# Patient Record
Sex: Female | Born: 1990 | Race: Black or African American | Hispanic: No | Marital: Single | Smoking: Never smoker
Health system: Southern US, Community
[De-identification: ages and names within clinical notes are randomized; demographics above are authoritative.]

## PROBLEM LIST (undated history)

## (undated) ENCOUNTER — Inpatient Hospital Stay (HOSPITAL_COMMUNITY): Payer: Self-pay

## (undated) DIAGNOSIS — I2699 Other pulmonary embolism without acute cor pulmonale: Secondary | ICD-10-CM

## (undated) DIAGNOSIS — O99891 Other specified diseases and conditions complicating pregnancy: Secondary | ICD-10-CM

## (undated) DIAGNOSIS — B019 Varicella without complication: Secondary | ICD-10-CM

## (undated) DIAGNOSIS — D649 Anemia, unspecified: Secondary | ICD-10-CM

## (undated) DIAGNOSIS — O9989 Other specified diseases and conditions complicating pregnancy, childbirth and the puerperium: Secondary | ICD-10-CM

## (undated) HISTORY — PX: NO PAST SURGERIES: SHX2092

## (undated) HISTORY — DX: Varicella without complication: B01.9

---
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
## 2012-04-10 LAB — OB RESULTS CONSOLE ABO/RH: RH Type: NEGATIVE

## 2012-04-10 LAB — OB RESULTS CONSOLE RUBELLA ANTIBODY, IGM: Rubella: IMMUNE

## 2012-04-10 LAB — OB RESULTS CONSOLE RPR: RPR: NONREACTIVE

## 2012-04-10 LAB — OB RESULTS CONSOLE HIV ANTIBODY (ROUTINE TESTING): HIV: NONREACTIVE

## 2012-04-10 LAB — OB RESULTS CONSOLE HEPATITIS B SURFACE ANTIGEN: Hepatitis B Surface Ag: NEGATIVE

## 2012-10-22 NOTE — L&D Delivery Note (Signed)
Delivery Note At 4:50 AM a viable and healthy female was delivered via Vaginal, Spontaneous Delivery (Presentation: Left Occiput ;Anterior  ).  APGAR: 8, 9; weight 8# 4.6.   Placenta status: Umbilical cord avulsion, velamentous cord insertion, manual extraction of the placenta. Cord:  3V with a velamentous cord insertion and loose nuchal cord x 2.  The patient began to have a increase in her temperature around midnight. At that point she was 100.3 and she received 1 g of Tylenol by mouth x1. Ampicillin 2 g IV x1 was also started at this time. At around 3 in the morning the patient spiked a temperature to 100.7 and she received gentamicin. See her thereafter she was found to be completely dilated and pushing commenced. The patient delivered a vigorous female infant in the vertex LOA presentation. With delivery of the infant's head a loose double nuchal cord was noted. The infant's body was delivered and the nuchal cord x2 was reduced after delivery. The infant cried vigorously after delivery and was placed on the maternal abdomen. The cord was clamped and cut x2. At this time attention was turned to delivery of the placenta. The cord blood was obtained. Gentle traction was placed on the umbilical cord and it was noted to easily avulse from the placenta. At this time the infant was passed to the isolette, and the body of the placenta was manually removed. The patient was noted to have some continued bleeding after manual extraction of the placenta. The uterus was massaged And clot was removed from the lower uterine segment. Given the persistent bleeding the uterus was reexplored x2 and complete removal of all placental tissue was confirmed. These misoprostol 1000 mcg was called for and placed rectally. The patient was noted to have persistent bleeding the uterus felt firm however with massage she continued to have bleeding. Hemabate was called for and administered. A stat CBC and type and cross were called for. Right  angle retractors were called for and the vagina was explored. The patient had bilateral labial lacerations which were hemostatic. No cervical lacerations were identified. She did have bilateral vaginal lacerations which were initially hemostatic, however did begin to have have some bleeding and these were repaired with 3-0 Vicryl in running fashion. In spite of these interventions the patient continued to have bleeding and a code hemorrhage was called. The usual protocol for hemorrhage was followed. The Bakri balloon was placed within the uterus and the balloon was inflated to 420 cc. The patient had persistent oozing from the vagina at this point and it was felt that this was multifocal in nature and the vagina was packed with Kerlex moistened with sterile saline. Total EBL was approximately 1000 cc. The patient's bleeding is stable at this time. She will be transferred to the AICU For continued care.  Anesthesia: Epidural  Episiotomy: None Lacerations: Bilateral labial lacerations, bilateral vaginal laceration Suture Repair: 3.0 vicryl Est. Blood Loss (mL): 1000cc  Mom to AICU.  Baby to nursery-stable.  Rachel Sanders. 11/02/2012, 6:24 AM

## 2012-10-31 ENCOUNTER — Encounter (HOSPITAL_COMMUNITY): Payer: Self-pay

## 2012-10-31 ENCOUNTER — Inpatient Hospital Stay (HOSPITAL_COMMUNITY)
Admission: AD | Admit: 2012-10-31 | Discharge: 2012-11-04 | DRG: 774 | Disposition: A | Discharge status: Home / Self Care | Payer: Medicaid Other | Source: Ambulatory Visit | Attending: Obstetrics and Gynecology | Admitting: Obstetrics and Gynecology

## 2012-10-31 DIAGNOSIS — IMO0002 Reserved for concepts with insufficient information to code with codable children: Secondary | ICD-10-CM | POA: Diagnosis present

## 2012-10-31 DIAGNOSIS — O9903 Anemia complicating the puerperium: Secondary | ICD-10-CM | POA: Diagnosis not present

## 2012-10-31 DIAGNOSIS — D649 Anemia, unspecified: Secondary | ICD-10-CM | POA: Diagnosis not present

## 2012-10-31 LAB — CBC
Hemoglobin: 9.2 g/dL — ABNORMAL LOW (ref 12.0–15.0)
MCH: 27.9 pg (ref 26.0–34.0)
MCHC: 32.7 g/dL (ref 30.0–36.0)
Platelets: 391 10*3/uL (ref 150–400)

## 2012-10-31 LAB — COMPREHENSIVE METABOLIC PANEL
Albumin: 3.2 g/dL — ABNORMAL LOW (ref 3.5–5.2)
Alkaline Phosphatase: 234 U/L — ABNORMAL HIGH (ref 39–117)
BUN: 5 mg/dL — ABNORMAL LOW (ref 6–23)
Chloride: 103 mEq/L (ref 96–112)
Creatinine, Ser: 0.59 mg/dL (ref 0.50–1.10)
GFR calc Af Amer: >90 mL/min (ref >90)
Glucose, Bld: 70 mg/dL (ref 70–99)
Sodium: 135 mEq/L (ref 135–145)
Total Bilirubin: 0.4 mg/dL (ref 0.3–1.2)
Total Protein: 6.6 g/dL (ref 6.0–8.3)

## 2012-10-31 LAB — URIC ACID: Uric Acid, Serum: 3.5 mg/dL (ref 2.4–7.0)

## 2012-10-31 LAB — LACTATE DEHYDROGENASE: LDH: 180 U/L (ref 94–250)

## 2012-10-31 LAB — POCT FERN TEST: POCT Fern Test: POSITIVE

## 2012-10-31 MED ORDER — OXYTOCIN 40 UNITS IN LACTATED RINGERS INFUSION - SIMPLE MED
62.5000 mL/h | INTRAVENOUS | Status: DC
  Administered 2012-11-02: 62.5 mL/h via INTRAVENOUS
  Filled 2012-10-31 (×2): qty 1000

## 2012-10-31 MED ORDER — LACTATED RINGERS IV SOLN
500.0000 mL | INTRAVENOUS | Status: DC | PRN
  Administered 2012-11-01 – 2012-11-02 (×2): 500 mL via INTRAVENOUS

## 2012-10-31 MED ORDER — EPHEDRINE 5 MG/ML INJ: 10.0000 mg | INTRAVENOUS | Status: DC | PRN

## 2012-10-31 MED ORDER — PHENYLEPHRINE 40 MCG/ML (10ML) SYRINGE FOR IV PUSH (FOR BLOOD PRESSURE SUPPORT)
80.0000 ug | PREFILLED_SYRINGE | INTRAVENOUS | Status: DC | PRN
  Filled 2012-10-31: qty 5

## 2012-10-31 MED ORDER — FENTANYL 2.5 MCG/ML BUPIVACAINE 1/10 % EPIDURAL INFUSION (WH - ANES)
14.0000 mL/h | INTRAMUSCULAR | Status: DC
  Administered 2012-11-01 – 2012-11-02 (×2): 14 mL/h via EPIDURAL
  Filled 2012-10-31 (×3): qty 125

## 2012-10-31 MED ORDER — DIPHENHYDRAMINE HCL 50 MG/ML IJ SOLN: 12.5000 mg | INTRAMUSCULAR | Status: DC | PRN

## 2012-10-31 MED ORDER — OXYCODONE-ACETAMINOPHEN 5-325 MG PO TABS
1.0000 | ORAL_TABLET | ORAL | Status: DC | PRN
  Filled 2012-10-31: qty 2

## 2012-10-31 MED ORDER — OXYTOCIN BOLUS FROM INFUSION: 500.0000 mL | INTRAVENOUS | Status: DC

## 2012-10-31 MED ORDER — LACTATED RINGERS IV SOLN
INTRAVENOUS | Status: DC
  Administered 2012-10-31 – 2012-11-02 (×6): via INTRAVENOUS

## 2012-10-31 MED ORDER — LACTATED RINGERS IV SOLN
500.0000 mL | Freq: Once | INTRAVENOUS | Status: AC
  Administered 2012-11-01: 15:00:00 via INTRAVENOUS

## 2012-10-31 MED ORDER — CITRIC ACID-SODIUM CITRATE 334-500 MG/5ML PO SOLN: 30.0000 mL | ORAL | Status: DC | PRN

## 2012-10-31 MED ORDER — PHENYLEPHRINE 40 MCG/ML (10ML) SYRINGE FOR IV PUSH (FOR BLOOD PRESSURE SUPPORT): 80.0000 ug | PREFILLED_SYRINGE | INTRAVENOUS | Status: DC | PRN

## 2012-10-31 MED ORDER — BUTORPHANOL TARTRATE 1 MG/ML IJ SOLN
1.0000 mg | INTRAMUSCULAR | Status: DC | PRN
  Administered 2012-11-01: 1 mg via INTRAVENOUS
  Filled 2012-10-31: qty 1

## 2012-10-31 MED ORDER — ONDANSETRON HCL 4 MG/2ML IJ SOLN
4.0000 mg | Freq: Four times a day (QID) | INTRAMUSCULAR | Status: DC | PRN
  Filled 2012-10-31: qty 2

## 2012-10-31 MED ORDER — LIDOCAINE HCL (PF) 1 % IJ SOLN
30.0000 mL | INTRAMUSCULAR | Status: DC | PRN
  Administered 2012-11-02: 30 mL via SUBCUTANEOUS
  Filled 2012-10-31: qty 30

## 2012-10-31 MED ORDER — IBUPROFEN 600 MG PO TABS: 600.0000 mg | ORAL_TABLET | Freq: Four times a day (QID) | ORAL | Status: DC | PRN

## 2012-10-31 MED ORDER — ACETAMINOPHEN 325 MG PO TABS
650.0000 mg | ORAL_TABLET | ORAL | Status: DC | PRN
  Administered 2012-10-31: 650 mg via ORAL
  Filled 2012-10-31: qty 2

## 2012-10-31 MED ORDER — EPHEDRINE 5 MG/ML INJ
10.0000 mg | INTRAVENOUS | Status: DC | PRN
  Filled 2012-10-31: qty 4

## 2012-10-31 MED ORDER — OXYTOCIN 40 UNITS IN LACTATED RINGERS INFUSION - SIMPLE MED
1.0000 m[IU]/min | INTRAVENOUS | Status: DC
  Administered 2012-10-31: 2 m[IU]/min via INTRAVENOUS
  Filled 2012-10-31: qty 1000

## 2012-10-31 MED ORDER — ZOLPIDEM TARTRATE 5 MG PO TABS: 5.0000 mg | ORAL_TABLET | Freq: Every evening | ORAL | Status: DC | PRN

## 2012-10-31 MED ORDER — TERBUTALINE SULFATE 1 MG/ML IJ SOLN: 0.2500 mg | Freq: Once | INTRAMUSCULAR | Status: AC | PRN

## 2012-10-31 NOTE — MAU Note (Signed)
Patient states she started leaking a lot of clear fluid at 1500. States she started leaking a little for a couple of days. Started having contractions every 4 minutes. Reports good fetal movement.

## 2012-10-31 NOTE — H&P (Signed)
Rachel Sanders is a 22 y.o. female presenting for leaking fluid  22 yo G1 P0 @ 40+2 p/w c/o leaking fluid and was confirmed spontaneously ruptured.  Her pregnancy has been uncomplicated to this point.  Her cervix is 2/60/-2.  Her BPs have been elevated in MAU and pre-eclampsia labs will be checked.  History OB History    Grav Para Term Preterm Abortions TAB SAB Ect Mult Living   1 0 0 0 0 0 0 0 0 0      History reviewed. No pertinent past medical history. Past Surgical History  Procedure Date  . No past surgeries    Family History: family history is not on file. Social History:  reports that she has never smoked. She has never used smokeless tobacco. She reports that she does not drink alcohol or use illicit drugs.   Prenatal Transfer Tool  Maternal Diabetes: No Genetic Screening: Normal Maternal Ultrasounds/Referrals: Normal Fetal Ultrasounds or other Referrals:  None Maternal Substance Abuse:  No Significant Maternal Medications:  None Significant Maternal Lab Results:  None Other Comments:  None  ROS: as above  Dilation: 2.5 Effacement (%): 60 Station: -3 Exam by:: S. Carrera, RNC Blood pressure 139/94, pulse 102, temperature 98.4 F (36.9 C), temperature source Oral, resp. rate 16, height 5\' 6"  (1.676 m), weight 76.839 kg (169 lb 6.4 oz). Exam Physical Exam  Prenatal labs: ABO, Rh:  A negative Antibody:  Negative Rubella:  Immune RPR:   NR HBsAg:   Neg HIV:   NR GBS: Negative (12/09 0000)   Assessment/Plan: 1) Admit 2) Check Pre-eclampsia labs for elevated BPs 3) Epidural on request 4) Augmentation with pitocin if no cervical change  Rachel Sanders H. 10/31/2012, 7:14 PM

## 2012-10-31 NOTE — MAU Provider Note (Signed)
Rachel Sanders is a 22 y.o. G1 female at [redacted]w[redacted]d who presents to MAU complaining of leaking fluid.   Performed a sterile speculum exam on patient at the request of Bobbe Medico, RN in MAU.  Minimal pooling noted. Small amount of mucus. No blood. Cervix clean.  Sample taken to evaluate for ferning under microscope.  + Crist Fat noted.  Report given to Bobbe Medico, RN in MAU to discuss with Dr. Tenny Craw.   Freddi Starr, PA-C 10/31/2012 7:12 PM

## 2012-11-01 ENCOUNTER — Inpatient Hospital Stay (HOSPITAL_COMMUNITY): Payer: Medicaid Other | Admitting: Anesthesiology

## 2012-11-01 ENCOUNTER — Encounter (HOSPITAL_COMMUNITY): Payer: Self-pay | Admitting: Anesthesiology

## 2012-11-01 LAB — CBC
MCH: 27.6 pg (ref 26.0–34.0)
MCHC: 32.4 g/dL (ref 30.0–36.0)
MCV: 85.4 fL (ref 78.0–100.0)
Platelets: 348 10*3/uL (ref 150–400)
RDW: 13.9 % (ref 11.5–15.5)

## 2012-11-01 MED ORDER — SODIUM CHLORIDE 0.9 % IV SOLN
2.0000 g | Freq: Once | INTRAVENOUS | Status: AC
  Administered 2012-11-01: 2 g via INTRAVENOUS
  Filled 2012-11-01: qty 2000

## 2012-11-01 MED ORDER — ACETAMINOPHEN 500 MG PO TABS
1000.0000 mg | ORAL_TABLET | Freq: Once | ORAL | Status: AC
  Administered 2012-11-01: 1000 mg via ORAL
  Filled 2012-11-01: qty 2

## 2012-11-01 MED ORDER — SODIUM CHLORIDE 0.9 % IV SOLN
1.0000 g | Freq: Four times a day (QID) | INTRAVENOUS | Status: DC
  Filled 2012-11-01 (×2): qty 1000

## 2012-11-01 MED ORDER — FENTANYL 2.5 MCG/ML BUPIVACAINE 1/10 % EPIDURAL INFUSION (WH - ANES)
INTRAMUSCULAR | Status: DC | PRN
  Administered 2012-11-01: 14 mL/h via EPIDURAL

## 2012-11-01 MED ORDER — LIDOCAINE HCL (PF) 1 % IJ SOLN
INTRAMUSCULAR | Status: DC | PRN
  Administered 2012-11-01 (×2): 4 mL

## 2012-11-01 NOTE — Progress Notes (Signed)
Patient ID: Rachel Sanders, female   DOB: 1991/05/26, 22 y.o.   MRN: [id number]   S: Pt comfortable without epidural.  Has had light trickling of fluid overnight.  Pitocin has been on/off throughout night due to tachysystole.   OCeasar Mons Vitals:   11/01/12 7564 11/01/12 0732 11/01/12 0803 11/01/12 0901  BP: 122/83 128/80 156/87   Pulse: 87 89 77   Temp:  98.1 F (36.7 C)    TempSrc:  Oral    Resp: 18 18 18 18   Height:      Weight:       AOx3, NAD Gravid soft NT FHR 140s reactive cvx 2-3/70/-2 toco irregular q 2-5  AROM clear fluid, IUPC placed  A/P 1) Pt now with IUPC. Ctxns every 2-3 minutes.  Will monitor for adequacy.  FWB reasssuring.  Pitocin currently at 20 2) Elevated BPs.  Pts has had some labile BPs.  Higher when she's sitting up.  Pre-e labs on admission were all WNL. 2nd CBC pending. No severe range BPs.  No intervention required at this time

## 2012-11-01 NOTE — Anesthesia Preprocedure Evaluation (Signed)

## 2012-11-01 NOTE — Progress Notes (Signed)
Patient ID: Rachel Sanders, female   DOB: [birth year], 22 y.o.   MRN: [id number]   CTSP for a prolonged deceleration.  FHTs down 7 minutes when I received the call.  Pitocin off, IVF bolus being given, supplemental O2 on. Upon arrival FHTs 120s.  Cervix exam 6/80/-1/0 station.  IUPC completely out.  IUPC replaced and FSE placed. FHR now 120-130s and reactive.  Toco Q7 minutes Pt was on 32 of pitocin prior to deceleration.  Will restart at 6 at 20:00 Pt with epidural

## 2012-11-01 NOTE — Anesthesia Procedure Notes (Signed)
Epidural Patient location during procedure: OB Start time: 11/01/2012 3:48 PM  Staffing Anesthesiologist: Aveer Bartow A. Performed by: anesthesiologist   Preanesthetic Checklist Completed: patient identified, site marked, surgical consent, pre-op evaluation, timeout performed, IV checked, risks and benefits discussed and monitors and equipment checked  Epidural Patient position: sitting Prep: site prepped and draped and DuraPrep Patient monitoring: continuous pulse ox and blood pressure Approach: midline Injection technique: LOR air  Needle:  Needle type: Tuohy  Needle gauge: 17 G Needle length: 9 cm and 9 Needle insertion depth: 4 cm Catheter type: closed end flexible Catheter size: 19 Gauge Catheter at skin depth: 9 cm Test dose: negative and Other  Assessment Events: blood not aspirated, injection not painful, no injection resistance, negative IV test and no paresthesia  Additional Notes Patient identified. Risks and benefits discussed including failed block, incomplete  Pain control, post dural puncture headache, nerve damage, paralysis, blood pressure Changes, nausea, vomiting, reactions to medications-both toxic and allergic and post Partum back pain. All questions were answered. Patient expressed understanding and wished to proceed. Sterile technique was used throughout procedure. Epidural site was Dressed with sterile barrier dressing. No paresthesias, signs of intravascular injection Or signs of intrathecal spread were encountered.  Patient was more comfortable after the epidural was dosed. Please see RN's note for documentation of vital signs and FHR which are stable.

## 2012-11-02 ENCOUNTER — Encounter (HOSPITAL_COMMUNITY): Payer: Self-pay | Admitting: Obstetrics

## 2012-11-02 ENCOUNTER — Encounter (HOSPITAL_COMMUNITY)
Admission: AD | Disposition: A | Discharge status: Home / Self Care | Payer: Self-pay | Source: Ambulatory Visit | Attending: Obstetrics and Gynecology

## 2012-11-02 LAB — CBC WITH DIFFERENTIAL/PLATELET
Basophils Absolute: 0 10*3/uL (ref 0.0–0.1)
Eosinophils Relative: 0 % (ref 0–5)
HCT: 16.8 % — ABNORMAL LOW (ref 36.0–46.0)
Lymphocytes Relative: 9 % — ABNORMAL LOW (ref 12–46)
Lymphs Abs: 2.8 10*3/uL (ref 0.7–4.0)
MCV: 85.3 fL (ref 78.0–100.0)
Neutro Abs: 25.7 10*3/uL — ABNORMAL HIGH (ref 1.7–7.7)
Platelets: 156 10*3/uL (ref 150–400)
RBC: 1.97 MIL/uL — ABNORMAL LOW (ref 3.87–5.11)
WBC: 30.7 10*3/uL — ABNORMAL HIGH (ref 4.0–10.5)

## 2012-11-02 LAB — MRSA PCR SCREENING: MRSA by PCR: NEGATIVE

## 2012-11-02 LAB — CBC
HCT: 22.4 % — ABNORMAL LOW (ref 36.0–46.0)
HCT: 19.7 % — ABNORMAL LOW (ref 36.0–46.0)
Hemoglobin: 6.7 g/dL — CL (ref 12.0–15.0)
MCHC: 33.5 g/dL (ref 30.0–36.0)
MCV: 83.8 fL (ref 78.0–100.0)
Platelets: 312 10*3/uL (ref 150–400)
RBC: 2.35 MIL/uL — ABNORMAL LOW (ref 3.87–5.11)
RDW: 13.9 % (ref 11.5–15.5)
RDW: 14.6 % (ref 11.5–15.5)
WBC: 28.6 10*3/uL — ABNORMAL HIGH (ref 4.0–10.5)
WBC: 31.2 10*3/uL — ABNORMAL HIGH (ref 4.0–10.5)

## 2012-11-02 LAB — COMPREHENSIVE METABOLIC PANEL
ALT: 11 U/L (ref 0–35)
Albumin: 1.9 g/dL — ABNORMAL LOW (ref 3.5–5.2)
Alkaline Phosphatase: 106 U/L (ref 39–117)
Calcium: 7.9 mg/dL — ABNORMAL LOW (ref 8.4–10.5)
Potassium: 3.8 mEq/L (ref 3.5–5.1)
Sodium: 133 mEq/L — ABNORMAL LOW (ref 135–145)
Total Protein: 3.9 g/dL — ABNORMAL LOW (ref 6.0–8.3)

## 2012-11-02 LAB — APTT: aPTT: 35 seconds (ref 24–37)

## 2012-11-02 LAB — PROTIME-INR: INR: 1.33 (ref 0.00–1.49)

## 2012-11-02 SURGERY — DILATION AND EVACUATION, UTERUS
Anesthesia: Choice

## 2012-11-02 MED ORDER — SODIUM BICARBONATE 8.4 % IV SOLN
INTRAVENOUS | Status: DC | PRN
  Administered 2012-11-02: 5 mL via EPIDURAL

## 2012-11-02 MED ORDER — WITCH HAZEL-GLYCERIN EX PADS: 1.0000 "application " | MEDICATED_PAD | CUTANEOUS | Status: DC | PRN

## 2012-11-02 MED ORDER — MISOPROSTOL 200 MCG PO TABS
1000.0000 ug | ORAL_TABLET | Freq: Once | ORAL | Status: AC
  Administered 2012-11-02: 1000 ug via RECTAL

## 2012-11-02 MED ORDER — OXYTOCIN 40 UNITS IN LACTATED RINGERS INFUSION - SIMPLE MED: 62.5000 mL/h | INTRAVENOUS | Status: DC

## 2012-11-02 MED ORDER — BENZOCAINE-MENTHOL 20-0.5 % EX AERO
1.0000 "application " | INHALATION_SPRAY | CUTANEOUS | Status: DC | PRN
  Administered 2012-11-02: 1 via TOPICAL
  Filled 2012-11-02: qty 56

## 2012-11-02 MED ORDER — ONDANSETRON HCL 4 MG/2ML IJ SOLN: 4.0000 mg | INTRAMUSCULAR | Status: DC | PRN

## 2012-11-02 MED ORDER — SODIUM CHLORIDE 0.9 % IJ SOLN
3.0000 mL | INTRAMUSCULAR | Status: DC | PRN
  Administered 2012-11-04: 3 mL via INTRAVENOUS

## 2012-11-02 MED ORDER — SODIUM CHLORIDE 0.9 % IV SOLN: 250.0000 mL | INTRAVENOUS | Status: DC | PRN

## 2012-11-02 MED ORDER — PRENATAL MULTIVITAMIN CH
1.0000 | ORAL_TABLET | Freq: Every day | ORAL | Status: DC
  Administered 2012-11-02 – 2012-11-04 (×3): 1 via ORAL
  Filled 2012-11-02 (×2): qty 1

## 2012-11-02 MED ORDER — CARBOPROST TROMETHAMINE 250 MCG/ML IM SOLN
250.0000 ug | Freq: Once | INTRAMUSCULAR | Status: AC
  Administered 2012-11-02: 250 ug via INTRAMUSCULAR

## 2012-11-02 MED ORDER — LACTATED RINGERS IV SOLN
INTRAVENOUS | Status: DC
  Administered 2012-11-02 – 2012-11-03 (×3): via INTRAVENOUS

## 2012-11-02 MED ORDER — ACETAMINOPHEN 500 MG PO TABS
1000.0000 mg | ORAL_TABLET | Freq: Four times a day (QID) | ORAL | Status: DC | PRN
  Administered 2012-11-02: 1000 mg via ORAL
  Filled 2012-11-02: qty 2

## 2012-11-02 MED ORDER — SENNOSIDES-DOCUSATE SODIUM 8.6-50 MG PO TABS
2.0000 | ORAL_TABLET | Freq: Every day | ORAL | Status: DC
  Administered 2012-11-02 – 2012-11-04 (×2): 2 via ORAL

## 2012-11-02 MED ORDER — IBUPROFEN 600 MG PO TABS
600.0000 mg | ORAL_TABLET | Freq: Four times a day (QID) | ORAL | Status: DC
  Administered 2012-11-02 – 2012-11-04 (×8): 600 mg via ORAL
  Filled 2012-11-02 (×9): qty 1

## 2012-11-02 MED ORDER — CARBOPROST TROMETHAMINE 250 MCG/ML IM SOLN
INTRAMUSCULAR | Status: AC
  Administered 2012-11-02: 250 ug via INTRAMUSCULAR
  Filled 2012-11-02: qty 1

## 2012-11-02 MED ORDER — LANOLIN HYDROUS EX OINT: TOPICAL_OINTMENT | CUTANEOUS | Status: DC | PRN

## 2012-11-02 MED ORDER — GENTAMICIN SULFATE 40 MG/ML IJ SOLN
200.0000 mg | Freq: Once | INTRAVENOUS | Status: AC
  Administered 2012-11-02: 200 mg via INTRAVENOUS
  Filled 2012-11-02: qty 5

## 2012-11-02 MED ORDER — ZOLPIDEM TARTRATE 5 MG PO TABS: 5.0000 mg | ORAL_TABLET | Freq: Every evening | ORAL | Status: DC | PRN

## 2012-11-02 MED ORDER — SODIUM CHLORIDE 0.9 % IJ SOLN
3.0000 mL | Freq: Two times a day (BID) | INTRAMUSCULAR | Status: DC
  Administered 2012-11-02: 3 mL via INTRAVENOUS

## 2012-11-02 MED ORDER — CEFAZOLIN SODIUM 1-5 GM-% IV SOLN
1.0000 g | Freq: Three times a day (TID) | INTRAVENOUS | Status: DC
  Administered 2012-11-02 – 2012-11-03 (×6): 1 g via INTRAVENOUS
  Filled 2012-11-02 (×9): qty 50

## 2012-11-02 MED ORDER — MISOPROSTOL 200 MCG PO TABS
ORAL_TABLET | ORAL | Status: AC
  Administered 2012-11-02: 1000 ug via RECTAL
  Filled 2012-11-02: qty 5

## 2012-11-02 MED ORDER — DIBUCAINE 1 % RE OINT: 1.0000 "application " | TOPICAL_OINTMENT | RECTAL | Status: DC | PRN

## 2012-11-02 MED ORDER — SIMETHICONE 80 MG PO CHEW: 80.0000 mg | CHEWABLE_TABLET | ORAL | Status: DC | PRN

## 2012-11-02 MED ORDER — LACTATED RINGERS IV BOLUS (SEPSIS)
500.0000 mL | Freq: Once | INTRAVENOUS | Status: AC
  Administered 2012-11-02: 500 mL via INTRAVENOUS

## 2012-11-02 MED ORDER — OXYCODONE-ACETAMINOPHEN 5-325 MG PO TABS
1.0000 | ORAL_TABLET | ORAL | Status: DC | PRN
  Administered 2012-11-02: 1 via ORAL
  Administered 2012-11-03 (×2): 2 via ORAL
  Filled 2012-11-02: qty 1
  Filled 2012-11-02 (×2): qty 2

## 2012-11-02 MED ORDER — ONDANSETRON HCL 4 MG PO TABS: 4.0000 mg | ORAL_TABLET | ORAL | Status: DC | PRN

## 2012-11-02 MED ORDER — OXYCODONE HCL 5 MG PO TABS
10.0000 mg | ORAL_TABLET | ORAL | Status: DC | PRN
  Administered 2012-11-02: 10 mg via ORAL
  Filled 2012-11-02: qty 2

## 2012-11-02 MED ORDER — TETANUS-DIPHTH-ACELL PERTUSSIS 5-2.5-18.5 LF-MCG/0.5 IM SUSP
0.5000 mL | Freq: Once | INTRAMUSCULAR | Status: AC
  Administered 2012-11-03: 0.5 mL via INTRAMUSCULAR
  Filled 2012-11-02: qty 0.5

## 2012-11-02 MED ORDER — GENTAMICIN SULFATE 40 MG/ML IJ SOLN
170.0000 mg | Freq: Three times a day (TID) | INTRAVENOUS | Status: AC
  Administered 2012-11-02 – 2012-11-03 (×3): 170 mg via INTRAVENOUS
  Filled 2012-11-02 (×3): qty 4.25

## 2012-11-02 MED ORDER — DIPHENHYDRAMINE HCL 25 MG PO CAPS: 25.0000 mg | ORAL_CAPSULE | Freq: Four times a day (QID) | ORAL | Status: DC | PRN

## 2012-11-02 NOTE — Anesthesia Postprocedure Evaluation (Signed)
  Anesthesia Post-op Note  Patient: Rachel Sanders  Procedure(s) Performed: * No procedures listed *  Patient Location: A-ICU  Anesthesia Type:Epidural  Level of Consciousness: awake  Airway and Oxygen Therapy: Patient Spontanous Breathing  Post-op Pain: mild  Post-op Assessment: Patient's Cardiovascular Status Stable and Respiratory Function Stable  Post-op Vital Signs: stable  Complications: No apparent anesthesia complications

## 2012-11-02 NOTE — Progress Notes (Signed)
ANTIBIOTIC CONSULT NOTE - INITIAL  Pharmacy Consult for gentamicin Indication: maternal fever  No Known Allergies  Patient Measurements: Height: 5\' 6"  (167.6 cm) Weight: 169 lb 6.4 oz (76.839 kg) IBW/kg (Calculated) : 59.3  Adjusted Body Weight: 65.1 kg  Vital Signs: Temp: 100.7 F (38.2 C) (01/12 0301) Temp src: Oral (01/12 0301) BP: 136/79 mmHg (01/12 0320) Pulse Rate: 128  (01/12 0320) Intake/Output from previous day: 01/11 0701 - 01/12 0700 In: 1700  Out: -  Intake/Output from this shift: Total I/O In: 1700 [Other:1700] Out: -   Labs:  Basename 11/01/12 1014 10/31/12 1934  WBC 10.0 10.1  HGB 8.9* 9.2*  PLT 348 391  LABCREA -- --  CREATININE -- 0.59   Estimated Creatinine Clearance: 118.4 ml/min (by C-G formula based on Cr of 0.59).    Microbiology: No results found for this or any previous visit (from the past 720 hour(s)).  Medical History: Past Medical History  Diagnosis Date  . No pertinent past medical history     Medications:  Scheduled:    . [COMPLETED] acetaminophen  1,000 mg Oral Once  . ampicillin (OMNIPEN) IV  1 g Intravenous Q6H  . [COMPLETED] ampicillin (OMNIPEN) IV  2 g Intravenous Once  . [COMPLETED] lactated ringers  500 mL Intravenous Once   Infusions:    . fentaNYL 2.5 mcg/ml w/bupivacaine 1/10% in NS epidural infusion ( total) 14 mL/hr (11/02/12 0211)  . lactated ringers 125 mL/hr at 11/02/12 0216  . oxytocin 40 units in LR 1000 mL    . oxytocin 40 units in LR 1000 mL    . oxytocin 40 units in LR 1000 mL 12 milli-units/min (11/01/12 2331)   Assessment: 22yr old term G1P0 now with maternal fever already on Ampicillin IV.  Cover for possible infection of pelvic origin - R/O chorioamnionitis  Goal of Therapy:  Desire peak gentamicin serum level 7-56mcg/ml and trough <9mcg/ml  Plan:  1. Loading dose gentamicin 200mg  x 1, then 2. Maintenance infusion 170mg  gentamicin IV q8h (if tx continued) 3. Will measure actual  serum levels if tx >48-72hr or as clinically needed  Scarlett Presto 11/02/2012,3:41 AM

## 2012-11-03 LAB — PREPARE FRESH FROZEN PLASMA: Unit division: 0

## 2012-11-03 LAB — CBC
Hemoglobin: 6.4 g/dL — CL (ref 12.0–15.0)
MCH: 29 pg (ref 26.0–34.0)
Platelets: 125 10*3/uL — ABNORMAL LOW (ref 150–400)
RBC: 2.21 MIL/uL — ABNORMAL LOW (ref 3.87–5.11)
WBC: 26.8 10*3/uL — ABNORMAL HIGH (ref 4.0–10.5)

## 2012-11-03 LAB — APTT: aPTT: 32 seconds (ref 24–37)

## 2012-11-03 LAB — PROTIME-INR: INR: 1.13 (ref 0.00–1.49)

## 2012-11-03 MED ORDER — RHO D IMMUNE GLOBULIN 1500 UNIT/2ML IJ SOLN
300.0000 ug | Freq: Once | INTRAMUSCULAR | Status: AC
  Administered 2012-11-03: 300 ug via INTRAMUSCULAR
  Filled 2012-11-03: qty 2

## 2012-11-03 NOTE — Progress Notes (Signed)
  Pt is s/p 3 units of pRBCs and 2 units of FFP for PP hemorrhage Hgb 6.7 after 3 units Currently with bakri ballon in the uterus with 420cc of saline and vaginal packing Plan to D/C vag pack and bakri balloon in am UOP has been borderline.  Did respond to 500cc bolus of LR and 3rd unit blood  A/P 1) PP Hemorrhage: Recheck CBC and coags in am.  D/C vaginal pack and Bakri balloon in am.   2) UOP: if drops below 30cc/hr will strongly consider transfusing 4th unit of blood

## 2012-11-03 NOTE — Progress Notes (Signed)
PPD#1 Pt c/o fatigue. Bleeding is light. Urine output improved.  IMP/ stable Plan/ Will start to remove balloon as per Dr. Tenny Craw.

## 2012-11-03 NOTE — Progress Notes (Signed)
Dr. Dareen Piano present. The remaining 160cc of saline removed from Bakri Balloon and then d/c'd.  Lochia noted to be WDL's.  Fundus firm, midline at UE. Pt tolerated well. Foley catheter also removed.

## 2012-11-03 NOTE — Progress Notes (Signed)
Reported recent BP's, status of Bakri Balloon deflation and questioned whether labs were needed for in the am.  No new orders received. Will continue to deflate as instructed. MD made aware  that he must be present when balloon is removed. He stated that he would take it out in the am, "if it doesn't fall out on it's own".  Will monitor lochia closely and call MD with changes. Pt made aware of POC.

## 2012-11-04 LAB — TYPE AND SCREEN
ABO/RH(D): A NEG
Antibody Screen: POSITIVE
Unit division: 0
Unit division: 0
Unit division: 0
Unit division: 0
Unit division: 0

## 2012-11-04 LAB — CBC
Hemoglobin: 6.6 g/dL — CL (ref 12.0–15.0)
Platelets: 152 10*3/uL (ref 150–400)
RBC: 2.34 MIL/uL — ABNORMAL LOW (ref 3.87–5.11)
WBC: 21 10*3/uL — ABNORMAL HIGH (ref 4.0–10.5)

## 2012-11-04 LAB — RH IG WORKUP (INCLUDES ABO/RH)
ABO/RH(D): A NEG
Unit division: 0

## 2012-11-04 NOTE — Progress Notes (Signed)
Patient given her discharge instructions and questions answered. Nursery RN to give patient baby's discharge instructions and teaching. Patient, FOB, and baby taken down to Nursery by [place], house coverage for discharge.

## 2012-11-04 NOTE — Progress Notes (Signed)
Post discharge review completed. 

## 2012-11-04 NOTE — Discharge Summary (Signed)
Obstetric Discharge Summary Reason for Admission: rupture of membranes Prenatal Procedures: ultrasound Intrapartum Procedures: spontaneous vaginal delivery and uterine exploration Postpartum Procedures: transfusion 3 units PRBC and 2 units of Plasma, manual removal of placenta, placement of Bakri intrauterine balloon, 1mg  of cytotec per rectum. Complications-Operative and Postpartum: hemorrhage, bilateral sulcus tears - repaired. Hemoglobin  Date Value Range Status  11/04/2012 6.6* 12.0 - 15.0 g/dL Final     REPEATED TO VERIFY     CRITICAL RESULT CALLED TO, READ BACK BY AND VERIFIED WITH:     P BRADY 11/04/12 AT 0540 BY H SOEWARDIMAN     HCT  Date Value Range Status  11/04/2012 19.7* 36.0 - 46.0 % Final    Physical Exam:  BP 150/90 range General: alert Lochia: appropriate Uterine Fundus: firm  Discharge Diagnoses: Term Pregnancy-delivered, Preelampsia and PP hemorrhage, Anemia.  Discharge Information: Date: 11/04/2012 Activity: pelvic rest Diet: routine Medications: PNV and Ibuprofen Condition: stable Instructions: refer to practice specific booklet Discharge to: home, return to office in 1 week for blood pressure check (and infant circumcision). Follow-up Information    Follow up with Mickel Baas, MD. Schedule an appointment as soon as possible for a visit in 1 week.   Contact information:   [address] [place] [address] [phone]          Newborn Data: Live born female  Birth Weight: 8 lb 4.6 oz (3759 g) APGAR: 8, 9  Home with mother.  Rachel Sanders 11/04/2012, 9:45 AM

## 2012-11-12 ENCOUNTER — Ambulatory Visit (HOSPITAL_COMMUNITY): Payer: MEDICAID

## 2013-01-19 DIAGNOSIS — J3489 Other specified disorders of nose and nasal sinuses: Secondary | ICD-10-CM | POA: Insufficient documentation

## 2013-01-19 DIAGNOSIS — H109 Unspecified conjunctivitis: Secondary | ICD-10-CM | POA: Insufficient documentation

## 2013-01-20 ENCOUNTER — Emergency Department (HOSPITAL_COMMUNITY)
Admission: EM | Admit: 2013-01-20 | Discharge: 2013-01-20 | Disposition: A | Discharge status: Home / Self Care | Payer: 59 | Attending: Emergency Medicine | Admitting: Emergency Medicine

## 2013-01-20 DIAGNOSIS — H109 Unspecified conjunctivitis: Secondary | ICD-10-CM

## 2013-01-20 MED ORDER — ERYTHROMYCIN 5 MG/GM OP OINT: TOPICAL_OINTMENT | Freq: Four times a day (QID) | OPHTHALMIC | Status: DC

## 2013-01-20 MED ORDER — ERYTHROMYCIN 5 MG/GM OP OINT
TOPICAL_OINTMENT | Freq: Once | OPHTHALMIC | Status: AC
Start: 2013-01-20 — End: 2013-01-20
  Administered 2013-01-20: 03:00:00 via OPHTHALMIC
  Filled 2013-01-20: qty 3.5

## 2013-01-20 NOTE — ED Provider Notes (Signed)
Medical screening examination/treatment/procedure(s) were performed by non-physician practitioner and as supervising physician I was immediately available for consultation/collaboration.   Lyanne Co, MD 01/20/13 [phone]

## 2013-01-20 NOTE — ED Provider Notes (Signed)
History     CSN: [id number]  Arrival date & time 01/19/13  2347   First MD Initiated Contact with Patient 01/20/13 [phone]      Chief Complaint  Patient presents with  . Eye Drainage    (Consider location/radiation/quality/duration/timing/severity/associated sxs/prior treatment) HPI Comments: Patient presents with 2 days of left eyelid swelling, exudate.  She, states her son had pinkeye last week.  This pain and swelling started this morning, followed by rhinitis has not had any pain or photophobia of the eye itself  The history is provided by the patient.    Past Medical History  Diagnosis Date  . No pertinent past medical history     Past Surgical History  Procedure Laterality Date  . No past surgeries      No family history on file.  History  Substance Use Topics  . Smoking status: Never Smoker   . Smokeless tobacco: Never Used  . Alcohol Use: No    OB History   Grav Para Term Preterm Abortions TAB SAB Ect Mult Living   1 1 1  0 0 0 0 0 0 1      Review of Systems  HENT: Positive for rhinorrhea.   Eyes: Positive for discharge. Negative for photophobia, pain and visual disturbance.  All other systems reviewed and are negative.    Allergies  Review of patient's allergies indicates no known allergies.  Home Medications   Current Outpatient Rx  Name  Route  Sig  Dispense  Refill  . Prenatal Vit-Fe Fumarate-FA (PRENATAL MULTIVITAMIN) TABS   Oral   Take 1 tablet by mouth daily.         Marland Kitchen erythromycin ophthalmic ointment   Left Eye   Place into the left eye every 6 (six) hours.   3.5 g   0     BP 129/77  Pulse 80  Temp(Src) 97.8 F (36.6 C) (Oral)  Resp 16  Wt 152 lb (68.947 kg)  BMI 24.55 kg/m2  SpO2 98%  Physical Exam  Constitutional: She appears well-developed and well-nourished.  HENT:  Head: Normocephalic and atraumatic.  Left Ear: External ear normal.  Mouth/Throat: Oropharynx is clear and moist.  Eyes: Pupils are equal, round, and  reactive to light. Left eye exhibits discharge. Left conjunctiva is not injected. Left conjunctiva has no hemorrhage. No scleral icterus. Left eye exhibits normal extraocular motion and no nystagmus.  Upper and lower lids swollen, slightly erythematous  Cardiovascular: Normal rate.   Pulmonary/Chest: Effort normal.  Musculoskeletal: Normal range of motion.  Skin: Skin is warm. There is erythema.    ED Course  Procedures (including critical care time)  Labs Reviewed - No data to display No results found.   1. Conjunctivitis       MDM   Explained hand washing, use of the original minus, and I ointment, the need for followup with ophthalmology        Arman Filter, NP 01/20/13 [phone]

## 2013-01-20 NOTE — ED Notes (Signed)
Pt has swollen, pink, yellow exudate covered L eye. States she has been around her son who has pink eye.

## 2013-05-20 ENCOUNTER — Other Ambulatory Visit: Payer: Self-pay | Admitting: Obstetrics and Gynecology

## 2013-10-27 ENCOUNTER — Encounter (HOSPITAL_COMMUNITY): Payer: Self-pay | Admitting: Emergency Medicine

## 2013-10-27 ENCOUNTER — Emergency Department (HOSPITAL_COMMUNITY)
Admission: EM | Admit: 2013-10-27 | Discharge: 2013-10-27 | Disposition: A | Discharge status: Home / Self Care | Payer: 59 | Attending: Emergency Medicine | Admitting: Emergency Medicine

## 2013-10-27 DIAGNOSIS — J111 Influenza due to unidentified influenza virus with other respiratory manifestations: Secondary | ICD-10-CM | POA: Insufficient documentation

## 2013-10-27 DIAGNOSIS — IMO0001 Reserved for inherently not codable concepts without codable children: Secondary | ICD-10-CM | POA: Insufficient documentation

## 2013-10-27 DIAGNOSIS — R110 Nausea: Secondary | ICD-10-CM | POA: Insufficient documentation

## 2013-10-27 DIAGNOSIS — M549 Dorsalgia, unspecified: Secondary | ICD-10-CM | POA: Insufficient documentation

## 2013-10-27 DIAGNOSIS — R5381 Other malaise: Secondary | ICD-10-CM | POA: Insufficient documentation

## 2013-10-27 DIAGNOSIS — Z3202 Encounter for pregnancy test, result negative: Secondary | ICD-10-CM | POA: Insufficient documentation

## 2013-10-27 DIAGNOSIS — R5383 Other fatigue: Secondary | ICD-10-CM

## 2013-10-27 LAB — URINALYSIS, ROUTINE W REFLEX MICROSCOPIC
BILIRUBIN URINE: NEGATIVE
Glucose, UA: NEGATIVE mg/dL
HGB URINE DIPSTICK: NEGATIVE
KETONES UR: NEGATIVE mg/dL
Leukocytes, UA: NEGATIVE
Nitrite: NEGATIVE
PROTEIN: NEGATIVE mg/dL
Specific Gravity, Urine: 1.013 (ref 1.005–1.030)
UROBILINOGEN UA: 0.2 mg/dL (ref 0.0–1.0)
pH: 7 (ref 5.0–8.0)

## 2013-10-27 LAB — POCT PREGNANCY, URINE: Preg Test, Ur: NEGATIVE

## 2013-10-27 MED ORDER — ONDANSETRON HCL 4 MG PO TABS: 4.0000 mg | ORAL_TABLET | Freq: Four times a day (QID) | ORAL | Status: DC

## 2013-10-27 NOTE — ED Notes (Signed)
Pt is c/o back pain, headache, and pain in her abdomen

## 2013-10-27 NOTE — ED Notes (Signed)
Pt from home c/o back pain, headache and fever since yesterday. No urinary symptoms present. Runny nose present.

## 2013-10-27 NOTE — Discharge Instructions (Signed)

## 2013-10-27 NOTE — ED Provider Notes (Signed)
CSN: [id number]     Arrival date & time 10/27/13  2027 History   First MD Initiated Contact with Patient 10/27/13 2215     Chief Complaint  Patient presents with  . Back Pain  . Fever   (Consider location/radiation/quality/duration/timing/severity/associated sxs/prior Treatment) Patient is a 23 y.o. female presenting with flu symptoms. The history is provided by the patient. No language interpreter was used.  Influenza Presenting symptoms: fatigue, fever, headache, myalgias, nausea and rhinorrhea   Presenting symptoms: no cough, no diarrhea, no shortness of breath, no sore throat and no vomiting   Fever:    Duration:  2 days   Timing:  Intermittent   Temp source:  Subjective Severity:  Moderate Onset quality:  Gradual Duration:  2 days Progression:  Waxing and waning Chronicity:  New Relieved by:  Nothing Worsened by:  Nothing tried Ineffective treatments:  None tried Associated symptoms: chills, decreased appetite, decreased physical activity and nasal congestion   Associated symptoms: no mental status change, no neck stiffness and no witnessed syncope   Risk factors: sick contacts   Risk factors: no immunocompromised state     Past Medical History  Diagnosis Date  . No pertinent past medical history    Past Surgical History  Procedure Laterality Date  . No past surgeries     History reviewed. No pertinent family history. History  Substance Use Topics  . Smoking status: Never Smoker   . Smokeless tobacco: Never Used  . Alcohol Use: No   OB History   Grav Para Term Preterm Abortions TAB SAB Ect Mult Living   1 1 1  0 0 0 0 0 0 1     Review of Systems  Constitutional: Positive for fever, chills, activity change, appetite change, fatigue and decreased appetite. Negative for diaphoresis.  HENT: Positive for congestion and rhinorrhea. Negative for facial swelling and sore throat.   Eyes: Negative for photophobia and discharge.  Respiratory: Negative for cough, chest  tightness and shortness of breath.   Cardiovascular: Negative for chest pain, palpitations and leg swelling.  Gastrointestinal: Positive for nausea. Negative for vomiting, abdominal pain and diarrhea.  Endocrine: Negative for polydipsia and polyuria.  Genitourinary: Negative for dysuria, frequency, difficulty urinating and pelvic pain.  Musculoskeletal: Positive for myalgias. Negative for arthralgias, back pain, neck pain and neck stiffness.  Skin: Negative for color change and wound.  Allergic/Immunologic: Negative for immunocompromised state.  Neurological: Positive for headaches. Negative for facial asymmetry, weakness and numbness.  Hematological: Does not bruise/bleed easily.  Psychiatric/Behavioral: Negative for confusion and agitation.    Allergies  Review of patient's allergies indicates no known allergies.  Home Medications   Current Outpatient Rx  Name  Route  Sig  Dispense  Refill  . acetaminophen (TYLENOL) 500 MG tablet   Oral   Take 1,000 mg by mouth every 6 (six) hours as needed for fever or headache.          BP 129/72  Pulse 113  Temp(Src) 99.1 F (37.3 C) (Oral)  Resp 18  Ht 5\' 5"  (1.651 m)  Wt 150 lb (68.04 kg)  BMI 24.96 kg/m2  SpO2 100%  LMP 10/19/2013 Physical Exam  Constitutional: Rachel Sanders is oriented to person, place, and time. Rachel Sanders appears well-developed and well-nourished. No distress.  HENT:  Head: Normocephalic and atraumatic.  Mouth/Throat: No oropharyngeal exudate.  Eyes: Pupils are equal, round, and reactive to light.  Neck: Normal range of motion. Neck supple.  Cardiovascular: Normal rate, regular rhythm and normal heart sounds.  Exam reveals no gallop and no friction rub.   No murmur heard. Pulmonary/Chest: Effort normal and breath sounds normal. No respiratory distress. Rachel Sanders has no wheezes. Rachel Sanders has no rales.  Abdominal: Soft. Bowel sounds are normal. Rachel Sanders exhibits no distension and no mass. There is no tenderness. There is no rebound and no  guarding.  Musculoskeletal: Normal range of motion. Rachel Sanders exhibits no edema and no tenderness.  Neurological: Rachel Sanders is alert and oriented to person, place, and time.  Skin: Skin is warm and dry.  Psychiatric: Rachel Sanders has a normal mood and affect.    ED Course  Procedures (including critical care time) Labs Review Labs Reviewed  URINALYSIS, ROUTINE W REFLEX MICROSCOPIC  POCT PREGNANCY, URINE   Imaging Review No results found.  EKG Interpretation   None       MDM  No diagnosis found. SUBJECTIVE:  Rachel Sanders is a 23 y.o. female who present complaining of flu-like symptoms: fevers, chills, myalgias, congestion,rhinorrhea, nausea w/o vomiting for 2 days. Denies dyspnea or wheezing.  OBJECTIVE: Appears moderately ill but not toxic; temperature as noted in vitals. Ears normal. Throat and pharynx normal.  Neck supple. No adenopathy in the neck. Sinuses non tender. The chest is clear. POC preg negative, UA was negative.   ASSESSMENT: Influenza  PLAN: Symptomatic therapy suggested: rest, increase fluids and OTC acetaminophen, ibuprofen. Call or return to clinic prn if these symptoms worsen or fail to improve as anticipated.     Shanna Cisco, MD 10/27/13 [phone]

## 2013-11-11 ENCOUNTER — Encounter (HOSPITAL_COMMUNITY): Payer: Self-pay | Admitting: Emergency Medicine

## 2013-11-11 ENCOUNTER — Emergency Department (HOSPITAL_COMMUNITY)
Admission: EM | Admit: 2013-11-11 | Discharge: 2013-11-12 | Disposition: A | Discharge status: Home / Self Care | Payer: 59 | Attending: Emergency Medicine | Admitting: Emergency Medicine

## 2013-11-11 DIAGNOSIS — Y9389 Activity, other specified: Secondary | ICD-10-CM | POA: Insufficient documentation

## 2013-11-11 DIAGNOSIS — X12XXXA Contact with other hot fluids, initial encounter: Secondary | ICD-10-CM | POA: Insufficient documentation

## 2013-11-11 DIAGNOSIS — T25229A Burn of second degree of unspecified foot, initial encounter: Secondary | ICD-10-CM | POA: Insufficient documentation

## 2013-11-11 DIAGNOSIS — Y929 Unspecified place or not applicable: Secondary | ICD-10-CM | POA: Insufficient documentation

## 2013-11-11 DIAGNOSIS — Z23 Encounter for immunization: Secondary | ICD-10-CM | POA: Insufficient documentation

## 2013-11-11 DIAGNOSIS — X131XXA Other contact with steam and other hot vapors, initial encounter: Secondary | ICD-10-CM
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
## 2013-11-12 MED ORDER — OXYCODONE-ACETAMINOPHEN 5-325 MG PO TABS: 1.0000 | ORAL_TABLET | ORAL | Status: DC | PRN

## 2013-11-12 MED ORDER — OXYCODONE-ACETAMINOPHEN 5-325 MG PO TABS
2.0000 | ORAL_TABLET | Freq: Once | ORAL | Status: AC
  Administered 2013-11-12: 2 via ORAL
  Filled 2013-11-12: qty 2

## 2013-11-12 MED ORDER — SILVER SULFADIAZINE 1 % EX CREA
TOPICAL_CREAM | Freq: Once | CUTANEOUS | Status: DC
  Filled 2013-11-12: qty 50

## 2013-11-12 MED ORDER — SILVER SULFADIAZINE 1 % EX CREA: 1.0000 "application " | TOPICAL_CREAM | Freq: Every day | CUTANEOUS | Status: DC

## 2013-11-12 MED ORDER — TETANUS-DIPHTH-ACELL PERTUSSIS 5-2.5-18.5 LF-MCG/0.5 IM SUSP
0.5000 mL | Freq: Once | INTRAMUSCULAR | Status: AC
  Administered 2013-11-12: 0.5 mL via INTRAMUSCULAR
  Filled 2013-11-12: qty 0.5

## 2013-11-12 NOTE — ED Notes (Signed)
Wounds dressed with silvadene cream, telfa gauze and kerlix wrap. Pt instructed on further wound care.

## 2013-11-12 NOTE — Discharge Instructions (Signed)

## 2013-11-12 NOTE — ED Notes (Signed)
Pt has a ride home.  
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
## 2013-11-12 NOTE — ED Provider Notes (Signed)
Medical screening examination/treatment/procedure(s) were performed by non-physician practitioner and as supervising physician I was immediately available for consultation/collaboration.  EKG Interpretation   None         Loren Raceravid Emersynn Deatley, MD 11/12/13 [phone]

## 2013-11-16 ENCOUNTER — Emergency Department (HOSPITAL_COMMUNITY)
Admission: EM | Admit: 2013-11-16 | Discharge: 2013-11-16 | Disposition: A | Discharge status: Home / Self Care | Payer: 59 | Attending: Emergency Medicine | Admitting: Emergency Medicine

## 2013-11-16 ENCOUNTER — Encounter (HOSPITAL_COMMUNITY): Payer: Self-pay | Admitting: Emergency Medicine

## 2013-11-16 DIAGNOSIS — Z792 Long term (current) use of antibiotics: Secondary | ICD-10-CM | POA: Insufficient documentation

## 2013-11-16 DIAGNOSIS — Y93G9 Activity, other involving cooking and grilling: Secondary | ICD-10-CM | POA: Insufficient documentation

## 2013-11-16 DIAGNOSIS — T25221A Burn of second degree of right foot, initial encounter: Secondary | ICD-10-CM

## 2013-11-16 DIAGNOSIS — X12XXXA Contact with other hot fluids, initial encounter: Secondary | ICD-10-CM | POA: Insufficient documentation

## 2013-11-16 DIAGNOSIS — T25229A Burn of second degree of unspecified foot, initial encounter: Secondary | ICD-10-CM | POA: Insufficient documentation

## 2013-11-16 DIAGNOSIS — X131XXA Other contact with steam and other hot vapors, initial encounter: Secondary | ICD-10-CM

## 2013-11-16 DIAGNOSIS — T25222A Burn of second degree of left foot, initial encounter: Secondary | ICD-10-CM

## 2013-11-16 DIAGNOSIS — Y92009 Unspecified place in unspecified non-institutional (private) residence as the place of occurrence of the external cause: Secondary | ICD-10-CM | POA: Insufficient documentation

## 2013-11-16 MED ORDER — HYDROCODONE-ACETAMINOPHEN 5-325 MG PO TABS: 1.0000 | ORAL_TABLET | ORAL | Status: DC | PRN

## 2013-11-16 MED ORDER — HYDROCODONE-ACETAMINOPHEN 5-325 MG PO TABS
1.0000 | ORAL_TABLET | Freq: Once | ORAL | Status: AC
  Administered 2013-11-16: 1 via ORAL
  Filled 2013-11-16: qty 1
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
## 2013-11-16 NOTE — ED Provider Notes (Signed)
Medical screening examination/treatment/procedure(s) were performed by non-physician practitioner and as supervising physician I was immediately available for consultation/collaboration.  EKG Interpretation   None         Isabele Lollar N Arval Brandstetter, DO 11/16/13 2257 

## 2013-11-16 NOTE — Progress Notes (Signed)
[facility] provided pt with a list of primary care resources and [facility] information.  °
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
## 2013-11-16 NOTE — Discharge Instructions (Signed)
Burn Care Your skin is a natural barrier to infection. It is the largest organ of your body. Burns damage this natural protection. To help prevent infection, it is very important to follow your caregiver's instructions in the care of your burn. Burns are classified as:  First degree. There is only redness of the skin (erythema). No scarring is expected.  Second degree. There is blistering of the skin. Scarring may occur with deeper burns.  Third degree. All layers of the skin are injured, and scarring is expected. HOME CARE INSTRUCTIONS   Wash your hands well before changing your bandage.  Change your bandage as often as directed by your caregiver.  Remove the old bandage. If the bandage sticks, you may soak it off with cool, clean water.  Cleanse the burn thoroughly but gently with mild soap and water.  Pat the area dry with a clean, dry cloth.  Apply a thin layer of antibacterial cream to the burn.  Apply a clean bandage as instructed by your caregiver.  Keep the bandage as clean and dry as possible.  Elevate the affected area for the first 24 hours, then as instructed by your caregiver.  Only take over-the-counter or prescription medicines for pain, discomfort, or fever as directed by your caregiver. SEEK IMMEDIATE MEDICAL CARE IF:   You develop excessive pain.  You develop redness, tenderness, swelling, or red streaks near the burn.  The burned area develops yellowish-white fluid (pus) or a bad smell.  You have a fever. MAKE SURE YOU:   Understand these instructions.  Will watch your condition.  Will get help right away if you are not doing well or get worse. Document Released: 10/08/2005 Document Revised: 12/31/2011 Document Reviewed: 02/28/2011 [facility] Patient Information 2014 [place], [place].  Second-Degree Burn A second-degree burn affects the 2 outer layers of skin. The outer layer (epidermis) and the layer underneath it (dermis) are both burned. Another  name for this type of burn is a partial thickness burn. A second-degree burn may be called minor or major. This depends on the size of the burn. It also depends on what parts of the skin are burned. Minor burns may be treated with first aid. Major burns are a medical emergency. A second-degree burn is worse than a first-degree burn, but not as bad as a third-degree burn. A first-degree burn affects only the epidermis. A third-degree burn goes through all the layers of skin. A second-degree burn usually heals in 3 to 4 weeks. A minor second-degree burn usually does not leave a scar.Deeper second-degree burns may lead to scarring of the skin or contractures over joints.Contractures are scars that form over joints and may lead to reduced mobility at those joints. CAUSES  Heat (thermal) injury. This happens when skin comes in contact with something very hot. It could be a flame, a hot object, hot liquid, or steam. Most second-degree burns are thermal injuries.  Radiation. Sunlight is one type of radiation that can burn the skin. Another type of radiation is used to heat food. Radiation is also used to treat some diseases, such as cancer. All types of radiation can burn the skin. Sunlight usually causes a first-degree burn. Radiation used for heating food or treating a disease can cause a second-degree burn.  Electricity. Electrical burns can cause more damage under the skin than on the surface. They should always be treated as major burns.  Chemicals. Many chemicals can burn the skin. The burn should be flushed with cool water and checked  by an emergency caregiver. SYMPTOMS Symptoms of second-degree burns include:  Severe pain.  Extreme tenderness.  Deep redness.  Blistered skin.  Skin that has changed color.It might look blotchy, wet, or shiny.  Swelling. TREATMENT Some second-degree burns may need to be treated in a hospital. These include major burns, electrical burns, and chemical burns.  Many other second-degree burns can be treated with regular first aid, such as:  Cooling the burn. Use cool, germ-free (sterile) salt water. Place the burned area of skin into a tub of water, or cover the burned area with clean, wet towels.  Taking pain medicine.  Removing the dead skin from broken blisters. A trained caregiver may do this. Do not pop blisters.  Gently washing your skin with mild soap.  Covering the burned area with a cream.Silver sulfadiazine is a cream for burns. An antibiotic cream, such as bacitracin, may also be used to fight infection. Do not use other ointments or creams unless your caregiver says it is okay.  Protecting the burn with a sterile, non-sticky bandage.  Bandaging fingers and toes separately. This keeps them from sticking together.  Taking an antibiotic. This can help prevent infection.  Getting a tetanus shot. HOME CARE INSTRUCTIONS Medication  Take any medicine prescribed by your caregiver. Follow the directions carefully.  Ask your caregiver if you can take over-the-counter medicine to relieve pain and swelling. Do not give aspirin to children.  Make sure your caregiver knows about all other medicines you take.This includes over-the-counter medicines. Burn care  You will need to change the bandage on your burn. You may need to do this 2 or 3 times each day.  Gently clean the burned area.  Put ointment on it.  Cover the burn with a sterile bandage.  For some deeper burns or burns that cover a large area, compression garments may be prescribed. These garments can help minimize scarring and protect your mobility.  Do not put butter or oil on your skin. Use only the cream prescribed by your caregiver.  Do not put ice on your burn.  Do not break blisters on your skin.  Keep the bandaged area dry. You might need to take a sponge bath for awhile.Ask your caregiver when you can take a shower or a tub bath again.  Do not scratch an itchy  burn. Your caregiver may give you medicine to relieve very bad itching.  Infection is a big danger after a second-degree burn. Tell your caregiver right away if you have signs of infection, such as:  Redness or changing color in the burned area.  Fluid leaking from the burn.  Swelling in the burn area.  A bad smell coming from the wound. Follow-up  Keep all follow-up appointments.This is important. This is how your caregiver can tell if your treatment is working.  Protect your burn from sunlight.Use sunscreen whenever you go outside.Burned areas may be sensitive to the sun for up to 1 year. Exposure to the sun may also cause permanent darkening of scars. SEEK MEDICAL CARE IF:  You have any questions about medicines.  You have any questions about your treatment.  You wonder if it is okay to do a particular activity.  You develop a fever of more than 100.5 F (38.1 C). SEEK IMMEDIATE MEDICAL CARE IF:  You think your burn might be infected. It may change color, become red, leak fluid, swell, or smell bad.  You develop a fever of more than 102 F (38.9 C). Document Released:  You have any questions about medicines.   You have any questions about your treatment.   You wonder if it is okay to do a particular activity.   You develop a fever of more than 100.5 F (38.1 C).  SEEK IMMEDIATE MEDICAL CARE IF:   You think your burn might be infected. It may change color, become red, leak fluid, swell, or smell bad.   You develop a fever of more than 102 F (38.9 C).  Document Released: 03/12/2011 Document Revised: 12/31/2011 Document Reviewed: 03/12/2011  [employer] Patient Information 2014 [employer].

## 2014-01-15 ENCOUNTER — Emergency Department (HOSPITAL_COMMUNITY)
Admission: EM | Admit: 2014-01-15 | Discharge: 2014-01-15 | Disposition: A | Discharge status: Home / Self Care | Payer: 59 | Attending: Emergency Medicine | Admitting: Emergency Medicine

## 2014-01-15 ENCOUNTER — Encounter (HOSPITAL_COMMUNITY): Payer: Self-pay | Admitting: Emergency Medicine

## 2014-01-15 DIAGNOSIS — Z79899 Other long term (current) drug therapy: Secondary | ICD-10-CM | POA: Insufficient documentation

## 2014-01-15 DIAGNOSIS — Z791 Long term (current) use of non-steroidal anti-inflammatories (NSAID): Secondary | ICD-10-CM | POA: Insufficient documentation

## 2014-01-15 DIAGNOSIS — J029 Acute pharyngitis, unspecified: Secondary | ICD-10-CM | POA: Insufficient documentation

## 2014-01-15 DIAGNOSIS — R110 Nausea: Secondary | ICD-10-CM | POA: Insufficient documentation

## 2014-01-15 DIAGNOSIS — R51 Headache: Secondary | ICD-10-CM | POA: Insufficient documentation

## 2014-01-15 DIAGNOSIS — R519 Headache, unspecified: Secondary | ICD-10-CM

## 2014-01-15 MED ORDER — NAPROXEN 500 MG PO TABS: 500.0000 mg | ORAL_TABLET | Freq: Two times a day (BID) | ORAL | Status: DC

## 2014-01-15 MED ORDER — SALINE SPRAY 0.65 % NA SOLN: 1.0000 | NASAL | Status: DC | PRN

## 2014-01-15 MED ORDER — CETIRIZINE HCL 10 MG PO CAPS: 10.0000 mg | ORAL_CAPSULE | Freq: Every day | ORAL | Status: DC

## 2014-01-15 NOTE — Discharge Instructions (Signed)
You may find relief using over the counter nasal sinus flushes.  Ask any pharmacist to help direct you to these products that help rinse sinuses, keep them moist and clean out dirt and pollen.  You may taken naproxen twice daily as needed for headache or continue taking tylenol sinus.  Be sure to take cetrizine daily.  It works best as a preventative medication if taken every day. See below for further instruction.

## 2014-01-15 NOTE — ED Notes (Signed)
Per pt, states headache for six days with no relief from OTC meds-patient on phone during triage-no N/V

## 2014-01-15 NOTE — Progress Notes (Signed)
   CARE MANAGEMENT ED NOTE 01/15/2014  Patient:  Rachel Sanders,Rachel Sanders   Account Number:  [id number]  Date Initiated:  01/15/2014  Documentation initiated by:  Edd ArbourGIBBS,Shanard Treto  Subjective/Objective Assessment:   23 yr old [facility] pt no pcp listed on arrival c/o headache for six days with no relief from OTC meds-patient on phone during triage-no N/V     Subjective/Objective Assessment Detail:   pcp is Sharlot GowdaJohn Lalonde     Action/Plan:   Action/Plan Detail:   epic updated   Anticipated DC Date:  01/15/2014     Status Recommendation to Physician:   Result of Recommendation:    Other ED Services  Consult Working Plan    DC Planning Services  Other  Outpatient Services - Pt will follow up  PCP issues    Choice offered to / List presented to:            Status of service:  Completed, signed off  ED Comments:   ED Comments Detail:

## 2014-01-15 NOTE — ED Provider Notes (Signed)
CSN: [id number]     Arrival date & time 01/15/14  1021 History  This chart was scribed for non-physician practitioner Junius FinnerErin O'Malley, PA-C working with Raeford RazorStephen Kohut, MD by Dorothey Basemania Sutton, ED Scribe. This patient was seen in room WTR6/WTR6 and the patient's care was started at 12:20 PM.   Chief Complaint  Patient presents with  . Headache   The history is provided by the patient. No language interpreter was used.   HPI Comments: Rachel Sanders is a 23 y.o. female who presents to the Emergency Department complaining of a constant, throbbing headache, 10/10 currently, to the bilateral temples onset 6 days ago. Patient denies any exacerbating or alleviating factors. She reports some associated congestion, mild nausea, and sore throat. Patient reports taking Tylenol and Tylenol Sinus at home without significant relief. She denies fever, vomiting, visual disturbance. Patient reports that she has been exposed to sick contacts with a URI. She denies any allergies to medications. Patient has no other pertinent medical history.   Past Medical History  Diagnosis Date  . No pertinent past medical history    Past Surgical History  Procedure Laterality Date  . No past surgeries     No family history on file. History  Substance Use Topics  . Smoking status: Never Smoker   . Smokeless tobacco: Never Used  . Alcohol Use: No   OB History   Grav Para Term Preterm Abortions TAB SAB Ect Mult Living   1 1 1  0 0 0 0 0 0 1     Review of Systems  Constitutional: Negative for fever.  HENT: Positive for congestion and sore throat.   Eyes: Negative for visual disturbance.  Gastrointestinal: Positive for nausea. Negative for vomiting.  Neurological: Positive for headaches.  All other systems reviewed and are negative.    Allergies  Review of patient's allergies indicates no known allergies.  Home Medications   Current Outpatient Rx  Name  Route  Sig  Dispense  Refill  . acetaminophen (TYLENOL) 500 MG  tablet   Oral   Take 1,000 mg by mouth every 6 (six) hours as needed for fever or headache.         . pseudoephedrine-acetaminophen (TYLENOL SINUS) 30-500 MG TABS   Oral   Take 2 tablets by mouth every 12 (twelve) hours as needed (pain relief).         . Cetirizine HCl 10 MG CAPS   Oral   Take 1 capsule (10 mg total) by mouth daily.   30 capsule   0   . naproxen (NAPROSYN) 500 MG tablet   Oral   Take 1 tablet (500 mg total) by mouth 2 (two) times daily.   30 tablet   0   . sodium chloride (OCEAN) 0.65 % SOLN nasal spray   Each Nare   Place 1 spray into both nostrils as needed for congestion.   1 Bottle   0    Triage Vitals: BP 126/78  Pulse 98  Temp(Src) 97.9 F (36.6 C) (Oral)  Resp 18  SpO2 100%  LMP 01/06/2014  Physical Exam  Nursing note and vitals reviewed. Constitutional: She is oriented to person, place, and time. She appears well-developed and well-nourished.  Pt sitting in exam chair texting on her phone earphone in place in right ear.   HENT:  Head: Normocephalic and atraumatic.  Right Ear: Hearing, tympanic membrane, external ear and ear canal normal.  Left Ear: Hearing, tympanic membrane, external ear and ear canal normal.  Nose:  Mucosal edema present.  Mouth/Throat: Oropharynx is clear and moist.  Eyes: EOM are normal. Pupils are equal, round, and reactive to light.  Neck: Normal range of motion. Neck supple.  No nuchal rigidity or meningeal signs.   Cardiovascular: Normal rate, regular rhythm and normal heart sounds.   Pulmonary/Chest: Effort normal and breath sounds normal. No respiratory distress. She has no wheezes. She has no rales. She exhibits no tenderness.  Abdominal: Soft. She exhibits no distension. There is no tenderness.  Musculoskeletal: Normal range of motion.  Neurological: She is alert and oriented to person, place, and time.  Skin: Skin is warm and dry.  Psychiatric: She has a normal mood and affect. Her behavior is normal.     ED Course  Procedures (including critical care time)  DIAGNOSTIC STUDIES: Oxygen Saturation is 100% on room air, normal by my interpretation.    COORDINATION OF CARE: 12:23 PM- Discussed that symptoms appear to be due to sinus inflammation. Will discharge patient with medications to manage symptoms. Advised patient to use a netty pot at home. Discussed treatment plan with patient at bedside and patient verbalized agreement.     Labs Review Labs Reviewed - No data to display Imaging Review No results found.   EKG Interpretation None      MDM   Final diagnoses:  Sinus headache    Headache appears to be due to sinus inflammation. Congestion has been improving per pt, do not believe pt needs antibiotics for sinusitis at this time.  Not concerned for emergent process taking place. Will tx symptomatically. Rx: ocean nasal spray, naproxen, and cetirizine. Advised to continue taking tylenol cold and sinus as needed for pain. Return precautions provided. Pt verbalized understanding and agreement with tx plan.   I personally performed the services described in this documentation, which was scribed in my presence. The recorded information has been reviewed and is accurate.     Junius Finner, PA-C 01/16/14 1216

## 2014-01-17 NOTE — ED Provider Notes (Signed)
Medical screening examination/treatment/procedure(s) were performed by non-physician practitioner and as supervising physician I was immediately available for consultation/collaboration.   EKG Interpretation None       Hannahgrace Lalli, MD 01/17/14 0659 

## 2014-03-09 LAB — OB RESULTS CONSOLE ABO/RH: RH TYPE: NEGATIVE

## 2014-03-09 LAB — OB RESULTS CONSOLE RPR: RPR: NONREACTIVE

## 2014-03-09 LAB — OB RESULTS CONSOLE GBS: STREP GROUP B AG: POSITIVE

## 2014-03-09 LAB — OB RESULTS CONSOLE HIV ANTIBODY (ROUTINE TESTING): HIV: NONREACTIVE

## 2014-03-09 LAB — OB RESULTS CONSOLE ANTIBODY SCREEN: Antibody Screen: NEGATIVE

## 2014-03-09 LAB — OB RESULTS CONSOLE HEPATITIS B SURFACE ANTIGEN: Hepatitis B Surface Ag: NEGATIVE

## 2014-03-09 LAB — OB RESULTS CONSOLE GC/CHLAMYDIA
Chlamydia: NEGATIVE
GC PROBE AMP, GENITAL: NEGATIVE

## 2014-03-09 LAB — OB RESULTS CONSOLE RUBELLA ANTIBODY, IGM: Rubella: IMMUNE

## 2014-08-12 LAB — US OB COMP + 14 WK

## 2014-08-23 ENCOUNTER — Encounter (HOSPITAL_COMMUNITY): Payer: Self-pay | Admitting: Emergency Medicine

## 2014-08-26 ENCOUNTER — Encounter (HOSPITAL_COMMUNITY): Payer: Self-pay | Admitting: Emergency Medicine

## 2014-08-26 ENCOUNTER — Emergency Department (HOSPITAL_COMMUNITY): Payer: Medicaid Other

## 2014-08-26 ENCOUNTER — Observation Stay (HOSPITAL_COMMUNITY): Payer: Medicaid Other

## 2014-08-26 ENCOUNTER — Observation Stay (HOSPITAL_COMMUNITY)
Admission: EM | Admit: 2014-08-26 | Discharge: 2014-08-28 | Disposition: A | Discharge status: Other Acute Inpatient Hospital | Payer: Medicaid Other | Attending: Obstetrics and Gynecology | Admitting: Obstetrics and Gynecology

## 2014-08-26 DIAGNOSIS — Y999 Unspecified external cause status: Secondary | ICD-10-CM | POA: Diagnosis not present

## 2014-08-26 DIAGNOSIS — O26899 Other specified pregnancy related conditions, unspecified trimester: Secondary | ICD-10-CM

## 2014-08-26 DIAGNOSIS — R8271 Bacteriuria: Secondary | ICD-10-CM | POA: Diagnosis present

## 2014-08-26 DIAGNOSIS — O09299 Supervision of pregnancy with other poor reproductive or obstetric history, unspecified trimester: Secondary | ICD-10-CM

## 2014-08-26 DIAGNOSIS — M549 Dorsalgia, unspecified: Secondary | ICD-10-CM | POA: Insufficient documentation

## 2014-08-26 DIAGNOSIS — R079 Chest pain, unspecified: Secondary | ICD-10-CM | POA: Diagnosis present

## 2014-08-26 DIAGNOSIS — Z6791 Unspecified blood type, Rh negative: Secondary | ICD-10-CM | POA: Diagnosis present

## 2014-08-26 DIAGNOSIS — Y9389 Activity, other specified: Secondary | ICD-10-CM | POA: Insufficient documentation

## 2014-08-26 DIAGNOSIS — O9989 Other specified diseases and conditions complicating pregnancy, childbirth and the puerperium: Secondary | ICD-10-CM | POA: Diagnosis not present

## 2014-08-26 DIAGNOSIS — Y92481 Parking lot as the place of occurrence of the external cause: Secondary | ICD-10-CM | POA: Diagnosis not present

## 2014-08-26 DIAGNOSIS — R000 Tachycardia, unspecified: Secondary | ICD-10-CM

## 2014-08-26 LAB — URINALYSIS, ROUTINE W REFLEX MICROSCOPIC
BILIRUBIN URINE: NEGATIVE
Glucose, UA: NEGATIVE mg/dL
HGB URINE DIPSTICK: NEGATIVE
Ketones, ur: 40 mg/dL — AB
Leukocytes, UA: NEGATIVE
Nitrite: NEGATIVE
PH: 7 (ref 5.0–8.0)
Protein, ur: NEGATIVE mg/dL
SPECIFIC GRAVITY, URINE: 1.011 (ref 1.005–1.030)
Urobilinogen, UA: 0.2 mg/dL (ref 0.0–1.0)

## 2014-08-26 LAB — COMPREHENSIVE METABOLIC PANEL
ALBUMIN: 3 g/dL — AB (ref 3.5–5.2)
ALT: 10 U/L (ref 0–35)
ANION GAP: 15 (ref 5–15)
AST: 14 U/L (ref 0–37)
Alkaline Phosphatase: 80 U/L (ref 39–117)
BUN: 5 mg/dL — AB (ref 6–23)
CALCIUM: 8.9 mg/dL (ref 8.4–10.5)
CHLORIDE: 105 meq/L (ref 96–112)
CO2: 18 mEq/L — ABNORMAL LOW (ref 19–32)
Creatinine, Ser: 0.41 mg/dL — ABNORMAL LOW (ref 0.50–1.10)
GFR calc Af Amer: >90 mL/min (ref >90)
GFR calc non Af Amer: >90 mL/min (ref >90)
Glucose, Bld: 89 mg/dL (ref 70–99)
Potassium: 3.9 mEq/L (ref 3.7–5.3)
Sodium: 138 mEq/L (ref 137–147)
TOTAL PROTEIN: 6.6 g/dL (ref 6.0–8.3)
Total Bilirubin: 0.3 mg/dL (ref 0.3–1.2)

## 2014-08-26 LAB — US OB LIMITED

## 2014-08-26 LAB — CBC
HEMATOCRIT: 28.6 % — AB (ref 36.0–46.0)
HEMOGLOBIN: 9.8 g/dL — AB (ref 12.0–15.0)
MCH: 30.7 pg (ref 26.0–34.0)
MCHC: 34.3 g/dL (ref 30.0–36.0)
MCV: 89.7 fL (ref 78.0–100.0)
Platelets: 312 10*3/uL (ref 150–400)
RBC: 3.19 MIL/uL — AB (ref 3.87–5.11)
RDW: 12.8 % (ref 11.5–15.5)
WBC: 11.4 10*3/uL — ABNORMAL HIGH (ref 4.0–10.5)

## 2014-08-26 MED ORDER — LACTATED RINGERS IV SOLN
INTRAVENOUS | Status: DC
  Administered 2014-08-27 – 2014-08-28 (×3): via INTRAVENOUS

## 2014-08-26 MED ORDER — SODIUM CHLORIDE 0.9 % IV BOLUS (SEPSIS)
1000.0000 mL | Freq: Once | INTRAVENOUS | Status: AC
  Administered 2014-08-26: 1000 mL via INTRAVENOUS

## 2014-08-26 MED ORDER — CALCIUM CARBONATE ANTACID 500 MG PO CHEW: 2.0000 | CHEWABLE_TABLET | ORAL | Status: DC | PRN

## 2014-08-26 MED ORDER — RHO D IMMUNE GLOBULIN 1500 UNIT/2ML IJ SOSY
300.0000 ug | PREFILLED_SYRINGE | Freq: Once | INTRAMUSCULAR | Status: AC
  Administered 2014-08-26: 300 ug via INTRAVENOUS
  Filled 2014-08-26: qty 2

## 2014-08-26 MED ORDER — ZOLPIDEM TARTRATE 5 MG PO TABS: 5.0000 mg | ORAL_TABLET | Freq: Every evening | ORAL | Status: DC | PRN

## 2014-08-26 MED ORDER — PRENATAL MULTIVITAMIN CH
1.0000 | ORAL_TABLET | Freq: Every day | ORAL | Status: DC
  Administered 2014-08-27 – 2014-08-28 (×2): 1 via ORAL
  Filled 2014-08-26 (×2): qty 1

## 2014-08-26 MED ORDER — ACETAMINOPHEN 325 MG PO TABS
650.0000 mg | ORAL_TABLET | ORAL | Status: DC | PRN
  Administered 2014-08-28: 650 mg via ORAL
  Filled 2014-08-26: qty 2

## 2014-08-26 MED ORDER — ACETAMINOPHEN 325 MG PO TABS
650.0000 mg | ORAL_TABLET | Freq: Once | ORAL | Status: AC
  Administered 2014-08-26: 650 mg via ORAL
  Filled 2014-08-26: qty 2

## 2014-08-26 MED ORDER — LACTATED RINGERS IV BOLUS (SEPSIS)
500.0000 mL | Freq: Once | INTRAVENOUS | Status: AC
  Administered 2014-08-26: 1000 mL via INTRAVENOUS

## 2014-08-26 MED ORDER — CYCLOBENZAPRINE HCL 10 MG PO TABS
10.0000 mg | ORAL_TABLET | Freq: Three times a day (TID) | ORAL | Status: DC
  Administered 2014-08-26 – 2014-08-28 (×3): 10 mg via ORAL
  Filled 2014-08-26 (×4): qty 1

## 2014-08-26 MED ORDER — DOCUSATE SODIUM 100 MG PO CAPS
100.0000 mg | ORAL_CAPSULE | Freq: Every day | ORAL | Status: DC
  Administered 2014-08-28: 100 mg via ORAL
  Filled 2014-08-26 (×2): qty 1

## 2014-08-26 MED ORDER — BUTORPHANOL TARTRATE 1 MG/ML IJ SOLN
1.0000 mg | INTRAMUSCULAR | Status: DC | PRN
  Administered 2014-08-26: 1 mg via INTRAVENOUS
  Filled 2014-08-26: qty 1

## 2014-08-26 NOTE — ED Notes (Signed)
Asked for urine pt states already gone to bathroom did not collect a sample.
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
## 2014-08-26 NOTE — ED Notes (Signed)
Pt. placed on fetal monitor.

## 2014-08-26 NOTE — ED Provider Notes (Signed)
Chest x-ray viewed by me. Doubt serious thoracic injury. Patient has minimal chest wall tenderness no seatbelt sign.  Rachel SouSam Jolee Critcher, MD 08/26/14 1524
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
## 2014-08-26 NOTE — ED Notes (Signed)
Pt ambulating to bathroom with RR OB.
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
## 2014-08-26 NOTE — ED Provider Notes (Signed)
Case discussed with Dr. Lance MorinA. Roberts. [name] fetal [facility]. At 3:30 PM she sitting up in bed, appears comfortable. Eating Glasgow Coma Score 15. No distress  Doug SouSam Neytiri Asche, MD 08/26/14 [phone]

## 2014-08-26 NOTE — ED Notes (Signed)
Rapid response  OB nurse called
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
## 2014-08-27 DIAGNOSIS — O26899 Other specified pregnancy related conditions, unspecified trimester: Secondary | ICD-10-CM

## 2014-08-27 DIAGNOSIS — Z6791 Unspecified blood type, Rh negative: Secondary | ICD-10-CM | POA: Diagnosis present

## 2014-08-27 DIAGNOSIS — O09299 Supervision of pregnancy with other poor reproductive or obstetric history, unspecified trimester: Secondary | ICD-10-CM

## 2014-08-27 DIAGNOSIS — R8271 Bacteriuria: Secondary | ICD-10-CM | POA: Diagnosis present

## 2014-08-27 LAB — COMPREHENSIVE METABOLIC PANEL
ALK PHOS: 68 U/L (ref 39–117)
ALT: 7 U/L (ref 0–35)
AST: 13 U/L (ref 0–37)
Albumin: 2.4 g/dL — ABNORMAL LOW (ref 3.5–5.2)
Anion gap: 10 (ref 5–15)
BUN: 5 mg/dL — ABNORMAL LOW (ref 6–23)
CO2: 20 meq/L (ref 19–32)
CREATININE: 0.44 mg/dL — AB (ref 0.50–1.10)
Calcium: 8.1 mg/dL — ABNORMAL LOW (ref 8.4–10.5)
Chloride: 108 mEq/L (ref 96–112)
GFR calc Af Amer: >90 mL/min (ref >90)
Glucose, Bld: 78 mg/dL (ref 70–99)
POTASSIUM: 3.7 meq/L (ref 3.7–5.3)
Sodium: 138 mEq/L (ref 137–147)
Total Bilirubin: 0.3 mg/dL (ref 0.3–1.2)
Total Protein: 5.4 g/dL — ABNORMAL LOW (ref 6.0–8.3)

## 2014-08-27 LAB — PROTEIN / CREATININE RATIO, URINE
Creatinine, Urine: 62.28 mg/dL
Protein Creatinine Ratio: 0.09 (ref 0.00–0.15)
Total Protein, Urine: 5.7 mg/dL

## 2014-08-27 LAB — IRON AND TIBC
IRON: 149 ug/dL — AB (ref 42–135)
Saturation Ratios: 35 % (ref 20–55)
TIBC: 422 ug/dL (ref 250–470)
UIBC: 273 ug/dL (ref 125–400)

## 2014-08-27 LAB — RH IG WORKUP (INCLUDES ABO/RH)
ABO/RH(D): A NEG
Antibody Screen: NEGATIVE
Fetal Screen: NEGATIVE
GESTATIONAL AGE(WKS): 29
Unit division: 0

## 2014-08-27 LAB — RETICULOCYTES
RBC.: 2.66 MIL/uL — ABNORMAL LOW (ref 3.87–5.11)
RETIC CT PCT: 3 % (ref 0.4–3.1)
Retic Count, Absolute: 79.8 10*3/uL (ref 19.0–186.0)

## 2014-08-27 LAB — CBC WITH DIFFERENTIAL/PLATELET
Basophils Absolute: 0 10*3/uL (ref 0.0–0.1)
Basophils Relative: 0 % (ref 0–1)
Eosinophils Absolute: 0.1 10*3/uL (ref 0.0–0.7)
Eosinophils Relative: 1 % (ref 0–5)
HEMATOCRIT: 24.7 % — AB (ref 36.0–46.0)
HEMOGLOBIN: 8.4 g/dL — AB (ref 12.0–15.0)
LYMPHS PCT: 21 % (ref 12–46)
Lymphs Abs: 2.1 10*3/uL (ref 0.7–4.0)
MCH: 31.3 pg (ref 26.0–34.0)
MCHC: 34 g/dL (ref 30.0–36.0)
MCV: 92.2 fL (ref 78.0–100.0)
MONO ABS: 0.7 10*3/uL (ref 0.1–1.0)
MONOS PCT: 7 % (ref 3–12)
NEUTROS ABS: 7.2 10*3/uL (ref 1.7–7.7)
NEUTROS PCT: 71 % (ref 43–77)
Platelets: 254 10*3/uL (ref 150–400)
RBC: 2.68 MIL/uL — ABNORMAL LOW (ref 3.87–5.11)
RDW: 13.1 % (ref 11.5–15.5)
WBC: 10.1 10*3/uL (ref 4.0–10.5)

## 2014-08-27 LAB — PREPARE RBC (CROSSMATCH)

## 2014-08-27 LAB — URIC ACID: Uric Acid, Serum: 3 mg/dL (ref 2.4–7.0)

## 2014-08-27 LAB — LACTATE DEHYDROGENASE: LDH: 124 U/L (ref 94–250)

## 2014-08-27 LAB — FERRITIN: FERRITIN: 17 ng/mL (ref 10–291)

## 2014-08-27 LAB — SAVE SMEAR

## 2014-08-27 MED ORDER — OXYCODONE-ACETAMINOPHEN 5-325 MG PO TABS
2.0000 | ORAL_TABLET | ORAL | Status: DC | PRN
  Administered 2014-08-27 – 2014-08-28 (×2): 2 via ORAL
  Filled 2014-08-27 (×3): qty 2

## 2014-08-27 MED ORDER — SODIUM CHLORIDE 0.9 % IV SOLN
INTRAVENOUS | Status: DC
  Administered 2014-08-27 (×3): via INTRAVENOUS

## 2014-08-27 MED ORDER — DIPHENHYDRAMINE HCL 25 MG PO CAPS
25.0000 mg | ORAL_CAPSULE | Freq: Once | ORAL | Status: AC
  Administered 2014-08-27: 25 mg via ORAL
  Filled 2014-08-27: qty 1

## 2014-08-27 MED ORDER — SODIUM CHLORIDE 0.9 % IV SOLN
Freq: Once | INTRAVENOUS | Status: AC
  Administered 2014-08-27: 18:00:00 via INTRAVENOUS

## 2014-08-27 NOTE — Progress Notes (Signed)
UR chart review completed.  
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
## 2014-08-27 NOTE — Progress Notes (Signed)
Blood administration rate increased to 300cc/hr to infuse the rest of blood volume within ordered parameter of 2 hours. Volume listed on blood bag label does not match actual volume within the bag. On coming shift aware that volume will need to be adjusted on paperwork and in the computer to match actual volume infused according to the pump.

## 2014-08-27 NOTE — Progress Notes (Signed)
Blood bank states units should be ready in two hours. Venus Standard CNM on unit and aware.

## 2014-08-27 NOTE — Progress Notes (Signed)
Addendum Note  Results for orders placed or performed during the hospital encounter of 08/26/14 (from the past 24 hour(s))  Comprehensive metabolic panel     Status: Abnormal   Collection Time: 08/26/14  1:52 PM  Result Value Ref Range   Sodium 138 137 - 147 mEq/L   Potassium 3.9 3.7 - 5.3 mEq/L   Chloride 105 96 - 112 mEq/L   CO2 18 (L) 19 - 32 mEq/L   Glucose, Bld 89 70 - 99 mg/dL   BUN 5 (L) 6 - 23 mg/dL   Creatinine, Ser 8.290.41 (L) 0.50 - 1.10 mg/dL   Calcium 8.9 8.4 - 56.210.5 mg/dL   Total Protein 6.6 6.0 - 8.3 g/dL   Albumin 3.0 (L) 3.5 - 5.2 g/dL   AST 14 0 - 37 U/L   ALT 10 0 - 35 U/L   Alkaline Phosphatase 80 39 - 117 U/L   Total Bilirubin 0.3 0.3 - 1.2 mg/dL   GFR calc non Af Amer >90 >90 mL/min   GFR calc Af Amer >90 >90 mL/min   Anion gap 15 5 - 15  CBC     Status: Abnormal   Collection Time: 08/26/14  1:52 PM  Result Value Ref Range   WBC 11.4 (H) 4.0 - 10.5 K/uL   RBC 3.19 (L) 3.87 - 5.11 MIL/uL   Hemoglobin 9.8 (L) 12.0 - 15.0 g/dL   HCT 13.028.6 (L) 86.536.0 - 78.446.0 %   MCV 89.7 78.0 - 100.0 fL   MCH 30.7 26.0 - 34.0 pg   MCHC 34.3 30.0 - 36.0 g/dL   RDW 69.612.8 29.511.5 - 28.415.5 %   Platelets 312 150 - 400 K/uL  Urinalysis, Routine w reflex microscopic     Status: Abnormal   Collection Time: 08/26/14  3:40 PM  Result Value Ref Range   Color, Urine YELLOW YELLOW   APPearance CLOUDY (A) CLEAR   Specific Gravity, Urine 1.011 1.005 - 1.030   pH 7.0 5.0 - 8.0   Glucose, UA NEGATIVE NEGATIVE mg/dL   Hgb urine dipstick NEGATIVE NEGATIVE   Bilirubin Urine NEGATIVE NEGATIVE   Ketones, ur 40 (A) NEGATIVE mg/dL   Protein, ur NEGATIVE NEGATIVE mg/dL   Urobilinogen, UA 0.2 0.0 - 1.0 mg/dL   Nitrite NEGATIVE NEGATIVE   Leukocytes, UA NEGATIVE NEGATIVE  Rh IG workup (includes ABO/Rh)     Status: None   Collection Time: 08/26/14  7:28 PM  Result Value Ref Range   Gestational Age(Wks) 29    ABO/RH(D) A NEG    Antibody Screen NEG    Fetal Screen NEG    Unit Number [id number]     Blood Component Type RHIG    Unit division 00    Status of Unit ISSUED,FINAL    Transfusion Status OK TO TRANSFUSE   CBC with Differential     Status: Abnormal   Collection Time: 08/27/14  8:36 AM  Result Value Ref Range   WBC 10.1 4.0 - 10.5 K/uL   RBC 2.68 (L) 3.87 - 5.11 MIL/uL   Hemoglobin 8.4 (L) 12.0 - 15.0 g/dL   HCT 36.624.7 (L) 44.036.0 - 34.746.0 %   MCV 92.2 78.0 - 100.0 fL   MCH 31.3 26.0 - 34.0 pg   MCHC 34.0 30.0 - 36.0 g/dL   RDW 42.513.1 95.611.5 - 38.715.5 %   Platelets 254 150 - 400 K/uL   Neutrophils Relative % 71 43 - 77 %   Neutro Abs 7.2 1.7 - 7.7 K/uL   Lymphocytes  Relative 21 12 - 46 %   Lymphs Abs 2.1 0.7 - 4.0 K/uL   Monocytes Relative 7 3 - 12 %   Monocytes Absolute 0.7 0.1 - 1.0 K/uL   Eosinophils Relative 1 0 - 5 %   Eosinophils Absolute 0.1 0.0 - 0.7 K/uL   Basophils Relative 0 0 - 1 %   Basophils Absolute 0.0 0.0 - 0.1 K/uL  Comprehensive metabolic panel     Status: Abnormal   Collection Time: 08/27/14  8:36 AM  Result Value Ref Range   Sodium 138 137 - 147 mEq/L   Potassium 3.7 3.7 - 5.3 mEq/L   Chloride 108 96 - 112 mEq/L   CO2 20 19 - 32 mEq/L   Glucose, Bld 78 70 - 99 mg/dL   BUN 5 (L) 6 - 23 mg/dL   Creatinine, Ser 1.610.44 (L) 0.50 - 1.10 mg/dL   Calcium 8.1 (L) 8.4 - 10.5 mg/dL   Total Protein 5.4 (L) 6.0 - 8.3 g/dL   Albumin 2.4 (L) 3.5 - 5.2 g/dL   AST 13 0 - 37 U/L   ALT 7 0 - 35 U/L   Alkaline Phosphatase 68 39 - 117 U/L   Total Bilirubin 0.3 0.3 - 1.2 mg/dL   GFR calc non Af Amer >90 >90 mL/min   GFR calc Af Amer >90 >90 mL/min   Anion gap 10 5 - 15  Lactate dehydrogenase     Status: None   Collection Time: 08/27/14  8:36 AM  Result Value Ref Range   LDH 124 94 - 250 U/L  Uric acid     Status: None   Collection Time: 08/27/14  8:36 AM  Result Value Ref Range   Uric Acid, Serum 3.0 2.4 - 7.0 mg/dL  Save smear     Status: None   Collection Time: 08/27/14  8:36 AM  Result Value Ref Range   Smear Review SMEAR STAINED AND AVAILABLE FOR REVIEW    Reticulocytes     Status: Abnormal   Collection Time: 08/27/14  8:36 AM  Result Value Ref Range   Retic Ct Pct 3.0 0.4 - 3.1 %   RBC. 2.66 (L) 3.87 - 5.11 MIL/uL   Retic Count, Manual 79.8 19.0 - 186.0 K/uL   Transfuse 2 units of PRBC Percocet now for HA Benadryl prior to transfusing CBC redraw 4 hours after completion Dr Sallye OberKulwa aware   Consulted with Dr. Cheryln ManlyC to home  FU in the office in   [facility] Sanders, CNM, MSN 08/27/2014. 11:57 AM

## 2014-08-28 ENCOUNTER — Observation Stay (HOSPITAL_COMMUNITY): Payer: Medicaid Other

## 2014-08-28 DIAGNOSIS — I059 Rheumatic mitral valve disease, unspecified: Secondary | ICD-10-CM

## 2014-08-28 LAB — T4, FREE: Free T4: 0.9 ng/dL (ref 0.80–1.80)

## 2014-08-28 LAB — TYPE AND SCREEN
ABO/RH(D): A NEG
ANTIBODY SCREEN: POSITIVE
DAT, IGG: NEGATIVE
UNIT DIVISION: 0
UNIT DIVISION: 0

## 2014-08-28 LAB — CBC
HEMATOCRIT: 29.9 % — AB (ref 36.0–46.0)
HEMOGLOBIN: 10.2 g/dL — AB (ref 12.0–15.0)
MCH: 31.1 pg (ref 26.0–34.0)
MCHC: 34.1 g/dL (ref 30.0–36.0)
MCV: 91.2 fL (ref 78.0–100.0)
Platelets: 248 10*3/uL (ref 150–400)
RBC: 3.28 MIL/uL — AB (ref 3.87–5.11)
RDW: 13.5 % (ref 11.5–15.5)
WBC: 10.8 10*3/uL — AB (ref 4.0–10.5)

## 2014-08-28 LAB — T3, FREE: T3 FREE: 2.9 pg/mL (ref 2.3–4.2)

## 2014-08-28 LAB — TSH: TSH: 1.94 u[IU]/mL (ref 0.350–4.500)

## 2014-08-28 MED ORDER — PRENATAL MULTIVITAMIN CH: 1.0000 | ORAL_TABLET | Freq: Every day | ORAL | Status: DC

## 2014-08-28 MED ORDER — IOHEXOL 350 MG/ML SOLN
100.0000 mL | Freq: Once | INTRAVENOUS | Status: AC | PRN
  Administered 2014-08-28: 100 mL via INTRAVENOUS

## 2014-08-28 MED ORDER — FERROUS SULFATE 325 (65 FE) MG PO TABS
325.0000 mg | ORAL_TABLET | Freq: Two times a day (BID) | ORAL | Status: DC
  Administered 2014-08-28 (×2): 325 mg via ORAL
  Filled 2014-08-28 (×2): qty 1

## 2014-08-28 NOTE — Progress Notes (Addendum)
CT scan findings: Small bilateral pleural effusions. Mild bilateral lower lobe atelectasis. There is also a mild patchy airspace opacity in the posterior aspect of the right lower lobe. No lung nodules or enlarged lymph nodes. Normally opacified pulmonary arteries with no pulmonary arterial filling defects seen. Minimal diffuse low density of the liver relative to the spleen. Small sliding hiatal hernia. Unremarkable bones. No fracture or pneumothorax.  Review of the MIP images confirms the above findings.  IMPRESSION: 1. No pulmonary emboli seen. 2. Small bilateral pleural effusions. 3. Mild bilateral lower lobe atelectasis. 4. Additional small amount of patchy atelectasis or pneumonia in the right lower lobe. 5. Minimal diffuse hepatic steatosis. 6. Small sliding hiatal hernia.  Echo findings; - Left ventricle: The cavity size was normal. Wall thickness was at the upper limits of normal. Systolic function was normal. The estimated ejection fraction was in the range of 50% to 55%. Wall motion was normal; there were no regional wall motion abnormalities. Left ventricular diastolic function parameters were normal. - Mitral valve: There was mild regurgitation. - Left atrium: The atrium was at the upper limits of normal in size. - Right atrium: Central venous pressure (est): 8 mm Hg. - Tricuspid valve: There was mild regurgitation. - Pulmonary arteries: Systolic pressure could not be accurately estimated. - Pericardium, extracardiac: A trivial pericardial effusion was identified posterior to the heart.  Impressions: - Upper normal LV wall thickness with LVEF 50-55%, normal diastolic function. Mild mitral and tricuspid regurgitation. Unable to assess PASP. Trivial posterior pericardial effusion.  Continue with Discharge

## 2014-08-28 NOTE — Discharge Instructions (Signed)
Preterm Labor Information Preterm labor is when labor starts at less than 37 weeks of pregnancy. The normal length of a pregnancy is 39 to 41 weeks. CAUSES Often, there is no identifiable underlying cause as to why a woman goes into preterm labor. One of the most common known causes of preterm labor is infection. Infections of the uterus, cervix, vagina, amniotic sac, bladder, kidney, or even the lungs (pneumonia) can cause labor to start. Other suspected causes of preterm labor include:   Urogenital infections, such as yeast infections and bacterial vaginosis.   Uterine abnormalities (uterine shape, uterine septum, fibroids, or bleeding from the placenta).   A cervix that has been operated on (it may fail to stay closed).   Malformations in the fetus.   Multiple gestations (twins, triplets, and so on).   Breakage of the amniotic sac.  RISK FACTORS  Having a previous history of preterm labor.   Having premature rupture of membranes (PROM).   Having a placenta that covers the opening of the cervix (placenta previa).   Having a placenta that separates from the uterus (placental abruption).   Having a cervix that is too weak to hold the fetus in the uterus (incompetent cervix).   Having too much fluid in the amniotic sac (polyhydramnios).   Taking illegal drugs or smoking while pregnant.   Not gaining enough weight while pregnant.   Being younger than 56 and older than 21 years old.   Having a low socioeconomic status.   Being African American. SYMPTOMS Signs and symptoms of preterm labor include:   Menstrual-like cramps, abdominal pain, or back pain.  Uterine contractions that are regular, as frequent as six in an hour, regardless of their intensity (may be mild or painful).  Contractions that start on the top of the uterus and spread down to the lower abdomen and back.   A sense of increased pelvic pressure.   A watery or bloody mucus discharge that  comes from the vagina.  TREATMENT Depending on the length of the pregnancy and other circumstances, your health care provider may suggest bed rest. If necessary, there are medicines that can be given to stop contractions and to mature the fetal lungs. If labor happens before 34 weeks of pregnancy, a prolonged hospital stay may be recommended. Treatment depends on the condition of both you and the fetus.  WHAT SHOULD YOU DO IF YOU THINK YOU ARE IN PRETERM LABOR? Call your health care provider right away. You will need to go to the hospital to get checked immediately. HOW CAN YOU PREVENT PRETERM LABOR IN FUTURE PREGNANCIES? You should:   Stop smoking if you smoke.  Maintain healthy weight gain and avoid chemicals and drugs that are not necessary.  Be watchful for any type of infection.  Inform your health care provider if you have a known history of preterm labor. Document Released: 12/29/2003 Document Revised: 06/10/2013 Document Reviewed: 11/10/2012 [facility] Patient Information 2015 [place], [place]. This information is not intended to replace advice given to you by your health care provider. Make sure you discuss any questions you have with your health care provider. Premature Rupture and Preterm Premature Rupture of Membranes Premature rupture of membranes (PROM) is when the membranes (amniotic sac) break open before contractions or labor starts. Rupture of membranes is commonly referred to as your water breaking. If PROM occurs before 37 weeks of pregnancy, it is called preterm premature rupture of membranes (PPROM). The amniotic sac holds the fetus, keeps infection out, and performs  other important functions. Having the amniotic sac rupture before 37 weeks of pregnancy can lead to serious problems and requires immediate attention by your health care provider. CAUSES  PROM near the end of the pregnancy may be caused by natural weakening of the membranes. PPROM is often due to an infection.  Other factors that may be associated with PROM include:  Stretching of the amniotic sac because of carrying multiples or having too much amniotic fluid.  Trauma.  Smoking during pregnancy.  Poor nutrition.  Previous preterm birth.  Vaginal bleeding.  Little to no prenatal care.  Problems with the placenta, such as placenta previa or placental abruption. RISKS OF PROM AND PPROM  Delivering a premature baby.  Getting a serious infection of the placental tissues (chorioamnionitis).  Early detachment of the placenta from the uterus (placental abruption).  Compression of the umbilical cord.  Needing a cesarean birth.  Developing a serious infection after delivery. SIGNS OF PROM OR PPROM   A sudden gush or slow leaking of fluid from the vagina.  Constant wet underwear. Sometimes, women mistake the leaking or wetness for urine, especially if the leak is slow and not a gush of fluid. If there is constant leaking or your underwear continues to get wet, your membranes have likely ruptured. WHAT TO DO IF YOU THINK YOUR MEMBRANES HAVE RUPTURED Call your health care provider right away. You will need to go to the hospital to get checked immediately. WHAT HAPPENS IF YOU ARE DIAGNOSED WITH PROM OR PPROM? Once you arrive at the hospital, you will have tests done. A cervical exam will be performed to check if the cervix has softened or started to open (dilate). If you are diagnosed with PROM, you may be induced within 24 hours if you are not having contractions. If you are diagnosed with PPROM and are not having contractions, you may be induced depending on your trimester.  If you have PPROM, you:  And your baby will be monitored closely for signs of infection or other complications.  May be given an antibiotic medicine to lower the chances of an infection developing.  May be given a steroid medicine to help mature the baby's lungs faster.  May be given a medicine to stop preterm  labor.  May be ordered to be on bed rest at home or in the hospital.  May be induced if complications arise for you or the baby. Your treatment will depend on many factors, such as how far along you are, the development of the baby, and other complications that may arise. Document Released: 10/08/2005 Document Revised: 07/29/2013 Document Reviewed: 01/27/2013 [facility] Patient Information 2015 [place]. This information is not intended to replace advice given to you by your health care provider. Make sure you discuss any questions you have with your health care provider. Fetal Movement Counts Patient Name: __________________________________________________ Patient Due Date: ____________________ Performing a fetal movement count is highly recommended in high-risk pregnancies, but it is good for every pregnant woman to do. Your health care provider may ask you to start counting fetal movements at 28 weeks of the pregnancy. Fetal movements often increase:  After eating a full meal.  After physical activity.  After eating or drinking something sweet or cold.  At rest. Pay attention to when you feel the baby is most active. This will help you notice a pattern of your baby's sleep and wake cycles and what factors contribute to an increase in fetal movement. It is important to perform a  fetal movement count at the same time each day when your baby is normally most active.  HOW TO COUNT FETAL MOVEMENTS  Find a quiet and comfortable area to sit or lie down on your left side. Lying on your left side provides the best blood and oxygen circulation to your baby.  Write down the day and time on a sheet of paper or in a journal.  Start counting kicks, flutters, swishes, rolls, or jabs in a 2-hour period. You should feel at least 10 movements within 2 hours.  If you do not feel 10 movements in 2 hours, wait 2-3 hours and count again. Look for a change in the pattern or not enough counts in 2  hours. SEEK MEDICAL CARE IF:  You feel less than 10 counts in 2 hours, tried twice.  There is no movement in over an hour.  The pattern is changing or taking longer each day to reach 10 counts in 2 hours.  You feel the baby is not moving as he or she usually does. Date: ____________ Movements: ____________ Start time: ____________ Doreatha Martin time: ____________  Date: ____________ Movements: ____________ Start time: ____________ Doreatha Martin time: ____________ Date: ____________ Movements: ____________ Start time: ____________ Doreatha Martin time: ____________ Date: ____________ Movements: ____________ Start time: ____________ Doreatha Martin time: ____________ Date: ____________ Movements: ____________ Start time: ____________ Doreatha Martin time: ____________ Date: ____________ Movements: ____________ Start time: ____________ Doreatha Martin time: ____________ Date: ____________ Movements: ____________ Start time: ____________ Doreatha Martin time: ____________ Date: ____________ Movements: ____________ Start time: ____________ Doreatha Martin time: ____________  Date: ____________ Movements: ____________ Start time: ____________ Doreatha Martin time: ____________ Date: ____________ Movements: ____________ Start time: ____________ Doreatha Martin time: ____________ Date: ____________ Movements: ____________ Start time: ____________ Doreatha Martin time: ____________ Date: ____________ Movements: ____________ Start time: ____________ Doreatha Martin time: ____________ Date: ____________ Movements: ____________ Start time: ____________ Doreatha Martin time: ____________ Date: ____________ Movements: ____________ Start time: ____________ Doreatha Martin time: ____________ Date: ____________ Movements: ____________ Start time: ____________ Doreatha Martin time: ____________  Date: ____________ Movements: ____________ Start time: ____________ Doreatha Martin time: ____________ Date: ____________ Movements: ____________ Start time: ____________ Doreatha Martin time: ____________ Date: ____________ Movements: ____________ Start time:  ____________ Doreatha Martin time: ____________ Date: ____________ Movements: ____________ Start time: ____________ Doreatha Martin time: ____________ Date: ____________ Movements: ____________ Start time: ____________ Doreatha Martin time: ____________ Date: ____________ Movements: ____________ Start time: ____________ Doreatha Martin time: ____________ Date: ____________ Movements: ____________ Start time: ____________ Doreatha Martin time: ____________  Date: ____________ Movements: ____________ Start time: ____________ Doreatha Martin time: ____________ Date: ____________ Movements: ____________ Start time: ____________ Doreatha Martin time: ____________ Date: ____________ Movements: ____________ Start time: ____________ Doreatha Martin time: ____________ Date: ____________ Movements: ____________ Start time: ____________ Doreatha Martin time: ____________ Date: ____________ Movements: ____________ Start time: ____________ Doreatha Martin time: ____________ Date: ____________ Movements: ____________ Start time: ____________ Doreatha Martin time: ____________ Date: ____________ Movements: ____________ Start time: ____________ Doreatha Martin time: ____________  Date: ____________ Movements: ____________ Start time: ____________ Doreatha Martin time: ____________ Date: ____________ Movements: ____________ Start time: ____________ Doreatha Martin time: ____________ Date: ____________ Movements: ____________ Start time: ____________ Doreatha Martin time: ____________ Date: ____________ Movements: ____________ Start time: ____________ Doreatha Martin time: ____________ Date: ____________ Movements: ____________ Start time: ____________ Doreatha Martin time: ____________ Date: ____________ Movements: ____________ Start time: ____________ Doreatha Martin time: ____________ Date: ____________ Movements: ____________ Start time: ____________ Doreatha Martin time: ____________  Date: ____________ Movements: ____________ Start time: ____________ Doreatha Martin time: ____________ Date: ____________ Movements: ____________ Start time: ____________ Doreatha Martin time: ____________ Date:  ____________ Movements: ____________ Start time: ____________ Doreatha Martin time: ____________ Date: ____________ Movements: ____________ Start time: ____________ Doreatha Martin time: ____________ Date: ____________ Movements: ____________ Start time: ____________ Doreatha Martin time: ____________ Date: ____________  Movements: ____________ Start time: ____________ Doreatha MartinFinish time: ____________ Date: ____________ Movements: ____________ Start time: ____________ Doreatha MartinFinish time: ____________  Date: ____________ Movements: ____________ Start time: ____________ Doreatha MartinFinish time: ____________ Date: ____________ Movements: ____________ Start time: ____________ Doreatha MartinFinish time: ____________ Date: ____________ Movements: ____________ Start time: ____________ Doreatha MartinFinish time: ____________ Date: ____________ Movements: ____________ Start time: ____________ Doreatha MartinFinish time: ____________ Date: ____________ Movements: ____________ Start time: ____________ Doreatha MartinFinish time: ____________ Date: ____________ Movements: ____________ Start time: ____________ Doreatha MartinFinish time: ____________ Date: ____________ Movements: ____________ Start time: ____________ Doreatha MartinFinish time: ____________  Date: ____________ Movements: ____________ Start time: ____________ Doreatha MartinFinish time: ____________ Date: ____________ Movements: ____________ Start time: ____________ Doreatha MartinFinish time: ____________ Date: ____________ Movements: ____________ Start time: ____________ Doreatha MartinFinish time: ____________ Date: ____________ Movements: ____________ Start time: ____________ Doreatha MartinFinish time: ____________ Date: ____________ Movements: ____________ Start time: ____________ Doreatha MartinFinish time: ____________ Date: ____________ Movements: ____________ Start time: ____________ Doreatha MartinFinish time: ____________ Document Released: 11/07/2006 Document Revised: 02/22/2014 Document Reviewed: 08/04/2012 [employer] Patient Information 2015 [place]. This information is not intended to replace advice given to you by your health care provider. Make  sure you discuss any questions you have with your health care provider.  Chest Pain (Nonspecific) It is often hard to give a specific diagnosis for the cause of chest pain. There is always a chance that your pain could be related to something serious, such as a heart attack or a blood clot in the lungs. You need to follow up with your health care provider for further evaluation. CAUSES   Heartburn.  Pneumonia or bronchitis.  Anxiety or stress.  Inflammation around your heart (pericarditis) or lung (pleuritis or pleurisy).  A blood clot in the lung.  A collapsed lung (pneumothorax). It can develop suddenly on its own (spontaneous pneumothorax) or from trauma to the chest.  Shingles infection (herpes zoster virus). The chest wall is composed of bones, muscles, and cartilage. Any of these can be the source of the pain.  The bones can be bruised by injury.  The muscles or cartilage can be strained by coughing or overwork.  The cartilage can be affected by inflammation and become sore (costochondritis). DIAGNOSIS  Lab tests or other studies may be needed to find the cause of your pain. Your health care provider may have you take a test called an ambulatory electrocardiogram (ECG). An ECG records your heartbeat patterns over a 24-hour period. You may also have other tests, such as:  Transthoracic echocardiogram (TTE). During echocardiography, sound waves are used to evaluate how blood flows through your heart.  Transesophageal echocardiogram (TEE).  Cardiac monitoring. This allows your health care provider to monitor your heart rate and rhythm in real time.  Holter monitor. This is a portable device that records your heartbeat and can help diagnose heart arrhythmias. It allows your health care provider to track your heart activity for several days, if needed.  Stress tests by exercise or by giving medicine that makes the heart beat faster. TREATMENT   Treatment depends on what may  be causing your chest pain. Treatment may include:  Acid blockers for heartburn.  Anti-inflammatory medicine.  Pain medicine for inflammatory conditions.  Antibiotics if an infection is present.  You may be advised to change lifestyle habits. This includes stopping smoking and avoiding alcohol, caffeine, and chocolate.  You may be advised to keep your head raised (elevated) when sleeping. This reduces the chance of acid going backward from your stomach into your esophagus. Most of the time, nonspecific chest pain will improve within 2-3 days with rest and  mild pain medicine.  HOME CARE INSTRUCTIONS   If antibiotics were prescribed, take them as directed. Finish them even if you start to feel better.  For the next few days, avoid physical activities that bring on chest pain. Continue physical activities as directed.  Do not use any tobacco products, including cigarettes, chewing tobacco, or electronic cigarettes.  Avoid drinking alcohol.  Only take medicine as directed by your health care provider.  Follow your health care provider's suggestions for further testing if your chest pain does not go away.  Keep any follow-up appointments you made. If you do not go to an appointment, you could develop lasting (chronic) problems with pain. If there is any problem keeping an appointment, call to reschedule. SEEK MEDICAL CARE IF:   Your chest pain does not go away, even after treatment.  You have a rash with blisters on your chest.  You have a fever. SEEK IMMEDIATE MEDICAL CARE IF:   You have increased chest pain or pain that spreads to your arm, neck, jaw, back, or abdomen.  You have shortness of breath.  You have an increasing cough, or you cough up blood.  You have severe back or abdominal pain.  You feel nauseous or vomit.  You have severe weakness.  You faint.  You have chills. This is an emergency. Do not wait to see if the pain will go away. Get medical help at  once. Call your local emergency services (911 in [place] Do not drive yourself to the hospital. MAKE SURE YOU:   Understand these instructions.  Will watch your condition.  Will get help right away if you are not doing well or get worse. Document Released: 07/18/2005 Document Revised: 10/13/2013 Document Reviewed: 05/13/2008 [facility] Patient Information 2015 [place], [place]. This information is not intended to replace advice given to you by your health care provider. Make sure you discuss any questions you have with your health care provider.

## 2014-08-28 NOTE — Progress Notes (Signed)
Utilization Review completed.  

## 2014-08-28 NOTE — Progress Notes (Signed)
Patient given incentive spirometer to use she demonstrated use.

## 2014-08-28 NOTE — Progress Notes (Signed)
Patient ID: Rachel PersonsBrianna Sanders, female   DOB: 1991/07/18, 23 y.o.   MRN: [id number] BP 118/52 mmHg  Pulse 129  Temp(Src) 98.1 F (36.7 C) (Oral)  Resp 18  Ht 5\' 4"  (1.626 m)  Wt 81.647 kg (180 lb)  BMI 30.88 kg/m2  SpO2 97%  LMP 01/06/2014 Pt is still tachycardic.  She is well hydrated.  She has received blood and fetal status is reassuring.  She does have some mild chest pain.  Cardiology was consulted and they recommend spiral CT and echocardiogram.
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
## 2014-08-28 NOTE — Progress Notes (Signed)
  Echocardiogram 2D Echocardiogram has been performed.  Janalyn HarderWest, Encarnacion Bole R 08/28/2014, 3:49 PM

## 2014-08-28 NOTE — Progress Notes (Signed)
Patient ready for discharge. Using the teach back method she was taught to return to the [facility] with persistent chest pain, contractions, or bleeding.  Her discharge summary was gone over fully and she states understanding of discharge instructions.

## 2014-08-28 NOTE — Progress Notes (Signed)
Dr Normand Sloopillard in to evaluate patient for discharge.  She was notified of patients tachycardia. Reactive NST with mild intermittent contractions at this time.  We will take patient off the EFM.

## 2014-08-29 ENCOUNTER — Inpatient Hospital Stay (HOSPITAL_COMMUNITY)
Admission: AD | Admit: 2014-08-29 | Discharge: 2014-08-29 | Disposition: A | Discharge status: Home / Self Care | Payer: Medicaid Other | Source: Ambulatory Visit | Attending: Obstetrics and Gynecology | Admitting: Obstetrics and Gynecology

## 2014-08-29 ENCOUNTER — Encounter (HOSPITAL_COMMUNITY): Payer: Self-pay

## 2014-08-29 DIAGNOSIS — Z3A30 30 weeks gestation of pregnancy: Secondary | ICD-10-CM | POA: Insufficient documentation

## 2014-08-29 DIAGNOSIS — R51 Headache: Secondary | ICD-10-CM | POA: Insufficient documentation

## 2014-08-29 DIAGNOSIS — O26893 Other specified pregnancy related conditions, third trimester: Secondary | ICD-10-CM | POA: Insufficient documentation

## 2014-08-29 DIAGNOSIS — R0602 Shortness of breath: Secondary | ICD-10-CM | POA: Insufficient documentation

## 2014-08-29 LAB — URINE MICROSCOPIC-ADD ON

## 2014-08-29 LAB — URINALYSIS, ROUTINE W REFLEX MICROSCOPIC
Bilirubin Urine: NEGATIVE
Glucose, UA: NEGATIVE mg/dL
Hgb urine dipstick: NEGATIVE
Ketones, ur: NEGATIVE mg/dL
Nitrite: NEGATIVE
Protein, ur: NEGATIVE mg/dL
SPECIFIC GRAVITY, URINE: 1.01 (ref 1.005–1.030)
UROBILINOGEN UA: 0.2 mg/dL (ref 0.0–1.0)
pH: 7 (ref 5.0–8.0)

## 2014-08-29 MED ORDER — OXYCODONE-ACETAMINOPHEN 5-325 MG PO TABS: 1.0000 | ORAL_TABLET | ORAL | Status: DC | PRN

## 2014-08-29 MED ORDER — IBUPROFEN 600 MG PO TABS
600.0000 mg | ORAL_TABLET | Freq: Once | ORAL | Status: AC
  Administered 2014-08-29: 600 mg via ORAL
  Filled 2014-08-29: qty 1

## 2014-08-29 NOTE — Progress Notes (Signed)
Notified of pt pain relief with ibuprofen. Will come see pt

## 2014-08-29 NOTE — MAU Note (Signed)
Pt presents to MAU with complaints of a headache and SOB today
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
## 2014-08-29 NOTE — Progress Notes (Signed)
Called per [facility] to evaluate pt in MAU because CNM was in delivery. Informed of pt complaints and received orders to give pt Ibuprofen and wait for CNM to see pt

## 2014-08-29 NOTE — MAU Note (Signed)
Pt presents complaining of shortness of breath and a headache. States she's had SOB since Thursday when she was admitted in the hospital. It is not worse, just still there. States they did a CT while admitted and saw pneumonia and she wonders if that is causing the SOB. O2 at 100% on room air. States she also has a headache that she rates 9/10. Has tried Tylenol but it is not working.

## 2014-08-29 NOTE — Discharge Instructions (Signed)
Preterm Labor Information °Preterm labor is when labor starts at less than 37 weeks of pregnancy. The normal length of a pregnancy is 39 to 41 weeks. °CAUSES °Often, there is no identifiable underlying cause as to why a woman goes into preterm labor. One of the most common known causes of preterm labor is infection. Infections of the uterus, cervix, vagina, amniotic sac, bladder, kidney, or even the lungs (pneumonia) can cause labor to start. Other suspected causes of preterm labor include:  °· Urogenital infections, such as yeast infections and bacterial vaginosis.   °· Uterine abnormalities (uterine shape, uterine septum, fibroids, or bleeding from the placenta).   °· A cervix that has been operated on (it may fail to stay closed).   °· Malformations in the fetus.   °· Multiple gestations (twins, triplets, and so on).   °· Breakage of the amniotic sac.   °RISK FACTORS °· Having a previous history of preterm labor.   °· Having premature rupture of membranes (PROM).   °· Having a placenta that covers the opening of the cervix (placenta previa).   °· Having a placenta that separates from the uterus (placental abruption).   °· Having a cervix that is too weak to hold the fetus in the uterus (incompetent cervix).   °· Having too much fluid in the amniotic sac (polyhydramnios).   °· Taking illegal drugs or smoking while pregnant.   °· Not gaining enough weight while pregnant.   °· Being younger than 18 and older than 21 years old.   °· Having a low socioeconomic status.   °· Being African American. °SYMPTOMS °Signs and symptoms of preterm labor include:  °· Menstrual-like cramps, abdominal pain, or back pain. °· Uterine contractions that are regular, as frequent as six in an hour, regardless of their intensity (may be mild or painful). °· Contractions that start on the top of the uterus and spread down to the lower abdomen and back.   °· A sense of increased pelvic pressure.   °· A watery or bloody mucus discharge that  comes from the vagina.   °TREATMENT °Depending on the length of the pregnancy and other circumstances, your health care provider may suggest bed rest. If necessary, there are medicines that can be given to stop contractions and to mature the fetal lungs. If labor happens before 34 weeks of pregnancy, a prolonged hospital stay may be recommended. Treatment depends on the condition of both you and the fetus.  °WHAT SHOULD YOU DO IF YOU THINK YOU ARE IN PRETERM LABOR? °Call your health care provider right away. You will need to go to the hospital to get checked immediately. °HOW CAN YOU PREVENT PRETERM LABOR IN FUTURE PREGNANCIES? °You should:  °· Stop smoking if you smoke.  °· Maintain healthy weight gain and avoid chemicals and drugs that are not necessary. °· Be watchful for any type of infection. °· Inform your health care provider if you have a known history of preterm labor. °Document Released: 12/29/2003 Document Revised: 06/10/2013 Document Reviewed: 11/10/2012 °ExitCare® Patient Information ©2015 [employer]. This information is not intended to replace advice given to you by your health care provider. Make sure you discuss any questions you have with your health care provider. °Fetal Movement Counts °Patient Name: __________________________________________________ Patient Due Date: ____________________ °Performing a fetal movement count is highly recommended in high-risk pregnancies, but it is good for every pregnant woman to do. Your health care provider may ask you to start counting fetal movements at 28 weeks of the pregnancy. Fetal movements often increase: °· After eating a full meal. °· After physical activity. °· After   eating or drinking something sweet or cold. °· At rest. °Pay attention to when you feel the baby is most active. This will help you notice a pattern of your baby's sleep and wake cycles and what factors contribute to an increase in fetal movement. It is important to perform a fetal  movement count at the same time each day when your baby is normally most active.  °HOW TO COUNT FETAL MOVEMENTS °· Find a quiet and comfortable area to sit or lie down on your left side. Lying on your left side provides the best blood and oxygen circulation to your baby. °· Write down the day and time on a sheet of paper or in a journal. °· Start counting kicks, flutters, swishes, rolls, or jabs in a 2-hour period. You should feel at least 10 movements within 2 hours. °· If you do not feel 10 movements in 2 hours, wait 2-3 hours and count again. Look for a change in the pattern or not enough counts in 2 hours. °SEEK MEDICAL CARE IF: °· You feel less than 10 counts in 2 hours, tried twice. °· There is no movement in over an hour. °· The pattern is changing or taking longer each day to reach 10 counts in 2 hours. °· You feel the baby is not moving as he or she usually does. °Date: ____________ Movements: ____________ Start time: ____________ Finish time: ____________  °Date: ____________ Movements: ____________ Start time: ____________ Finish time: ____________ °Date: ____________ Movements: ____________ Start time: ____________ Finish time: ____________ °Date: ____________ Movements: ____________ Start time: ____________ Finish time: ____________ °Date: ____________ Movements: ____________ Start time: ____________ Finish time: ____________ °Date: ____________ Movements: ____________ Start time: ____________ Finish time: ____________ °Date: ____________ Movements: ____________ Start time: ____________ Finish time: ____________ °Date: ____________ Movements: ____________ Start time: ____________ Finish time: ____________  °Date: ____________ Movements: ____________ Start time: ____________ Finish time: ____________ °Date: ____________ Movements: ____________ Start time: ____________ Finish time: ____________ °Date: ____________ Movements: ____________ Start time: ____________ Finish time: ____________ °Date:  ____________ Movements: ____________ Start time: ____________ Finish time: ____________ °Date: ____________ Movements: ____________ Start time: ____________ Finish time: ____________ °Date: ____________ Movements: ____________ Start time: ____________ Finish time: ____________ °Date: ____________ Movements: ____________ Start time: ____________ Finish time: ____________  °Date: ____________ Movements: ____________ Start time: ____________ Finish time: ____________ °Date: ____________ Movements: ____________ Start time: ____________ Finish time: ____________ °Date: ____________ Movements: ____________ Start time: ____________ Finish time: ____________ °Date: ____________ Movements: ____________ Start time: ____________ Finish time: ____________ °Date: ____________ Movements: ____________ Start time: ____________ Finish time: ____________ °Date: ____________ Movements: ____________ Start time: ____________ Finish time: ____________ °Date: ____________ Movements: ____________ Start time: ____________ Finish time: ____________  °Date: ____________ Movements: ____________ Start time: ____________ Finish time: ____________ °Date: ____________ Movements: ____________ Start time: ____________ Finish time: ____________ °Date: ____________ Movements: ____________ Start time: ____________ Finish time: ____________ °Date: ____________ Movements: ____________ Start time: ____________ Finish time: ____________ °Date: ____________ Movements: ____________ Start time: ____________ Finish time: ____________ °Date: ____________ Movements: ____________ Start time: ____________ Finish time: ____________ °Date: ____________ Movements: ____________ Start time: ____________ Finish time: ____________  °Date: ____________ Movements: ____________ Start time: ____________ Finish time: ____________ °Date: ____________ Movements: ____________ Start time: ____________ Finish time: ____________ °Date: ____________ Movements: ____________ Start  time: ____________ Finish time: ____________ °Date: ____________ Movements: ____________ Start time: ____________ Finish time: ____________ °Date: ____________ Movements: ____________ Start time: ____________ Finish time: ____________ °Date: ____________ Movements: ____________ Start time: ____________ Finish time: ____________ °Date: ____________ Movements: ____________ Start time: ____________ Finish time: ____________  °Date:   ____________ Movements: ____________ Start time: ____________ Finish time: ____________ °Date: ____________ Movements: ____________ Start time: ____________ Finish time: ____________ °Date: ____________ Movements: ____________ Start time: ____________ Finish time: ____________ °Date: ____________ Movements: ____________ Start time: ____________ Finish time: ____________ °Date: ____________ Movements: ____________ Start time: ____________ Finish time: ____________ °Date: ____________ Movements: ____________ Start time: ____________ Finish time: ____________ °Date: ____________ Movements: ____________ Start time: ____________ Finish time: ____________  °Date: ____________ Movements: ____________ Start time: ____________ Finish time: ____________ °Date: ____________ Movements: ____________ Start time: ____________ Finish time: ____________ °Date: ____________ Movements: ____________ Start time: ____________ Finish time: ____________ °Date: ____________ Movements: ____________ Start time: ____________ Finish time: ____________ °Date: ____________ Movements: ____________ Start time: ____________ Finish time: ____________ °Date: ____________ Movements: ____________ Start time: ____________ Finish time: ____________ °Date: ____________ Movements: ____________ Start time: ____________ Finish time: ____________  °Date: ____________ Movements: ____________ Start time: ____________ Finish time: ____________ °Date: ____________ Movements: ____________ Start time: ____________ Finish time:  ____________ °Date: ____________ Movements: ____________ Start time: ____________ Finish time: ____________ °Date: ____________ Movements: ____________ Start time: ____________ Finish time: ____________ °Date: ____________ Movements: ____________ Start time: ____________ Finish time: ____________ °Date: ____________ Movements: ____________ Start time: ____________ Finish time: ____________ °Document Released: 11/07/2006 Document Revised: 02/22/2014 Document Reviewed: 08/04/2012 °ExitCare® Patient Information ©2015 [employer]. This information is not intended to replace advice given to you by your health care provider. Make sure you discuss any questions you have with your health care provider. ° °

## 2014-09-01 LAB — US OB COMP + 14 WK

## 2014-09-15 ENCOUNTER — Ambulatory Visit (INDEPENDENT_AMBULATORY_CARE_PROVIDER_SITE_OTHER): Payer: 59 | Admitting: Interventional Cardiology

## 2014-09-15 ENCOUNTER — Encounter: Payer: Self-pay | Admitting: Interventional Cardiology

## 2014-09-15 VITALS — BP 120/62 | HR 98 | Ht 65.0 in | Wt 184.1 lb

## 2014-09-15 DIAGNOSIS — R079 Chest pain, unspecified: Secondary | ICD-10-CM | POA: Diagnosis not present
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
## 2014-09-15 NOTE — Patient Instructions (Signed)
Your physician recommends that you continue on your current medications as directed. Please refer to the Current Medication list given to you today.  No follow up is needed at this time with cardiology.

## 2014-09-19 ENCOUNTER — Encounter (HOSPITAL_COMMUNITY): Payer: Self-pay | Admitting: Emergency Medicine

## 2014-09-19 DIAGNOSIS — Z3A33 33 weeks gestation of pregnancy: Secondary | ICD-10-CM | POA: Insufficient documentation

## 2014-09-19 DIAGNOSIS — R000 Tachycardia, unspecified: Secondary | ICD-10-CM | POA: Diagnosis not present

## 2014-09-19 DIAGNOSIS — O99513 Diseases of the respiratory system complicating pregnancy, third trimester: Secondary | ICD-10-CM | POA: Insufficient documentation

## 2014-09-19 DIAGNOSIS — J40 Bronchitis, not specified as acute or chronic: Secondary | ICD-10-CM | POA: Diagnosis not present

## 2014-09-19 DIAGNOSIS — Z79899 Other long term (current) drug therapy: Secondary | ICD-10-CM | POA: Insufficient documentation

## 2014-09-19 NOTE — ED Notes (Signed)
Pt is [redacted] weeks pregnant and c/o constant sharp pain across chest that radiates to back x 2 days.  States pain is worse when she coughs.  Reports productive cough with clear/white sputum.  States she has had cough x 1 month and taking Deslym without relief.

## 2014-09-20 ENCOUNTER — Emergency Department (HOSPITAL_COMMUNITY)
Admission: EM | Admit: 2014-09-20 | Discharge: 2014-09-20 | Disposition: A | Discharge status: Home / Self Care | Payer: Medicaid Other | Attending: Emergency Medicine | Admitting: Emergency Medicine

## 2014-09-20 ENCOUNTER — Emergency Department (HOSPITAL_COMMUNITY): Payer: Medicaid Other

## 2014-09-20 DIAGNOSIS — J40 Bronchitis, not specified as acute or chronic: Secondary | ICD-10-CM

## 2014-09-20 DIAGNOSIS — R05 Cough: Secondary | ICD-10-CM

## 2014-09-20 DIAGNOSIS — R0789 Other chest pain: Secondary | ICD-10-CM

## 2014-09-20 DIAGNOSIS — R059 Cough, unspecified: Secondary | ICD-10-CM

## 2014-09-20 MED ORDER — ALBUTEROL SULFATE HFA 108 (90 BASE) MCG/ACT IN AERS: 1.0000 | INHALATION_SPRAY | RESPIRATORY_TRACT | Status: DC | PRN

## 2014-09-20 MED ORDER — GUAIFENESIN-CODEINE 100-10 MG/5ML PO SOLN
10.0000 mL | Freq: Once | ORAL | Status: AC
  Administered 2014-09-20: 5 mL via ORAL
  Filled 2014-09-20: qty 10

## 2014-09-20 MED ORDER — ALBUTEROL SULFATE HFA 108 (90 BASE) MCG/ACT IN AERS
1.0000 | INHALATION_SPRAY | RESPIRATORY_TRACT | Status: DC | PRN
  Administered 2014-09-20: 2 via RESPIRATORY_TRACT
  Filled 2014-09-20: qty 6.7

## 2014-09-20 MED ORDER — HYDROCOD POLST-CHLORPHEN POLST 10-8 MG/5ML PO LQCR
5.0000 mL | Freq: Once | ORAL | Status: DC
Start: 2014-09-20 — End: 2014-09-20

## 2014-09-20 MED ORDER — GUAIFENESIN-CODEINE 100-10 MG/5ML PO SOLN: 5.0000 mL | ORAL | Status: DC | PRN

## 2014-09-20 NOTE — Progress Notes (Signed)
Pt off monitor to go to xray 

## 2014-09-20 NOTE — ED Notes (Signed)
See edps notes she saw beofre myself.  Pt ill for 3 days 33 weeks preg.  Cough cold.  Ob response rn enroute here

## 2014-09-20 NOTE — ED Notes (Signed)
Pt returned from xray.  Ob rapid response rn has cleared the  Pt
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
## 2014-09-20 NOTE — Discharge Instructions (Signed)
Chest Wall Pain °Chest wall pain is pain in or around the bones and muscles of your chest. It may take up to 6 weeks to get better. It may take longer if you must stay physically active in your work and activities.  °CAUSES  °Chest wall pain may happen on its own. However, it may be caused by: °· A viral illness like the flu. °· Injury. °· Coughing. °· Exercise. °· Arthritis. °· Fibromyalgia. °· Shingles. °HOME CARE INSTRUCTIONS  °· Avoid overtiring physical activity. Try not to strain or perform activities that cause pain. This includes any activities using your chest or your abdominal and side muscles, especially if heavy weights are used. °· Put ice on the sore area. °¨ Put ice in a plastic bag. °¨ Place a towel between your skin and the bag. °¨ Leave the ice on for 15-20 minutes per hour while awake for the first 2 days. °· Only take over-the-counter or prescription medicines for pain, discomfort, or fever as directed by your caregiver. °SEEK IMMEDIATE MEDICAL CARE IF:  °· Your pain increases, or you are very uncomfortable. °· You have a fever. °· Your chest pain becomes worse. °· You have new, unexplained symptoms. °· You have nausea or vomiting. °· You feel sweaty or lightheaded. °· You have a cough with phlegm (sputum), or you cough up blood. °MAKE SURE YOU:  °· Understand these instructions. °· Will watch your condition. °· Will get help right away if you are not doing well or get worse. °Document Released: 10/08/2005 Document Revised: 12/31/2011 Document Reviewed: 06/04/2011 °ExitCare® Patient Information ©2015 [employer]. This information is not intended to replace advice given to you by your health care provider. Make sure you discuss any questions you have with your health care provider. ° °Cough, Adult ° A cough is a reflex that helps clear your throat and airways. It can help heal the body or may be a reaction to an irritated airway. A cough may only last 2 or 3 weeks (acute) or may last more than 8  weeks (chronic).  °CAUSES °Acute cough: °· Viral or bacterial infections. °Chronic cough: °· Infections. °· Allergies. °· Asthma. °· Post-nasal drip. °· Smoking. °· Heartburn or acid reflux. °· Some medicines. °· Chronic lung problems (COPD). °· Cancer. °SYMPTOMS  °· Cough. °· Fever. °· Chest pain. °· Increased breathing rate. °· High-pitched whistling sound when breathing (wheezing). °· Colored mucus that you cough up (sputum). °TREATMENT  °· A bacterial cough may be treated with antibiotic medicine. °· A viral cough must run its course and will not respond to antibiotics. °· Your caregiver may recommend other treatments if you have a chronic cough. °HOME CARE INSTRUCTIONS  °· Only take over-the-counter or prescription medicines for pain, discomfort, or fever as directed by your caregiver. Use cough suppressants only as directed by your caregiver. °· Use a cold steam vaporizer or humidifier in your bedroom or home to help loosen secretions. °· Sleep in a semi-upright position if your cough is worse at night. °· Rest as needed. °· Stop smoking if you smoke. °SEEK IMMEDIATE MEDICAL CARE IF:  °· You have pus in your sputum. °· Your cough starts to worsen. °· You cannot control your cough with suppressants and are losing sleep. °· You begin coughing up blood. °· You have difficulty breathing. °· You develop pain which is getting worse or is uncontrolled with medicine. °· You have a fever. °MAKE SURE YOU:  °· Understand these instructions. °· Will watch your   condition. °· Will get help right away if you are not doing well or get worse. °Document Released: 04/06/2011 Document Revised: 12/31/2011 Document Reviewed: 04/06/2011 °ExitCare® Patient Information ©2015 [employer]. This information is not intended to replace advice given to you by your health care provider. Make sure you discuss any questions you have with your health care provider. ° °

## 2014-09-20 NOTE — ED Provider Notes (Signed)
CSN: [id number]     Arrival date & time 09/19/14  2339 History   First MD Initiated Contact with Patient 09/20/14 0027     Chief Complaint  Patient presents with  . Chest Pain     (Consider location/radiation/quality/duration/timing/severity/associated sxs/prior Treatment) HPI 23 year old female presents to the emergency department with complaint of persistent cough and chest pain.  Patient reports she has a band of pain across the top of her chest and into her left shoulder.  Patient reports pain has been ongoing for some time associated with a cough that she has had for a month.  No fevers or chills.  No dizziness.  She also complains of a headache that is worse when she coughs.  Patient is currently [redacted] weeks pregnant.  She recently had admission and workup for chest pain including EKG and CT angio chest.  No PE was noted at that time.  Patient was seen in follow-up with cardiology.  4 days ago, at that time having chest pain thought to be musculoskeletal.  Patient has been taking Delsym without improvement.  She reports that she is taking Tylenol for headache without improvement.  [facility]  Patient reports that she was told that she may have a pneumonia on her chest CT scan earlier this month, but that was told that her lung was collapsed secondary to pregnancy. Past Medical History  Diagnosis Date  . No pertinent past medical history   . Pregnancy   . Medical history non-contributory    Past Surgical History  Procedure Laterality Date  . No past surgeries     Family History  Problem Relation Age of Onset  . Asthma Father    History  Substance Use Topics  . Smoking status: Never Smoker   . Smokeless tobacco: Never Used  . Alcohol Use: No   OB History    Gravida Para Term Preterm AB TAB SAB Ectopic Multiple Living   2 1 1  0 0 0 0 0 0 1     Review of Systems  See History of Present Illness; otherwise all other systems are reviewed and  negative   Allergies  Review of patient's allergies indicates no known allergies.  Home Medications   Prior to Admission medications   Medication Sig Start Date End Date Taking? Authorizing Provider  oxyCODONE-acetaminophen (PERCOCET) 5-325 MG per tablet Take 1 tablet by mouth every 4 (four) hours as needed for severe pain. 08/29/14   Venus Standard, CNM  Prenatal Vit-Fe Fumarate-FA (PRENATAL MULTIVITAMIN) TABS tablet Take 1 tablet by mouth daily at 12 noon. 08/28/14   Venus Standard, CNM   BP 117/60 mmHg  Pulse 121  Temp(Src) 98.2 F (36.8 C) (Oral)  Resp 22  Ht 5\' 5"  (1.651 m)  Wt 171 lb (77.565 kg)  BMI 28.46 kg/m2  SpO2 100%  LMP 01/06/2014 Physical Exam  Constitutional: She is oriented to person, place, and time. She appears well-developed and well-nourished.  HENT:  Head: Normocephalic and atraumatic.  Nose: Nose normal.  Mouth/Throat: Oropharynx is clear and moist.  Eyes: Conjunctivae and EOM are normal. Pupils are equal, round, and reactive to light.  Neck: Normal range of motion. Neck supple. No JVD present. No tracheal deviation present. No thyromegaly present.  Cardiovascular: Regular rhythm, normal heart sounds and intact distal pulses.  Exam reveals no gallop and no friction rub.   No murmur heard. Tachycardia  Pulmonary/Chest: Effort normal and breath sounds normal. No stridor. No respiratory distress. She has no  wheezes. She has no rales. She exhibits tenderness (patient has tenderness to palpation across anterior chest wall reproduces her pain. ).  Patient has tenderness to left upper back.  She is not tachypneic.  She is not in respiratory distress.  Abdominal: Soft. Bowel sounds are normal. She exhibits mass (gravid abdomen). She exhibits no distension. There is no tenderness. There is no rebound and no guarding.  Musculoskeletal: Normal range of motion. She exhibits no edema or tenderness.  Lymphadenopathy:    She has no cervical adenopathy.  Neurological: She  is alert and oriented to person, place, and time. She displays normal reflexes. She exhibits normal muscle tone. Coordination normal.  Skin: Skin is warm and dry. No rash noted. No erythema. No pallor.  Psychiatric: She has a normal mood and affect. Her behavior is normal. Judgment and thought content normal.  Nursing note and vitals reviewed.   ED Course  Procedures (including critical care time) Labs Review Labs Reviewed - No data to display  Imaging Review Dg Chest 2 View  09/20/2014   CLINICAL DATA:  Subacute onset of cough for the past month. Chest pain and posterior left back pain for 3 days. Initial encounter.  EXAM: CHEST  2 VIEW  COMPARISON:  Chest radiograph performed 08/26/2014, and CTA of the chest performed 08/28/2014  FINDINGS: The lungs are well-aerated. Mild peribronchial thickening is noted. There is no evidence of focal opacification, pleural effusion or pneumothorax.  The heart is borderline enlarged. No acute osseous abnormalities are seen.  IMPRESSION: Mild peribronchial thickening seen; borderline cardiomegaly noted.   Electronically Signed   By: Roanna RaiderJeffery  Chang M.D.   On: 09/20/2014 02:15     EKG Interpretation   Date/Time:  Sunday September 19 2014 23:44:33 EST Ventricular Rate:  119 PR Interval:  120 QRS Duration: 80 QT Interval:  310 QTC Calculation: 436 R Axis:   46 Text Interpretation:  Sinus tachycardia Otherwise normal ECG persistent  tachycardia Confirmed by Othman Masur  MD, Alann Avey ([id number]) on 09/20/2014 12:24:24 AM      MDM   Final diagnoses:  Cough  Bronchitis  Chest wall pain    [age] year old female with ongoing cough and chest wall pain.  She does have tachycardia, reviewing records, she has been persistently tachycardic.  She has had a CT angiogram of the chest done this month without PE.  She is not tachypneic or having pleuritic type chest pain.  Repeat chest x-ray given questionable infiltrate seen on CT.  Will try to quiet her cough.  2:29  AM   Peribronchial thickening seen on chest x-ray seems consistent with bronchitis.  Given persistence of cough, will do guaifenesin with codeine, and albuterol when necessary.  Patient to follow-up with OB.  Rapid response nurse, has monitored the pregnancy, fetus appears well.  On monitoring.  Patient is stable for discharge home.    Olivia Mackielga M Aster Screws, MD 09/20/14 [id number]

## 2014-09-20 NOTE — ED Notes (Signed)
Ob rapid response rn here to see the pt

## 2014-10-22 NOTE — L&D Delivery Note (Signed)
Vaginal Delivery Note The pt did not have an epidural as pain management.   Spontaneous rupture of membranes today, at 1341, clear.  GBS was positive, AMP x 1 doses was given.  Cervical dilation was complete at 1328.     Spontaneous pushing began at 1339.   After 10 minutes of pushing the head, shoulders and the body of a viable female infant "unnamed" delivered spontaneously with maternal effort in the ROA position at 1349.   With vigorous tone and spontaneous cry, the infant was placed on moms abd.  After the umbilical cord was clamped it was cut by the FOB, then cord blood was obtained for evaluation.  Spontaneous delivery of a intact placenta with a 3 vessel cord via Shultz at 1355.   Episiotomy: None   The vulva, perineum, vaginal vault, rectum and cervix were inspected and revealed a superficial right periurethral laceration  hemostatic, not repaired.   Postpartum pitocin as ordered.  Fundus firm, lochia minimum, bleeding under control. EBL 200, Pt hemodynamically stable.   Sponge, laps and needle count correct and verified with the primary care nurse.  Attending MD available at all times.    Mom and baby were left in stable condition, baby skin to skin. Routine postpartum orders   Mother unsure about method of contraception   Infant to have out patient circumcision   Placenta to pathology: NO     Cord Gases sent to lab: NO Cord blood sent to lab: YES   APGARS:  9 at 1 minute and 9 at 5 minutes Weight:.  7lb 15.7oz     Arizbeth Cawthorn, CNM, MSN 10/24/2014. 2:11 PM

## 2014-10-24 ENCOUNTER — Encounter (HOSPITAL_COMMUNITY): Payer: Self-pay | Admitting: *Deleted

## 2014-10-24 ENCOUNTER — Inpatient Hospital Stay (HOSPITAL_COMMUNITY)
Admission: AD | Admit: 2014-10-24 | Discharge: 2014-10-26 | DRG: 775 | Disposition: A | Discharge status: Home / Self Care | Payer: Medicaid Other | Source: Ambulatory Visit | Attending: Obstetrics & Gynecology | Admitting: Obstetrics & Gynecology

## 2014-10-24 DIAGNOSIS — O99824 Streptococcus B carrier state complicating childbirth: Secondary | ICD-10-CM | POA: Diagnosis present

## 2014-10-24 DIAGNOSIS — Z3A38 38 weeks gestation of pregnancy: Secondary | ICD-10-CM | POA: Diagnosis present

## 2014-10-24 DIAGNOSIS — Z3483 Encounter for supervision of other normal pregnancy, third trimester: Secondary | ICD-10-CM | POA: Diagnosis present

## 2014-10-24 HISTORY — DX: Other specified diseases and conditions complicating pregnancy, childbirth and the puerperium: O99.89

## 2014-10-24 HISTORY — DX: Anemia, unspecified: D64.9

## 2014-10-24 HISTORY — DX: Other specified diseases and conditions complicating pregnancy: O99.891

## 2014-10-24 LAB — CBC
HCT: 35 % — ABNORMAL LOW (ref 36.0–46.0)
HEMATOCRIT: 32.6 % — AB (ref 36.0–46.0)
HEMOGLOBIN: 11.2 g/dL — AB (ref 12.0–15.0)
Hemoglobin: 11.8 g/dL — ABNORMAL LOW (ref 12.0–15.0)
MCH: 31.4 pg (ref 26.0–34.0)
MCH: 31.8 pg (ref 26.0–34.0)
MCHC: 33.7 g/dL (ref 30.0–36.0)
MCHC: 34.4 g/dL (ref 30.0–36.0)
MCV: 93.1 fL (ref 78.0–100.0)
MCV: 92.6 fL (ref 78.0–100.0)
PLATELETS: 310 10*3/uL (ref 150–400)
PLATELETS: 296 10*3/uL (ref 150–400)
RBC: 3.76 MIL/uL — ABNORMAL LOW (ref 3.87–5.11)
RBC: 3.52 MIL/uL — AB (ref 3.87–5.11)
RDW: 13.1 % (ref 11.5–15.5)
RDW: 13 % (ref 11.5–15.5)
WBC: 13.1 10*3/uL — ABNORMAL HIGH (ref 4.0–10.5)
WBC: 15.1 10*3/uL — AB (ref 4.0–10.5)

## 2014-10-24 LAB — HIV ANTIBODY (ROUTINE TESTING W REFLEX): HIV: NONREACTIVE

## 2014-10-24 LAB — RPR

## 2014-10-24 LAB — PREPARE RBC (CROSSMATCH)

## 2014-10-24 MED ORDER — OXYCODONE-ACETAMINOPHEN 5-325 MG PO TABS
1.0000 | ORAL_TABLET | ORAL | Status: DC | PRN
  Administered 2014-10-24 – 2014-10-25 (×2): 1 via ORAL
  Filled 2014-10-24 (×3): qty 1

## 2014-10-24 MED ORDER — OXYTOCIN 40 UNITS IN LACTATED RINGERS INFUSION - SIMPLE MED
INTRAVENOUS | Status: AC
  Administered 2014-10-24: 62.5 mL/h via INTRAVENOUS
  Filled 2014-10-24: qty 1000

## 2014-10-24 MED ORDER — ONDANSETRON HCL 4 MG/2ML IJ SOLN: 4.0000 mg | INTRAMUSCULAR | Status: DC | PRN

## 2014-10-24 MED ORDER — METHYLERGONOVINE MALEATE 0.2 MG/ML IJ SOLN
INTRAMUSCULAR | Status: AC
  Administered 2014-10-24: 0.2 mg via INTRAMUSCULAR
  Filled 2014-10-24: qty 1

## 2014-10-24 MED ORDER — BUTORPHANOL TARTRATE 1 MG/ML IJ SOLN
1.0000 mg | Freq: Once | INTRAMUSCULAR | Status: AC
  Administered 2014-10-24: 1 mg via INTRAVENOUS

## 2014-10-24 MED ORDER — LIDOCAINE HCL (PF) 1 % IJ SOLN
INTRAMUSCULAR | Status: AC
  Filled 2014-10-24: qty 30

## 2014-10-24 MED ORDER — TETANUS-DIPHTH-ACELL PERTUSSIS 5-2.5-18.5 LF-MCG/0.5 IM SUSP: 0.5000 mL | Freq: Once | INTRAMUSCULAR | Status: DC

## 2014-10-24 MED ORDER — CITRIC ACID-SODIUM CITRATE 334-500 MG/5ML PO SOLN: 30.0000 mL | ORAL | Status: DC | PRN

## 2014-10-24 MED ORDER — METHYLERGONOVINE MALEATE 0.2 MG/ML IJ SOLN
0.2000 mg | INTRAMUSCULAR | Status: AC | PRN
  Administered 2014-10-24: 0.2 mg via INTRAMUSCULAR

## 2014-10-24 MED ORDER — ONDANSETRON HCL 4 MG/2ML IJ SOLN: 4.0000 mg | Freq: Four times a day (QID) | INTRAMUSCULAR | Status: DC | PRN

## 2014-10-24 MED ORDER — PRENATAL MULTIVITAMIN CH
1.0000 | ORAL_TABLET | Freq: Every day | ORAL | Status: DC
  Administered 2014-10-26: 1 via ORAL
  Filled 2014-10-24: qty 1

## 2014-10-24 MED ORDER — ZOLPIDEM TARTRATE 5 MG PO TABS: 5.0000 mg | ORAL_TABLET | Freq: Every evening | ORAL | Status: DC | PRN

## 2014-10-24 MED ORDER — BENZOCAINE-MENTHOL 20-0.5 % EX AERO: 1.0000 "application " | INHALATION_SPRAY | CUTANEOUS | Status: DC | PRN

## 2014-10-24 MED ORDER — OXYCODONE-ACETAMINOPHEN 5-325 MG PO TABS
2.0000 | ORAL_TABLET | ORAL | Status: DC | PRN
  Administered 2014-10-25 (×2): 2 via ORAL
  Filled 2014-10-24 (×2): qty 2

## 2014-10-24 MED ORDER — MISOPROSTOL 200 MCG PO TABS
1000.0000 ug | ORAL_TABLET | Freq: Once | ORAL | Status: AC
Start: 2014-10-24 — End: 2014-10-24
  Administered 2014-10-24: 1000 ug via RECTAL

## 2014-10-24 MED ORDER — LACTATED RINGERS IV SOLN
INTRAVENOUS | Status: DC
Start: 2014-10-24 — End: 2014-10-24
  Administered 2014-10-24: 13:00:00 via INTRAVENOUS

## 2014-10-24 MED ORDER — METHYLERGONOVINE MALEATE 0.2 MG/ML IJ SOLN: 0.2000 mg | Freq: Once | INTRAMUSCULAR | Status: DC

## 2014-10-24 MED ORDER — ACETAMINOPHEN 325 MG PO TABS: 650.0000 mg | ORAL_TABLET | ORAL | Status: DC | PRN

## 2014-10-24 MED ORDER — OXYTOCIN BOLUS FROM INFUSION: 500.0000 mL | INTRAVENOUS | Status: DC

## 2014-10-24 MED ORDER — BUTORPHANOL TARTRATE 1 MG/ML IJ SOLN
INTRAMUSCULAR | Status: AC
  Filled 2014-10-24: qty 1

## 2014-10-24 MED ORDER — DIPHENHYDRAMINE HCL 25 MG PO CAPS: 25.0000 mg | ORAL_CAPSULE | Freq: Four times a day (QID) | ORAL | Status: DC | PRN

## 2014-10-24 MED ORDER — OXYCODONE-ACETAMINOPHEN 5-325 MG PO TABS
1.0000 | ORAL_TABLET | ORAL | Status: DC | PRN
  Administered 2014-10-24: 1 via ORAL
  Filled 2014-10-24: qty 1

## 2014-10-24 MED ORDER — SODIUM CHLORIDE 0.9 % IV SOLN
2.0000 g | Freq: Once | INTRAVENOUS | Status: AC
  Administered 2014-10-24: 2 g via INTRAVENOUS
  Filled 2014-10-24: qty 2000

## 2014-10-24 MED ORDER — OXYTOCIN 40 UNITS IN LACTATED RINGERS INFUSION - SIMPLE MED
62.5000 mL/h | INTRAVENOUS | Status: DC
  Administered 2014-10-24: 62.5 mL/h via INTRAVENOUS

## 2014-10-24 MED ORDER — WITCH HAZEL-GLYCERIN EX PADS: 1.0000 "application " | MEDICATED_PAD | CUTANEOUS | Status: DC | PRN

## 2014-10-24 MED ORDER — SENNOSIDES-DOCUSATE SODIUM 8.6-50 MG PO TABS
2.0000 | ORAL_TABLET | ORAL | Status: DC
Start: 2014-10-25 — End: 2014-10-26
  Administered 2014-10-24 – 2014-10-25 (×2): 2 via ORAL
  Filled 2014-10-24 (×2): qty 2

## 2014-10-24 MED ORDER — LANOLIN HYDROUS EX OINT: TOPICAL_OINTMENT | CUTANEOUS | Status: DC | PRN

## 2014-10-24 MED ORDER — METHYLERGONOVINE MALEATE 0.2 MG PO TABS
0.2000 mg | ORAL_TABLET | ORAL | Status: AC | PRN
  Administered 2014-10-24 – 2014-10-25 (×5): 0.2 mg via ORAL
  Filled 2014-10-24 (×5): qty 1

## 2014-10-24 MED ORDER — LACTATED RINGERS IV SOLN: 500.0000 mL | INTRAVENOUS | Status: DC | PRN

## 2014-10-24 MED ORDER — FERROUS SULFATE 325 (65 FE) MG PO TABS
325.0000 mg | ORAL_TABLET | Freq: Two times a day (BID) | ORAL | Status: DC
  Administered 2014-10-25 – 2014-10-26 (×3): 325 mg via ORAL
  Filled 2014-10-24 (×3): qty 1

## 2014-10-24 MED ORDER — SIMETHICONE 80 MG PO CHEW: 80.0000 mg | CHEWABLE_TABLET | ORAL | Status: DC | PRN

## 2014-10-24 MED ORDER — MISOPROSTOL 200 MCG PO TABS
ORAL_TABLET | ORAL | Status: AC
  Administered 2014-10-24: 1000 ug via RECTAL
  Filled 2014-10-24: qty 5

## 2014-10-24 MED ORDER — IBUPROFEN 600 MG PO TABS
600.0000 mg | ORAL_TABLET | Freq: Four times a day (QID) | ORAL | Status: DC
  Administered 2014-10-24 – 2014-10-26 (×6): 600 mg via ORAL
  Filled 2014-10-24 (×7): qty 1

## 2014-10-24 MED ORDER — DIBUCAINE 1 % RE OINT: 1.0000 "application " | TOPICAL_OINTMENT | RECTAL | Status: DC | PRN

## 2014-10-24 MED ORDER — FLUCONAZOLE 150 MG PO TABS: 150.0000 mg | ORAL_TABLET | Freq: Every day | ORAL | Status: DC

## 2014-10-24 MED ORDER — OXYCODONE-ACETAMINOPHEN 5-325 MG PO TABS: 2.0000 | ORAL_TABLET | ORAL | Status: DC | PRN

## 2014-10-24 MED ORDER — ONDANSETRON HCL 4 MG PO TABS: 4.0000 mg | ORAL_TABLET | ORAL | Status: DC | PRN

## 2014-10-24 MED ORDER — OXYTOCIN 10 UNIT/ML IJ SOLN
INTRAMUSCULAR | Status: AC
  Filled 2014-10-24: qty 1

## 2014-10-24 MED ORDER — LIDOCAINE HCL (PF) 1 % IJ SOLN
30.0000 mL | INTRAMUSCULAR | Status: DC | PRN
  Filled 2014-10-24: qty 30

## 2014-10-24 NOTE — Progress Notes (Signed)
1501 pt called out c/o needing RN quickly. Nevada Crane, RN responded to room and noted soccer sized clot. Pt noted to be alert, breastfeeding, VS stable, baby given to grandmother, fundus firm, even. Constant trickle of blood noted. Pt feels as if she could void, does not feel overly distended.

## 2014-10-24 NOTE — MAU Note (Signed)
Report called to [facility] RN on BS. Will go to 166

## 2014-10-24 NOTE — Progress Notes (Signed)
IV attempt, right wirst, left forearm without success. Site unremarkable.

## 2014-10-24 NOTE — Progress Notes (Signed)
Called to assess pt by [facility] Standard due to postpartum hemorrhage.  Pt with h/o pp hemorrahage requiring blood transfusion with last delivery.  Pt delivered atraumatically without lacerations.  Pt passed a large clot remote from delivery.  Upon arrival pt had received 1000 mcq Cytotec, Methergine IM.    Slow trickle noted at perineum.  No lacerations of perineum, labia, vaginal or cervix.  Uterus is firm, appropriately  tender, large clot extracted from LUS.  No active bleeding.  Observed and slow trickle noted, small clots reviewed.  Introitus dry.  Called back by RN in less than 1 hour due to gush with fundal massage.  Pt still comfortable, VSS.  Perineum dry, no bleeding from vagina.  Uterus firm.  Bedside ultrasound which showed EMS 1.5 cm, which I feel is acceptable postpartum.  No echogenic tissue seen c/w placenta.  A: Postpartum hemorrhage-possible bleeding disorder, no atony at this time.  P: Continue Methergine per protocol. If bleeding resumes, consider postpartum D&C.
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
## 2014-10-24 NOTE — Progress Notes (Addendum)
Arvil Persons Called to room by primary care nurse, pt bleeding and passing large clots   Vitals Temp:  [98.2 F (36.8 C)] 98.2 F (36.8 C) (01/03 1545) Pulse Rate:  [80-110] 94 (01/03 1605) Resp:  [18-20] 18 (01/03 1600) BP: (107-139)/(60-100) 125/77 mmHg (01/03 1600) SpO2:  [97 %-98 %] 97 % (01/03 1605) Weight:  [176 lb (79.833 kg)] 176 lb (79.833 kg) (01/03 1354)  Physical Exam:  General: alert and cooperative Active bleeding Bimanual massage, fundus firm Expressing clots   Recent Labs  10/24/14 1300 10/24/14 1514  HGB 11.8* 11.2*  HCT 35.0* 32.6*   Assessment EBL total 800 fundus firm Clots removed. 1000 mcg cytotec PR methergine IM left thigh Dr. Idamae Schuller called to the bedside    Plan: Continue current care con't monitor  con't PP care Methergine PO q4 x6    Di Jasmer, CNM, MSN 10/24/2014, 4:11 PM

## 2014-10-24 NOTE — Progress Notes (Signed)
1505 fundus firm, even, continues to trickle blood, Venus Standard, cnm called to room. Pt alert and talking.

## 2014-10-25 ENCOUNTER — Encounter (HOSPITAL_COMMUNITY): Payer: Self-pay | Admitting: *Deleted

## 2014-10-25 LAB — CBC
HCT: 30.6 % — ABNORMAL LOW (ref 36.0–46.0)
HEMATOCRIT: 30.3 % — AB (ref 36.0–46.0)
Hemoglobin: 10.5 g/dL — ABNORMAL LOW (ref 12.0–15.0)
Hemoglobin: 10.4 g/dL — ABNORMAL LOW (ref 12.0–15.0)
MCH: 31.4 pg (ref 26.0–34.0)
MCH: 31.6 pg (ref 26.0–34.0)
MCHC: 34.3 g/dL (ref 30.0–36.0)
MCHC: 34.3 g/dL (ref 30.0–36.0)
MCV: 91.6 fL (ref 78.0–100.0)
MCV: 92.1 fL (ref 78.0–100.0)
PLATELETS: 282 10*3/uL (ref 150–400)
PLATELETS: 289 10*3/uL (ref 150–400)
RBC: 3.34 MIL/uL — ABNORMAL LOW (ref 3.87–5.11)
RBC: 3.29 MIL/uL — AB (ref 3.87–5.11)
RDW: 12.9 % (ref 11.5–15.5)
RDW: 12.9 % (ref 11.5–15.5)
WBC: 18.6 10*3/uL — ABNORMAL HIGH (ref 4.0–10.5)
WBC: 16.9 10*3/uL — AB (ref 4.0–10.5)

## 2014-10-25 MED ORDER — RHO D IMMUNE GLOBULIN 1500 UNIT/2ML IJ SOSY
300.0000 ug | PREFILLED_SYRINGE | Freq: Once | INTRAMUSCULAR | Status: AC
  Administered 2014-10-25: 300 ug via INTRAMUSCULAR
  Filled 2014-10-25: qty 2

## 2014-10-25 NOTE — Progress Notes (Addendum)
Subjective: Postpartum Day 1: Vaginal delivery at 1349 , superficial right periurethral laceration, no repair required. Had dizziness on ambulating to bathroom this am for pericare, "felt heart racing".  Resolved after returning to bed.   Feeding:  Breast Contraceptive plan:  Undecided  Foley cath still in place  Had early pp hemorrhage--treated with Cytotech 1000 mcg and Methergine IM. Minimal bleeding since.  Had antenatal transfusion 08/27/14 for Hgb 8.4.  Objective: Vital signs in last 24 hours: Temp:  [97.9 F (36.6 C)-99 F (37.2 C)] 98.7 F (37.1 C) (01/04 0549) Pulse Rate:  [79-112] 83 (01/04 0549) Resp:  [18-20] 20 (01/04 0549) BP: (99-139)/(54-100) 99/54 mmHg (01/04 0549) SpO2:  [95 %-99 %] 98 % (01/04 0345) Weight:  [176 lb (79.833 kg)] 176 lb (79.833 kg) (01/03 1354)   Filed Vitals:   10/24/14 1930 10/24/14 2330 10/25/14 0345 10/25/14 0549  BP: 128/71 106/66 100/62 99/54  Pulse: 112 109 85 83  Temp: 97.9 F (36.6 C) 99 F (37.2 C) 98.7 F (37.1 C) 98.7 F (37.1 C)  TempSrc: Oral Oral Oral Oral  Resp: Height:      Weight:      SpO2: 98% 98% 98%     800 cc urine output overnight  Physical Exam:  General: fatigued, but alert Lochia: appropriate, no clots expressed Uterine Fundus: firm Perineum: healing well DVT Evaluation: No evidence of DVT seen on physical exam. Negative Homan's sign. Foley draining clear urine.  Baby Rh positive--patient will need Rhophylac   Recent Labs  10/24/14 1514 10/25/14 0604  HGB 11.2* 10.5*  HCT 32.6* 30.6*  Hgb 11.8 on admission  Assessment/Plan: Status post vaginal delivery day 1 S/P PP hemorrhage  Continue current care. Continue foley until ambulating without sx. CBC at 10 am for repeat evaluation. Orthostatic BP/pulses Fe BID. Reviewed indications for blood transfusion--if patient remains symptomatic, will offer transfusion. Close observation of status. Rhophylac prior to d/c    Nyra Capes 10/25/2014, 9:16 AM

## 2014-10-25 NOTE — Progress Notes (Signed)
In to f/u on patient status. Eating sandwich, drinking fluids. Up to BR this am, with removal of foley. Able to void spontaneously several times since. Still had mild dizziness on showering, but no syncope.  Filed Vitals:   10/25/14 1030 10/25/14 1040 10/25/14 1045 10/25/14 1306  BP: 108/68 106/60 100/52 102/62  Pulse: 88 92 100 88  Temp:      TempSrc:      Resp: Height:      Weight:      SpO2:       Orthostatics stable.   CBC Latest Ref Rng 10/25/2014 10/25/2014 10/24/2014  WBC 4.0 - 10.5 K/uL 16.9(H) 18.6(H) 15.1(H)  Hemoglobin 12.0 - 15.0 g/dL 10.4(L) 10.5(L) 11.2(L)  Hematocrit 36.0 - 46.0 % 30.3(L) 30.6(L) 32.6(L)  Platelets 150 - 400 K/uL 289 282 296   Encouraged patient to continue to push fluids and eat. Recheck CBC in am. No need for transfusion at present. Will continue to observe.  Nigel Bridgeman, CNM 10/25/14 5:15p

## 2014-10-25 NOTE — Progress Notes (Signed)
UR chart review completed.  

## 2014-10-25 NOTE — Lactation Note (Signed)
This note was copied from the chart of Rachel Oluwadarasimi Favor. Lactation Consultation Note Experienced BF mom for 3 yr. To her 2 yr. Old son. Denies any problems, states this baby is BF great w/o difficulty. States has colostrum and baby latching well. Mom encouraged to feed baby 8-12 times/24 hours and with feeding cues. Mom encouraged to waken baby for feeds.  Educated about newborn behavior. Reviewed Baby & Me book's Breastfeeding Basics. WH/LC brochure given w/resources, support groups and LC services. Patient Name: Rachel Sanders [id number] Date: 10/25/2014 Reason for consult: Initial assessment   Maternal Data Has patient been taught Hand Expression?: Yes Does the patient have breastfeeding experience prior to this delivery?: Yes  Feeding Feeding Type: Breast Fed Length of feed: 15 min  LATCH Score/Interventions Latch: Grasps breast easily, tongue down, lips flanged, rhythmical sucking.  Audible Swallowing: A few with stimulation Intervention(s): Skin to skin;Hand expression  Type of Nipple: Everted at rest and after stimulation  Comfort (Breast/Nipple): Soft / non-tender     Hold (Positioning): No assistance needed to correctly position infant at breast.  LATCH Score: 9  Lactation Tools Discussed/Used     Consult Status Consult Status: Follow-up Date: 10/26/13 Follow-up type: In-patient    Arynn Armand, Diamond Nickel 10/25/2014, 6:00 AM

## 2014-10-26 LAB — RH IG WORKUP (INCLUDES ABO/RH)
ABO/RH(D): A NEG
Fetal Screen: NEGATIVE
Gestational Age(Wks): 38.1
Unit division: 0

## 2014-10-26 LAB — CBC
HCT: 27.6 % — ABNORMAL LOW (ref 36.0–46.0)
Hemoglobin: 9.3 g/dL — ABNORMAL LOW (ref 12.0–15.0)
MCH: 31.4 pg (ref 26.0–34.0)
MCHC: 33.7 g/dL (ref 30.0–36.0)
MCV: 93.2 fL (ref 78.0–100.0)
Platelets: 284 10*3/uL (ref 150–400)
RBC: 2.96 MIL/uL — ABNORMAL LOW (ref 3.87–5.11)
RDW: 13.1 % (ref 11.5–15.5)
WBC: 13 10*3/uL — ABNORMAL HIGH (ref 4.0–10.5)

## 2014-10-26 MED ORDER — FERROUS SULFATE 325 (65 FE) MG PO TABS: 325.0000 mg | ORAL_TABLET | Freq: Every day | ORAL | Status: DC

## 2014-10-26 MED ORDER — OXYCODONE-ACETAMINOPHEN 5-325 MG PO TABS: 1.0000 | ORAL_TABLET | ORAL | Status: DC | PRN

## 2014-10-26 MED ORDER — IBUPROFEN 600 MG PO TABS: 600.0000 mg | ORAL_TABLET | Freq: Four times a day (QID) | ORAL | Status: DC | PRN

## 2014-10-26 NOTE — Discharge Instructions (Signed)

## 2014-10-26 NOTE — Lactation Note (Addendum)
This note was copied from the chart of Rachel Arvil PersonsBrianna Sanders. Lactation Consultation Note  Patient Name: Rachel Arvil PersonsBrianna Degrace JYNWG'NToday's Date: 10/26/2014 Reason for consult: Follow-up assessment  Per mom breast feeding is going well . LC reviewed sore nipple and engorgement prevention and tx  LC instructed on use hand pump , ( per mom has used manual pump with her 1st baby and #24 flange was a good fit)  LC referenced Baby and Me booklet pages 2024 -25  Mother informed of post-discharge support and given phone number to the lactation department, including services for phone call assistance; out-patient appointments; and breastfeeding support group. List of other breastfeeding resources in the community given in the handout. Encouraged mother to call for problems or concerns related to breastfeeding. Mother informed of post-discharge support and given phone number to the lactation department, including services for phone call assistance; out-patient appointments; and breastfeeding support group. List of other breastfeeding resources in the community given in the handout. Encouraged mother to call for problems or concerns related to breastfeeding.   Maternal Data    Feeding    LATCH Score/Interventions                      Lactation Tools Discussed/Used Tools: Pump Breast pump type: Manual Pump Review: Setup, frequency, and cleaning;Milk Storage Initiated by:: MAI  Date initiated:: 10/26/14   Consult Status Consult Status: Complete Date: 10/26/14 Follow-up type: In-patient    Kathrin Greathouseorio, Carollee Nussbaumer Ann 10/26/2014, 12:36 PM
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
## 2014-10-28 LAB — TYPE AND SCREEN
ABO/RH(D): A NEG
Antibody Screen: POSITIVE
DAT, IgG: NEGATIVE
UNIT DIVISION: 0
Unit division: 0
Unit division: 0
Unit division: 0

## 2014-12-15 ENCOUNTER — Encounter: Payer: Self-pay | Admitting: Neurology

## 2014-12-16 ENCOUNTER — Encounter: Payer: Self-pay | Admitting: Neurology

## 2014-12-16 ENCOUNTER — Ambulatory Visit: Payer: Medicaid Other | Admitting: Neurology

## 2014-12-17 ENCOUNTER — Encounter: Payer: Self-pay | Admitting: Neurology

## 2014-12-27 ENCOUNTER — Emergency Department (HOSPITAL_COMMUNITY)
Admission: EM | Admit: 2014-12-27 | Discharge: 2014-12-27 | Disposition: A | Discharge status: Home / Self Care | Payer: Medicaid Other | Attending: Emergency Medicine | Admitting: Emergency Medicine

## 2014-12-27 ENCOUNTER — Encounter (HOSPITAL_COMMUNITY): Payer: Self-pay | Admitting: Emergency Medicine

## 2014-12-27 ENCOUNTER — Emergency Department (HOSPITAL_COMMUNITY): Payer: Medicaid Other

## 2014-12-27 DIAGNOSIS — Z79899 Other long term (current) drug therapy: Secondary | ICD-10-CM | POA: Insufficient documentation

## 2014-12-27 DIAGNOSIS — S99922A Unspecified injury of left foot, initial encounter: Secondary | ICD-10-CM | POA: Diagnosis present

## 2014-12-27 DIAGNOSIS — S92515A Nondisplaced fracture of proximal phalanx of left lesser toe(s), initial encounter for closed fracture: Secondary | ICD-10-CM | POA: Diagnosis not present

## 2014-12-27 DIAGNOSIS — Y998 Other external cause status: Secondary | ICD-10-CM | POA: Diagnosis not present

## 2014-12-27 DIAGNOSIS — Z8619 Personal history of other infectious and parasitic diseases: Secondary | ICD-10-CM | POA: Diagnosis not present

## 2014-12-27 DIAGNOSIS — Y9389 Activity, other specified: Secondary | ICD-10-CM | POA: Insufficient documentation

## 2014-12-27 DIAGNOSIS — Y9289 Other specified places as the place of occurrence of the external cause: Secondary | ICD-10-CM | POA: Insufficient documentation

## 2014-12-27 DIAGNOSIS — S92502A Displaced unspecified fracture of left lesser toe(s), initial encounter for closed fracture: Secondary | ICD-10-CM

## 2014-12-27 DIAGNOSIS — T1490XA Injury, unspecified, initial encounter: Secondary | ICD-10-CM

## 2014-12-27 DIAGNOSIS — W108XXA Fall (on) (from) other stairs and steps, initial encounter: Secondary | ICD-10-CM | POA: Insufficient documentation

## 2014-12-27 DIAGNOSIS — D649 Anemia, unspecified: Secondary | ICD-10-CM | POA: Diagnosis not present

## 2014-12-27 MED ORDER — IBUPROFEN 800 MG PO TABS
800.0000 mg | ORAL_TABLET | Freq: Once | ORAL | Status: AC
  Administered 2014-12-27: 800 mg via ORAL

## 2014-12-27 MED ORDER — IBUPROFEN 800 MG PO TABS
800.0000 mg | ORAL_TABLET | Freq: Once | ORAL | Status: DC
  Filled 2014-12-27: qty 1

## 2014-12-27 MED ORDER — TRAMADOL HCL 50 MG PO TABS: 50.0000 mg | ORAL_TABLET | Freq: Four times a day (QID) | ORAL | Status: DC | PRN

## 2014-12-27 NOTE — ED Provider Notes (Signed)
CSN: [id number]     Arrival date & time 12/27/14  1401 History  This chart was scribed for non-physician practitioner Fayrene HelperBowie Corrie Reder, PA-C working with Doug SouSam Jacubowitz, MD by Littie Deedsichard Sun, ED Scribe. This patient was seen in room WTR6/WTR6 and the patient's care was started at 2:13 PM.      Chief Complaint  Patient presents with  . Foot Pain   The history is provided by the patient. No language interpreter was used.    HPI Comments: Rachel Sanders is a 24 y.o. female who presents to the Emergency Department complaining of sudden onset left foot pain secondary to an injury that occurred about an hour ago. Patient reports tripping and falling down 3-4 steps to the ground while quickly going down the stairs. She is able to ambulate, but with pain. Patient denies ankle pain, knee pain and numbness. She also denies head injury, LOC and possibility of pregnancy. NKDA.   Past Medical History  Diagnosis Date  . Pregnancy   . Anemia   . Blood transfusion affecting pregnancy   . PPH (postpartum hemorrhage)     last delivery and current delivery of 2016  . Chicken pox     as a child   Past Surgical History  Procedure Laterality Date  . No past surgeries     Family History  Problem Relation Age of Onset  . Asthma Father   . Asthma Sister    History  Substance Use Topics  . Smoking status: Never Smoker   . Smokeless tobacco: Never Used  . Alcohol Use: No   OB History    Gravida Para Term Preterm AB TAB SAB Ectopic Multiple Living   2 2 2  0 0 0 0 0 0 2     Review of Systems  Musculoskeletal: Negative for arthralgias.  Neurological: Negative for numbness.      Allergies  Review of patient's allergies indicates no known allergies.  Home Medications   Prior to Admission medications   Medication Sig Start Date End Date Taking? Authorizing Provider  acetaminophen (TYLENOL) 500 MG tablet Take 1,000 mg by mouth every 6 (six) hours as needed for mild pain or headache.    Historical Provider,  MD  Albuterol Sulfate 108 (90 BASE) MCG/ACT AEPB Inhale into the lungs. Inhale 2 puffs every 4 hours by inhalation route    Historical Provider, MD  ferrous sulfate 325 (65 FE) MG tablet Take 1 tablet (325 mg total) by mouth daily with breakfast. 10/26/14   Nigel BridgemanVicki Latham, CNM  ibuprofen (ADVIL,MOTRIN) 600 MG tablet Take 1 tablet (600 mg total) by mouth every 6 (six) hours as needed. 10/26/14   Nigel BridgemanVicki Latham, CNM  mupirocin cream (BACTROBAN) 2 % Apply 1 application topically 2 (two) times daily. Newman's Nipple Cream-    Historical Provider, MD  oxyCODONE-acetaminophen (PERCOCET/ROXICET) 5-325 MG per tablet Take 1 tablet by mouth every 4 (four) hours as needed (for pain scale less than 7). 10/26/14   Nigel BridgemanVicki Latham, CNM  Prenatal Vit-Fe Fumarate-FA (PRENATAL MULTIVITAMIN) TABS tablet Take 1 tablet by mouth daily at 12 noon. 08/28/14   Venus Standard, CNM  Prenatal Vit-Iron Carbonyl-FA (OB COMPLETE PO) Take 40 mg by mouth. Take 1 capsule every day at 12 noon,40mg - 10mg ,1mg -300mg     Historical Provider, MD  traMADol (ULTRAM) 50 MG tablet Take by mouth every 6 (six) hours as needed.    Historical Provider, MD  UNABLE TO FIND Med Name:     Historical Provider, MD   BP 126/77 mmHg  Pulse 106  Temp(Src) 98.2 F (36.8 C) (Oral)  Resp 16  SpO2 97% Physical Exam  Constitutional: She is oriented to person, place, and time. She appears well-developed and well-nourished. No distress.  HENT:  Head: Normocephalic and atraumatic.  Mouth/Throat: Oropharynx is clear and moist. No oropharyngeal exudate.  Eyes: Pupils are equal, round, and reactive to light.  Neck: Neck supple.  Cardiovascular: Normal rate.   Pulmonary/Chest: Effort normal.  Musculoskeletal: She exhibits tenderness. She exhibits no edema.  Left ankle with full ROM and no TTP. Tenderness noted to lateral aspects of foot at 5th MTP and 5th metatarsal region, no bruising or swelling. No foreign objects. Brisk cap refill throughout toes. Pedal pulse  palpable. She was able to ambulate.   Neurological: She is alert and oriented to person, place, and time. No cranial nerve deficit.  Skin: Skin is warm and dry. No rash noted.  Psychiatric: She has a normal mood and affect. Her behavior is normal.  Nursing note and vitals reviewed.   ED Course  Procedures  DIAGNOSTIC STUDIES: Oxygen Saturation is 97% on room air, normal by my interpretation.    COORDINATION OF CARE: 2:16 PM-Discussed treatment plan which includes ibuprofen and XR imaging with pt at bedside and pt agreed to plan.   3:20 PM Xray demonstrates nondisplaced fx of the distal lateral corner of the proximal phalanx of the small toe.  Post op shoe provide for support, pain medication prescribed.  Definitely treatment of broken toe implemented.  Return precaution discussed. Pt is NVI and can ambulate. Pt is breast feeding.  She is made aware to avoid taking narcotic pain medication unless pain is not well controlled.  Instruct to pump and dump milk when taking narcotic pain meds.  Pt voice understanding and agrees with plan.    Labs Review Labs Reviewed - No data to display  Imaging Review Dg Foot Complete Left  12/27/2014   CLINICAL DATA:  Larey Seat down stairs today at home.  Lateral pain.  EXAM: LEFT FOOT - COMPLETE 3+ VIEW  COMPARISON:  12/13/2006  FINDINGS: There is a nondisplaced fracture of the distal lateral corner of the proximal phalanx of the small toe. No other traumatic finding.  IMPRESSION: Nondisplaced fracture of the distal lateral corner of the proximal phalanx of the small toe.   Electronically Signed   By: Paulina Fusi M.D.   On: 12/27/2014 15:09     EKG Interpretation None      MDM   Final diagnoses:  Injury  Closed fracture of fifth toe of left foot, initial encounter    BP 126/77 mmHg  Pulse 106  Temp(Src) 98.2 F (36.8 C) (Oral)  Resp 16  SpO2 97%  I have reviewed nursing notes and vital signs. I personally reviewed the imaging tests through  PACS system  I reviewed available ER/hospitalization records thought the EMR   I personally performed the services described in this documentation, which was scribed in my presence. The recorded information has been reviewed and is accurate.     Fayrene Helper, PA-C 12/27/14 1522  Fayrene Helper, PA-C 12/27/14 1524  Doug Sou, MD 12/27/14 [phone]

## 2014-12-27 NOTE — ED Notes (Addendum)
Pt c/o L foot pain after tripping down three to four steps. Pt denies completely falling or hitting head. Denies any other injury. Pt c/o swelling to toes, no obvious deformity noted. Pt ambulatory. A&Ox4.

## 2014-12-27 NOTE — Discharge Instructions (Signed)
You broke the base of your 5th toe.  Wear post op shoe for support.  Follow instruction below.   Toe Fracture  with Rehab A fracture is a break in the bone that can be either partial or complete. Fractures of the toe bones may or may not include the joints that separate the bones. SYMPTOMS   Severe pain over the fracture site at the time of injury that may persist for an extend period of time.  Pain, tenderness, inflammation, and/or bruising (contusion) over the fracture site.  Visible deformity, if the bone fragments are not properly aligned (displaced fracture).  Signs of vascular damage: numbness or coldness (uncommon). CAUSES  Toe fractures occur when a force is placed on the bone that is greater than it can withstand.  Direct hit (trauma) to the toe.  Indirect trauma to the toe, such as forcefully pivoting on a planted foot. RISK INCREASES WITH:  Performing activities barefoot (i.e. ballet, gymnastics).  Wearing shoes with little support or protection.  Sports with cleats (i.e. football, rugby, lacrosse, soccer).  Bone disease (i.e. osteoporosis, bone tumors). PREVENTION   Wear properly fitted and protective shoes.  Protect previously injured toes with tape or padding. PROGNOSIS  If treated properly, toe fractures usually heal within 4 to 6 weeks. RELATED COMPLICATIONS   Failure of the fracture to heal (nonunion).  Healing of the fracture in a poor position (malunion).  Recurring symptoms.  Recurring symptoms that result in a chronic problem.  Excessive bleeding, causing pressure on nerves and blood vessels (rare).  Arthritis of the affected joints.  Stopping of bone growth in children.  Infection in fractures where the skin is broken over the fracture (open fracture).  Shortening of injured bones. TREATMENT  Treatment first involves the use of ice and medicine to reduce pain and inflammation. The toe should be restrained for a period of time to allow for  healing, usually about 4 weeks. Your caregiver may advise wearing a hard-soled shoe to minimize stress on the healing bone. Surgery is uncommon for this injury, but may be necessary if the fracture is severely displaced or if the bone pushes through the skin. Surgery typically involves the use of screws, pins, and/or plates to hold the fracture in place. After surgery, restraint of the foot is necessary. MEDICATION   If pain medicine is necessary, nonsteroidal anti-inflammatory medications (aspirin and ibuprofen), or other minor pain relievers (acetaminophen), are often recommended.  Do not take pain medicine for 7 days before surgery.  Prescription pain relievers may be given if your caregiver thinks they are needed. Use only as directed and only as much as you need. COLD THERAPY  Cold treatment (icing) relieves pain and reduces inflammation. Cold treatment should be applied for 10 to 15 minutes every 2 to 3 hours, and immediately after activity that aggravates your symptoms. Use ice packs or an ice massage. SEEK MEDICAL CARE IF:   Treatment does not seem to help, or the condition gets worse.  Any medicines produce negative side effects.  Any complications from surgery occur:  Pain, numbness, or coldness in the affected foot.  Discoloration beneath the toenails (blue or gray) of the affected foot.  Signs of infection (fever, pain, inflammation, redness, or persistent bleeding). EXERCISES RANGE OF MOTION (ROM) AND STRETCHING EXERCISES - Toe Fracture (Phalangeal) These exercises may help you when beginning to rehabilitate your injury. Your symptoms may resolve with or without further involvement from your physician, physical therapist or athletic trainer. While completing these exercises,  remember:   Restoring tissue flexibility helps normal motion to return to the joints. This allows healthier, less painful movement and activity.  An effective stretch should be held for at least 30  seconds.  A stretch should never be painful. You should only feel a gentle lengthening or release in the stretched tissue. RANGE OF MOTION - Dorsi/Plantar Flexion  While sitting with your right / left knee straight, draw the top of your foot upwards by flexing your ankle. Then reverse the motion, pointing your toes downward.  Hold each position for __________ seconds.  After completing your first set of exercises, repeat this exercise with your knee bent. Repeat __________ times. Complete this exercise __________ times per day.  RANGE OF MOTION - Ankle Alphabet Imagine your right / left big toe is a pen. Keeping your hip and knee still, write out the entire alphabet with your "pen." Make the letters as large as you can without increasing any discomfort. Repeat __________ times. Complete this exercise __________ times per day.  RANGE OF MOTION - Toe Extension, Flexion  Sit with your right / left leg crossed over your opposite knee.  Grasp your toes and gently pull them back toward the top of your foot. You should feel a stretch on the bottom of your toes and foot.  Hold this stretch for __________ seconds.  Now, gently pull your toes toward the bottom of your foot. You should feel a stretch on the top of your toes and foot.  Hold this stretch for __________ seconds. Repeat __________ times. Complete this stretch__________ times per day.  STRENGTHENING EXERCISES - Toe Fracture (Phalangeal) These exercises may help you when beginning to rehabilitate your injury. They may resolve your symptoms with or without further involvement from your physician, physical therapist or athletic trainer. While completing these exercises, remember:   Muscles can gain both the endurance and the strength needed for everyday activities through controlled exercises.  Complete these exercises as instructed by your physician, physical therapist or athletic trainer. Increase the resistance and repetitions only  as guided.  You may experience muscle soreness or fatigue, but the pain or discomfort you are trying to eliminate should never worsen during these exercises. If this pain does get worse, stop and make sure you are following the directions exactly. If the pain is still present after adjustments, discontinue the exercise until you can discuss the trouble with your clinician. STRENGTH - Towel Curls  Sit in a chair, on a non-carpeted surface.  Place your foot on a towel, keeping your heel on the floor.  Pull the towel toward your heel only by curling your toes. Keep your heel on the floor.  If instructed by your physician, physical therapist or athletic trainer, add ____________________ at the end of the towel. Repeat __________ times. Complete this exercise __________ times per day. Document Released: 10/08/2005 Document Revised: 12/31/2011 Document Reviewed: 01/20/2009 [facility] Patient Information 2015 [place], [place]. This information is not intended to replace advice given to you by your health care provider. Make sure you discuss any questions you have with your health care provider.

## 2014-12-30 ENCOUNTER — Telehealth: Payer: Self-pay

## 2014-12-30 ENCOUNTER — Ambulatory Visit: Payer: Medicaid Other | Admitting: Neurology

## 2014-12-30 NOTE — Telephone Encounter (Signed)
Patient No Showed appt today 12/30/14.

## 2015-01-11 ENCOUNTER — Encounter: Payer: Self-pay | Admitting: Neurology

## 2015-06-01 ENCOUNTER — Encounter (HOSPITAL_COMMUNITY): Payer: Self-pay | Admitting: *Deleted

## 2015-06-01 DIAGNOSIS — D649 Anemia, unspecified: Secondary | ICD-10-CM | POA: Insufficient documentation

## 2015-06-01 DIAGNOSIS — R110 Nausea: Secondary | ICD-10-CM | POA: Insufficient documentation

## 2015-06-01 DIAGNOSIS — G43909 Migraine, unspecified, not intractable, without status migrainosus: Secondary | ICD-10-CM | POA: Insufficient documentation

## 2015-06-01 DIAGNOSIS — Z79899 Other long term (current) drug therapy: Secondary | ICD-10-CM | POA: Insufficient documentation

## 2015-06-01 DIAGNOSIS — R42 Dizziness and giddiness: Secondary | ICD-10-CM | POA: Insufficient documentation

## 2015-06-01 DIAGNOSIS — Z8619 Personal history of other infectious and parasitic diseases: Secondary | ICD-10-CM | POA: Insufficient documentation

## 2015-06-01 NOTE — ED Notes (Signed)
Pt c/o migraine x 3 weeks. States she has been prescribed hydrocodone, Toradol and ibuforen with no relief.

## 2015-06-02 ENCOUNTER — Emergency Department (HOSPITAL_COMMUNITY)
Admission: EM | Admit: 2015-06-02 | Discharge: 2015-06-02 | Disposition: A | Discharge status: Home / Self Care | Payer: Medicaid Other | Attending: Emergency Medicine | Admitting: Emergency Medicine

## 2015-06-02 DIAGNOSIS — R51 Headache: Secondary | ICD-10-CM

## 2015-06-02 DIAGNOSIS — R519 Headache, unspecified: Secondary | ICD-10-CM

## 2015-06-02 MED ORDER — METOCLOPRAMIDE HCL 5 MG/ML IJ SOLN
10.0000 mg | Freq: Once | INTRAMUSCULAR | Status: AC
  Administered 2015-06-02: 10 mg via INTRAVENOUS
  Filled 2015-06-02: qty 2

## 2015-06-02 MED ORDER — OXYCODONE-ACETAMINOPHEN 5-325 MG PO TABS: 1.0000 | ORAL_TABLET | ORAL | Status: DC | PRN

## 2015-06-02 MED ORDER — IBUPROFEN 600 MG PO TABS: 600.0000 mg | ORAL_TABLET | Freq: Four times a day (QID) | ORAL | Status: DC | PRN

## 2015-06-02 MED ORDER — METOCLOPRAMIDE HCL 10 MG PO TABS: 10.0000 mg | ORAL_TABLET | Freq: Four times a day (QID) | ORAL | Status: DC

## 2015-06-02 MED ORDER — SODIUM CHLORIDE 0.9 % IV BOLUS (SEPSIS)
1000.0000 mL | Freq: Once | INTRAVENOUS | Status: AC
  Administered 2015-06-02: 1000 mL via INTRAVENOUS

## 2015-06-02 MED ORDER — KETOROLAC TROMETHAMINE 30 MG/ML IJ SOLN
30.0000 mg | Freq: Once | INTRAMUSCULAR | Status: AC
  Administered 2015-06-02: 30 mg via INTRAVENOUS
  Filled 2015-06-02: qty 1

## 2015-06-02 MED ORDER — DIPHENHYDRAMINE HCL 50 MG/ML IJ SOLN
25.0000 mg | Freq: Once | INTRAMUSCULAR | Status: AC
  Administered 2015-06-02: 25 mg via INTRAVENOUS
  Filled 2015-06-02: qty 1

## 2015-06-02 MED ORDER — DEXAMETHASONE SODIUM PHOSPHATE 10 MG/ML IJ SOLN
10.0000 mg | Freq: Once | INTRAMUSCULAR | Status: AC
  Administered 2015-06-02: 10 mg via INTRAVENOUS
  Filled 2015-06-02: qty 1

## 2015-06-02 NOTE — ED Provider Notes (Signed)
CSN: [id number]     Arrival date & time 06/01/15  2123 History  This chart was scribed for Derwood Kaplan, MD by Phillis Haggis, ED Scribe. This patient was seen in room A07C/A07C and patient care was started at 1:31 AM.   Chief Complaint  Patient presents with  . Migraine   The history is provided by the patient. No language interpreter was used.  HPI Comments: Rachel Sanders is a 24 y.o. female who presents to the Emergency Department complaining of constant throbbing, sharp, frontal, severe headache onset 3 weeks ago. No nausea, vomiting, visual complains, seizures, altered mental status, loss of consciousness, new weakness, or numbness, no gait instability. She currently rates pain 9/10. She denies worsening or alleviating factors. She states that she has been taking occasional Toradol, hydrocodone, and 600 mg ibuprofen to no relief. She denies resting vomiting, dizziness, blurred vision, or photophobia. She denies headaches like this before. She denies family hx of brain aneurysms, but states that her sister has a hx of migraines. Pt also denies any brain bleeds in the family or any blood related disorders in the family. She denies any chance of pregnancy. She is not on oral contraceptives.   Past Medical History  Diagnosis Date  . Pregnancy   . Anemia   . Blood transfusion affecting pregnancy   . PPH (postpartum hemorrhage)     last delivery and current delivery of 2016  . Chicken pox     as a child   Past Surgical History  Procedure Laterality Date  . No past surgeries     Family History  Problem Relation Age of Onset  . Asthma Father   . Asthma Sister    Social History  Substance Use Topics  . Smoking status: Never Smoker   . Smokeless tobacco: Never Used  . Alcohol Use: No   OB History    Gravida Para Term Preterm AB TAB SAB Ectopic Multiple Living   2 2 2  0 0 0 0 0 0 2     Review of Systems  Eyes: Negative for photophobia and visual disturbance.  Gastrointestinal:  Positive for nausea. Negative for vomiting.  Neurological: Positive for light-headedness and headaches. Negative for dizziness.  All other systems reviewed and are negative.  Allergies  Review of patient's allergies indicates no known allergies.  Home Medications   Prior to Admission medications   Medication Sig Start Date End Date Taking? Authorizing Provider  acetaminophen (TYLENOL) 500 MG tablet Take 1,000 mg by mouth every 6 (six) hours as needed for mild pain or headache.   Yes Historical Provider, MD  Albuterol Sulfate 108 (90 BASE) MCG/ACT AEPB Inhale into the lungs. Inhale 2 puffs every 4 hours by inhalation route   Yes Historical Provider, MD  ferrous sulfate 325 (65 FE) MG tablet Take 1 tablet (325 mg total) by mouth daily with breakfast. 10/26/14  Yes Nigel Bridgeman, CNM  ketorolac (TORADOL) 10 MG tablet Take 10 mg by mouth every 6 (six) hours as needed for moderate pain.   Yes Historical Provider, MD  ibuprofen (ADVIL,MOTRIN) 600 MG tablet Take 1 tablet (600 mg total) by mouth every 6 (six) hours as needed. 06/02/15   Derwood Kaplan, MD  metoCLOPramide (REGLAN) 10 MG tablet Take 1 tablet (10 mg total) by mouth every 6 (six) hours. 06/02/15   Derwood Kaplan, MD  oxyCODONE-acetaminophen (PERCOCET/ROXICET) 5-325 MG per tablet Take 1 tablet by mouth every 4 (four) hours as needed for severe pain. 06/02/15   Novah Goza,  MD   BP 123/71 mmHg  Pulse 74  Temp(Src) 98.3 F (36.8 C) (Oral)  Resp 20  SpO2 100%  Physical Exam  Constitutional: She is oriented to person, place, and time. She appears well-developed and well-nourished.  HENT:  Head: Normocephalic and atraumatic.  Eyes: Conjunctivae are normal. Right eye exhibits no discharge. Left eye exhibits no discharge.  Pupils 3mm, equal and reactive to light  Neck: Normal range of motion.  No nuchal rigidity  Cardiovascular: Normal rate, regular rhythm and normal heart sounds.   No murmur heard. Pulmonary/Chest: Effort normal and  breath sounds normal. No respiratory distress.  Neurological: She is alert and oriented to person, place, and time. No cranial nerve deficit. Coordination normal.  Cranial nerves II-XII intact; sensory exam for upper extremities normal; 4+ and equal strength bilaterally  Skin: Skin is warm and dry. No rash noted. She is not diaphoretic. No erythema.  Psychiatric: She has a normal mood and affect.  Nursing note and vitals reviewed.   ED Course  Procedures (including critical care time) DIAGNOSTIC STUDIES: Oxygen Saturation is 100% on RA, normal by my interpretation.    COORDINATION OF CARE: 1:37 AM-Discussed treatment plan which includes IV fluids and follow up to neurologist with pt at bedside and pt agreed to plan.   3:07 AM-pt states that her pain is improving post medication. No new neurologic complains.  Labs Review Labs Reviewed - No data to display  Imaging Review No results found.   EKG Interpretation None      MDM   Final diagnoses:  Frontal headache    I personally performed the services described in this documentation, which was scribed in my presence. The recorded information has been reviewed and is accurate.  Pt comes in with cc of headaches The headaches have been present for several days now, and there is no focal neuro complains or deficits. Pt's VSS and WNL. No nuchal rigidity, no neck bruits, normal neuro exam. Pt denies drug use. Not on oral contraceptives and not pregnant. DDX: complex migraines. Although, vascular condition such has dural or venos thrombosis are also possible. Will give some meds here. I dont think pt has SAH - but i thik she will need to see a Neurologist for this pain for optimal outpatient workup.   Derwood Kaplan, MD 06/05/15 2244

## 2015-06-02 NOTE — Discharge Instructions (Signed)
We saw you in the ER for headaches. All the labs and imaging are normal. We are not sure what is causing your headaches, however, there appears to be no evidence of infection, bleeds or tumors based on our exam and results.  Please take motrin round the clock for the next 6 hours, and take other meds prescribed only for break through pain. See Neurologist as requested.   Headaches, Frequently Asked Questions MIGRAINE HEADACHES Q: What is migraine? What causes it? How can I treat it? A: Generally, migraine headaches begin as a dull ache. Then they develop into a constant, throbbing, and pulsating pain. You may experience pain at the temples. You may experience pain at the front or back of one or both sides of the head. The pain is usually accompanied by a combination of:  Nausea.  Vomiting.  Sensitivity to light and noise. Some people (about 15%) experience an aura (see below) before an attack. The cause of migraine is believed to be chemical reactions in the brain. Treatment for migraine may include over-the-counter or prescription medications. It may also include self-help techniques. These include relaxation training and biofeedback.  Q: What is an aura? A: About 15% of people with migraine get an "aura". This is a sign of neurological symptoms that occur before a migraine headache. You may see wavy or jagged lines, dots, or flashing lights. You might experience tunnel vision or blind spots in one or both eyes. The aura can include visual or auditory hallucinations (something imagined). It may include disruptions in smell (such as strange odors), taste or touch. Other symptoms include:  Numbness.  A "pins and needles" sensation.  Difficulty in recalling or speaking the correct word. These neurological events may last as long as 60 minutes. These symptoms will fade as the headache begins. Q: What is a trigger? A: Certain physical or environmental factors can lead to or "trigger" a  migraine. These include:  Foods.  Hormonal changes.  Weather.  Stress. It is important to remember that triggers are different for everyone. To help prevent migraine attacks, you need to figure out which triggers affect you. Keep a headache diary. This is a good way to track triggers. The diary will help you talk to your healthcare professional about your condition. Q: Does weather affect migraines? A: Bright sunshine, hot, humid conditions, and drastic changes in barometric pressure may lead to, or "trigger," a migraine attack in some people. But studies have shown that weather does not act as a trigger for everyone with migraines. Q: What is the link between migraine and hormones? A: Hormones start and regulate many of your body's functions. Hormones keep your body in balance within a constantly changing environment. The levels of hormones in your body are unbalanced at times. Examples are during menstruation, pregnancy, or menopause. That can lead to a migraine attack. In fact, about three quarters of all women with migraine report that their attacks are related to the menstrual cycle.  Q: Is there an increased risk of stroke for migraine sufferers? A: The likelihood of a migraine attack causing a stroke is very remote. That is not to say that migraine sufferers cannot have a stroke associated with their migraines. In persons under age 72, the most common associated factor for stroke is migraine headache. But over the course of a person's normal life span, the occurrence of migraine headache may actually be associated with a reduced risk of dying from cerebrovascular disease due to stroke.  Q: What are  acute medications for migraine? A: Acute medications are used to treat the pain of the headache after it has started. Examples over-the-counter medications, NSAIDs, ergots, and triptans.  Q: What are the triptans? A: Triptans are the newest class of abortive medications. They are specifically  targeted to treat migraine. Triptans are vasoconstrictors. They moderate some chemical reactions in the brain. The triptans work on receptors in your brain. Triptans help to restore the balance of a neurotransmitter called serotonin. Fluctuations in levels of serotonin are thought to be a main cause of migraine.  Q: Are over-the-counter medications for migraine effective? A: Over-the-counter, or "OTC," medications may be effective in relieving mild to moderate pain and associated symptoms of migraine. But you should see your caregiver before beginning any treatment regimen for migraine.  Q: What are preventive medications for migraine? A: Preventive medications for migraine are sometimes referred to as "prophylactic" treatments. They are used to reduce the frequency, severity, and length of migraine attacks. Examples of preventive medications include antiepileptic medications, antidepressants, beta-blockers, calcium channel blockers, and NSAIDs (nonsteroidal anti-inflammatory drugs). Q: Why are anticonvulsants used to treat migraine? A: During the past few years, there has been an increased interest in antiepileptic drugs for the prevention of migraine. They are sometimes referred to as "anticonvulsants". Both epilepsy and migraine may be caused by similar reactions in the brain.  Q: Why are antidepressants used to treat migraine? A: Antidepressants are typically used to treat people with depression. They may reduce migraine frequency by regulating chemical levels, such as serotonin, in the brain.  Q: What alternative therapies are used to treat migraine? A: The term "alternative therapies" is often used to describe treatments considered outside the scope of conventional Western medicine. Examples of alternative therapy include acupuncture, acupressure, and yoga. Another common alternative treatment is herbal therapy. Some herbs are believed to relieve headache pain. Always discuss alternative therapies  with your caregiver before proceeding. Some herbal products contain arsenic and other toxins. TENSION HEADACHES Q: What is a tension-type headache? What causes it? How can I treat it? A: Tension-type headaches occur randomly. They are often the result of temporary stress, anxiety, fatigue, or anger. Symptoms include soreness in your temples, a tightening band-like sensation around your head (a "vice-like" ache). Symptoms can also include a pulling feeling, pressure sensations, and contracting head and neck muscles. The headache begins in your forehead, temples, or the back of your head and neck. Treatment for tension-type headache may include over-the-counter or prescription medications. Treatment may also include self-help techniques such as relaxation training and biofeedback. CLUSTER HEADACHES Q: What is a cluster headache? What causes it? How can I treat it? A: Cluster headache gets its name because the attacks come in groups. The pain arrives with little, if any, warning. It is usually on one side of the head. A tearing or bloodshot eye and a runny nose on the same side of the headache may also accompany the pain. Cluster headaches are believed to be caused by chemical reactions in the brain. They have been described as the most severe and intense of any headache type. Treatment for cluster headache includes prescription medication and oxygen. SINUS HEADACHES Q: What is a sinus headache? What causes it? How can I treat it? A: When a cavity in the bones of the face and skull (a sinus) becomes inflamed, the inflammation will cause localized pain. This condition is usually the result of an allergic reaction, a tumor, or an infection. If your headache is caused by a  sinus blockage, such as an infection, you will probably have a fever. An x-ray will confirm a sinus blockage. Your caregiver's treatment might include antibiotics for the infection, as well as antihistamines or decongestants.  REBOUND  HEADACHES Q: What is a rebound headache? What causes it? How can I treat it? A: A pattern of taking acute headache medications too often can lead to a condition known as "rebound headache." A pattern of taking too much headache medication includes taking it more than 2 days per week or in excessive amounts. That means more than the label or a caregiver advises. With rebound headaches, your medications not only stop relieving pain, they actually begin to cause headaches. Doctors treat rebound headache by tapering the medication that is being overused. Sometimes your caregiver will gradually substitute a different type of treatment or medication. Stopping may be a challenge. Regularly overusing a medication increases the potential for serious side effects. Consult a caregiver if you regularly use headache medications more than 2 days per week or more than the label advises. ADDITIONAL QUESTIONS AND ANSWERS Q: What is biofeedback? A: Biofeedback is a self-help treatment. Biofeedback uses special equipment to monitor your body's involuntary physical responses. Biofeedback monitors:  Breathing.  Pulse.  Heart rate.  Temperature.  Muscle tension.  Brain activity. Biofeedback helps you refine and perfect your relaxation exercises. You learn to control the physical responses that are related to stress. Once the technique has been mastered, you do not need the equipment any more. Q: Are headaches hereditary? A: Four out of five (80%) of people that suffer report a family history of migraine. Scientists are not sure if this is genetic or a family predisposition. Despite the uncertainty, a child has a 50% chance of having migraine if one parent suffers. The child has a 75% chance if both parents suffer.  Q: Can children get headaches? A: By the time they reach high school, most young people have experienced some type of headache. Many safe and effective approaches or medications can prevent a headache  from occurring or stop it after it has begun.  Q: What type of doctor should I see to diagnose and treat my headache? A: Start with your primary caregiver. Discuss his or her experience and approach to headaches. Discuss methods of classification, diagnosis, and treatment. Your caregiver may decide to recommend you to a headache specialist, depending upon your symptoms or other physical conditions. Having diabetes, allergies, etc., may require a more comprehensive and inclusive approach to your headache. The National Head[employer] Foundation will provide, upon request, a list of [facility] physician members in your state. Document Released: 12/29/2003 Document Revised: 12/31/2011 Document Reviewed: 06/07/2008 [facility] Patient Information 2015 [place], [place]. This information is not intended to replace advice given to you by your health care provider. Make sure you discuss any questions you have with your health care provider.

## 2015-06-02 NOTE — ED Notes (Signed)
Dr. Nanavanti at the bedside.  

## 2015-06-02 NOTE — ED Notes (Signed)
Dr. Shyrl Numbers in to see the patient.

## 2015-07-07 ENCOUNTER — Encounter (HOSPITAL_COMMUNITY): Payer: Self-pay | Admitting: Emergency Medicine

## 2015-07-07 ENCOUNTER — Emergency Department (HOSPITAL_COMMUNITY)
Admission: EM | Admit: 2015-07-07 | Discharge: 2015-07-08 | Disposition: A | Discharge status: Home / Self Care | Payer: Medicaid Other | Attending: Emergency Medicine | Admitting: Emergency Medicine

## 2015-07-07 DIAGNOSIS — Z8619 Personal history of other infectious and parasitic diseases: Secondary | ICD-10-CM | POA: Insufficient documentation

## 2015-07-07 DIAGNOSIS — R109 Unspecified abdominal pain: Secondary | ICD-10-CM

## 2015-07-07 DIAGNOSIS — R42 Dizziness and giddiness: Secondary | ICD-10-CM | POA: Insufficient documentation

## 2015-07-07 DIAGNOSIS — R51 Headache: Secondary | ICD-10-CM | POA: Insufficient documentation

## 2015-07-07 DIAGNOSIS — R1033 Periumbilical pain: Secondary | ICD-10-CM | POA: Insufficient documentation

## 2015-07-07 DIAGNOSIS — Z79899 Other long term (current) drug therapy: Secondary | ICD-10-CM | POA: Insufficient documentation

## 2015-07-07 DIAGNOSIS — D649 Anemia, unspecified: Secondary | ICD-10-CM | POA: Insufficient documentation

## 2015-07-07 DIAGNOSIS — R519 Headache, unspecified: Secondary | ICD-10-CM

## 2015-07-07 DIAGNOSIS — J029 Acute pharyngitis, unspecified: Secondary | ICD-10-CM | POA: Insufficient documentation

## 2015-07-07 LAB — POC URINE PREG, ED: Preg Test, Ur: NEGATIVE

## 2015-07-07 NOTE — ED Notes (Signed)
Pt c/o sore throat, chronic HA, sensitivity to light, upper mid abdominal pain only with palpation. Denies N/V/D. No relief with home meds.

## 2015-07-07 NOTE — ED Provider Notes (Signed)
CSN: [id number]     Arrival date & time 07/07/15  2039 History   First MD Initiated Contact with Patient 07/07/15 2306     Chief Complaint  Patient presents with  . Sore Throat  . Migraine  . Abdominal Pain    HPI   Rachel Sanders is a 24 y.o. female with a PMH of anemia who presents to the ED with headache, sore throat, abdominal pain. She reports she has had a headache located over her forehead for the past month. She states her headache is intermittent and reports photophobia. She has tried percocet for symptom relief, which has not been effective. She states she took excedrin this morning, with no symptom relief. She denies fever, chills, lightheadedness, syncope, vision changes. She reports dizziness when she stands up from sitting. She reports abdominal pain and sore throat for the past 3-4 days. She states her symptoms are intermittent. She is unable to identify exacerbating factors. She has not tried anything for symptom relief. She denies nausea, vomiting, diarrhea, constipation. She denies cough or congestion. She denies recent illness or sick contact.   Past Medical History  Diagnosis Date  . Pregnancy   . Anemia   . Blood transfusion affecting pregnancy   . PPH (postpartum hemorrhage)     last delivery and current delivery of 2016  . Chicken pox     as a child   Past Surgical History  Procedure Laterality Date  . No past surgeries     Family History  Problem Relation Age of Onset  . Asthma Father   . Asthma Sister    Social History  Substance Use Topics  . Smoking status: Never Smoker   . Smokeless tobacco: Never Used  . Alcohol Use: No   OB History    Gravida Para Term Preterm AB TAB SAB Ectopic Multiple Living   0 0 0 0 0 0 2      Review of Systems  Constitutional: Negative for fever, chills, activity change, appetite change and fatigue.  HENT: Positive for sore throat. Negative for congestion.   Eyes: Negative for visual disturbance.  Respiratory:  Negative for cough and shortness of breath.   Cardiovascular: Negative for chest pain.  Gastrointestinal: Positive for abdominal pain. Negative for nausea, vomiting, diarrhea, constipation and abdominal distention.  Genitourinary: Negative for dysuria, urgency and frequency.  Musculoskeletal: Negative for myalgias, back pain, arthralgias, neck pain and neck stiffness.  Skin: Negative for color change, pallor, rash and wound.  Neurological: Positive for dizziness and headaches. Negative for syncope, weakness, light-headedness and numbness.  All other systems reviewed and are negative.     Allergies  Review of patient's allergies indicates no known allergies.  Home Medications   Prior to Admission medications   Medication Sig Start Date End Date Taking? Authorizing Provider  acetaminophen (TYLENOL) 500 MG tablet Take 1,000 mg by mouth every 6 (six) hours as needed for mild pain or headache.   Yes Historical Provider, MD  Albuterol Sulfate 108 (90 BASE) MCG/ACT AEPB Inhale into the lungs. Inhale 2 puffs every 4 hours by inhalation route   Yes Historical Provider, MD  aspirin-acetaminophen-caffeine (EXCEDRIN MIGRAINE) [phone] MG per tablet Take 1 tablet by mouth every 6 (six) hours as needed for headache.   Yes Historical Provider, MD  ibuprofen (ADVIL,MOTRIN) 600 MG tablet Take 1 tablet (600 mg total) by mouth every 6 (six) hours as needed. 06/02/15  Yes Derwood Kaplan, MD  levonorgestrel (MIRENA) 20 MCG/24HR IUD 1  each by Intrauterine route once.   Yes Historical Provider, MD  oxyCODONE-acetaminophen (PERCOCET/ROXICET) 5-325 MG per tablet Take 1 tablet by mouth every 4 (four) hours as needed for severe pain. 06/02/15  Yes Derwood Kaplan, MD  ferrous sulfate 325 (65 FE) MG tablet Take 1 tablet (325 mg total) by mouth daily with breakfast. Patient not taking: Reported on 07/07/2015 10/26/14   Nigel Bridgeman, CNM  metoCLOPramide (REGLAN) 10 MG tablet Take 1 tablet (10 mg total) by mouth every 6  (six) hours. Patient not taking: Reported on 07/07/2015 06/02/15   Ankit Nanavati, MD    BP 120/68 mmHg  Pulse 92  Temp(Src) 98.6 F (37 C) (Oral)  Resp 14  SpO2 98% Physical Exam  Constitutional: She is oriented to person, place, and time. She appears well-developed and well-nourished. No distress.  HENT:  Head: Normocephalic and atraumatic.  Right Ear: External ear normal.  Left Ear: External ear normal.  Nose: Nose normal.  Mouth/Throat: Uvula is midline, oropharynx is clear and moist and mucous membranes are normal. No oropharyngeal exudate.  Eyes: Conjunctivae, EOM and lids are normal. Pupils are equal, round, and reactive to light. Right eye exhibits no discharge. Left eye exhibits no discharge. No scleral icterus.  Neck: Normal range of motion. Neck supple.  Cardiovascular: Normal rate, regular rhythm, normal heart sounds, intact distal pulses and normal pulses.   Pulmonary/Chest: Effort normal and breath sounds normal. No respiratory distress. She has no wheezes. She has no rales.  Abdominal: Soft. Normal appearance and bowel sounds are normal. She exhibits no distension and no mass. There is tenderness. There is no rigidity, no rebound and no guarding.  Mild tenderness to palpation of periumbilical region of abdomen. No rebound, guarding, or palpable masses.  Musculoskeletal: Normal range of motion. She exhibits no edema or tenderness.  Neurological: She is alert and oriented to person, place, and time. She has normal strength. No cranial nerve deficit or sensory deficit.  Patient ambulates without difficulty.  Skin: Skin is warm, dry and intact. No rash noted. She is not diaphoretic. No erythema. No pallor.  Psychiatric: She has a normal mood and affect. Her speech is normal and behavior is normal. Judgment and thought content normal.  Nursing note and vitals reviewed.   ED Course  Procedures (including critical care time)  Labs Review Labs Reviewed  URINALYSIS, ROUTINE W  REFLEX MICROSCOPIC (NOT AT [facility]) - Abnormal; Notable for the following:    Hgb urine dipstick TRACE (*)    Ketones, ur 15 (*)    All other components within normal limits  CBC WITH DIFFERENTIAL/PLATELET - Abnormal; Notable for the following:    RBC 3.75 (*)    Hemoglobin 11.2 (*)    HCT 32.9 (*)    All other components within normal limits  COMPREHENSIVE METABOLIC PANEL - Abnormal; Notable for the following:    Potassium 3.3 (*)    CO2 21 (*)    Calcium 8.5 (*)    All other components within normal limits  LIPASE, BLOOD - Abnormal; Notable for the following:    Lipase 21 (*)    All other components within normal limits  RAPID STREP SCREEN (NOT AT [facility])  CULTURE, GROUP A STREP  URINE MICROSCOPIC-ADD ON  CBC WITH DIFFERENTIAL/PLATELET  POC URINE PREG, ED    Imaging Review No results found.   I have personally reviewed and evaluated these lab results as part of my medical decision-making.   EKG Interpretation None      MDM  Final diagnoses:  Headache, unspecified headache type  Abdominal pain, unspecified abdominal location  Sore throat   24 year old female presents with headache x 1 month and sore throat and abdominal pain x 3-4 days. Denies fever, chills, lightheadedness, syncope, chest pain, shortness of breath, N/V/D/C, dysuria, urgency, frequency.  Patient is afebrile. Vital signs stable. Alert and oriented x 3. Normal neuro exam with no focal deficit. No meningeal signs. Patient ambulates without difficulty. Heart RRR, lungs clear to auscultation bilaterally, abdomen soft, non-distended; minimal TTP of periumbilical region of abdomen with no rebound, guarding, or palpable masses.  Patient treated with fluids and migraine cocktail in the ED. Urine pregnancy negative. UA with no evidence of infection. Rapid strep negative, will send culture. CBC negative for leukocytosis, hemoglobin 11.2. CMP with potassium 3.3, repleted in the ED. Lipase unremarkable.   Patient  reports symptom resolution s/p medication administration. She is well-appearing and non-toxic. Patient to follow-up with PCP. Return precautions discussed.  BP 120/68 mmHg  Pulse 92  Temp(Src) 98.6 F (37 C) (Oral)  Resp 14  SpO2 98%     Mady Gemma, PA-C 07/08/15 0349  Leta Baptist, MD 07/08/15 [phone]

## 2015-07-08 LAB — CBC WITH DIFFERENTIAL/PLATELET
BASOS PCT: 0 %
Basophils Absolute: 0 10*3/uL (ref 0.0–0.1)
EOS ABS: 0 10*3/uL (ref 0.0–0.7)
EOS PCT: 0 %
HCT: 32.9 % — ABNORMAL LOW (ref 36.0–46.0)
Hemoglobin: 11.2 g/dL — ABNORMAL LOW (ref 12.0–15.0)
Lymphocytes Relative: 39 %
Lymphs Abs: 2.7 10*3/uL (ref 0.7–4.0)
MCH: 29.9 pg (ref 26.0–34.0)
MCHC: 34 g/dL (ref 30.0–36.0)
MCV: 87.7 fL (ref 78.0–100.0)
Monocytes Absolute: 0.5 10*3/uL (ref 0.1–1.0)
Monocytes Relative: 7 %
NEUTROS PCT: 54 %
Neutro Abs: 3.6 10*3/uL (ref 1.7–7.7)
Platelets: 291 10*3/uL (ref 150–400)
RBC: 3.75 MIL/uL — ABNORMAL LOW (ref 3.87–5.11)
RDW: 12.5 % (ref 11.5–15.5)
WBC: 6.8 10*3/uL (ref 4.0–10.5)

## 2015-07-08 LAB — URINALYSIS, ROUTINE W REFLEX MICROSCOPIC
Bilirubin Urine: NEGATIVE
Glucose, UA: NEGATIVE mg/dL
Ketones, ur: 15 mg/dL — AB
Leukocytes, UA: NEGATIVE
NITRITE: NEGATIVE
PH: 6.5 (ref 5.0–8.0)
Protein, ur: NEGATIVE mg/dL
Specific Gravity, Urine: 1.023 (ref 1.005–1.030)
UROBILINOGEN UA: 1 mg/dL (ref 0.0–1.0)

## 2015-07-08 LAB — URINE MICROSCOPIC-ADD ON

## 2015-07-08 LAB — COMPREHENSIVE METABOLIC PANEL
ALBUMIN: 3.8 g/dL (ref 3.5–5.0)
ALT: 14 U/L (ref 14–54)
AST: 16 U/L (ref 15–41)
Alkaline Phosphatase: 80 U/L (ref 38–126)
Anion gap: 9 (ref 5–15)
BUN: 11 mg/dL (ref 6–20)
CHLORIDE: 107 mmol/L (ref 101–111)
CO2: 21 mmol/L — AB (ref 22–32)
CREATININE: 0.73 mg/dL (ref 0.44–1.00)
Calcium: 8.5 mg/dL — ABNORMAL LOW (ref 8.9–10.3)
GFR calc non Af Amer: >60 mL/min (ref >60)
GLUCOSE: 93 mg/dL (ref 65–99)
Potassium: 3.3 mmol/L — ABNORMAL LOW (ref 3.5–5.1)
SODIUM: 137 mmol/L (ref 135–145)
Total Bilirubin: 0.5 mg/dL (ref 0.3–1.2)
Total Protein: 7.3 g/dL (ref 6.5–8.1)

## 2015-07-08 LAB — RAPID STREP SCREEN (MED CTR MEBANE ONLY): Streptococcus, Group A Screen (Direct): NEGATIVE

## 2015-07-08 LAB — LIPASE, BLOOD: Lipase: 21 U/L — ABNORMAL LOW (ref 22–51)

## 2015-07-08 MED ORDER — METOCLOPRAMIDE HCL 5 MG/ML IJ SOLN
10.0000 mg | Freq: Once | INTRAMUSCULAR | Status: AC
  Administered 2015-07-08: 10 mg via INTRAVENOUS
  Filled 2015-07-08: qty 2

## 2015-07-08 MED ORDER — KETOROLAC TROMETHAMINE 30 MG/ML IJ SOLN
30.0000 mg | Freq: Once | INTRAMUSCULAR | Status: AC
  Administered 2015-07-08: 30 mg via INTRAVENOUS
  Filled 2015-07-08: qty 1

## 2015-07-08 MED ORDER — IBUPROFEN 800 MG PO TABS: 800.0000 mg | ORAL_TABLET | Freq: Three times a day (TID) | ORAL | Status: DC

## 2015-07-08 MED ORDER — POTASSIUM CHLORIDE CRYS ER 20 MEQ PO TBCR
40.0000 meq | EXTENDED_RELEASE_TABLET | Freq: Once | ORAL | Status: AC
  Administered 2015-07-08: 40 meq via ORAL
  Filled 2015-07-08: qty 2

## 2015-07-08 MED ORDER — DIPHENHYDRAMINE HCL 50 MG/ML IJ SOLN
25.0000 mg | Freq: Once | INTRAMUSCULAR | Status: AC
  Administered 2015-07-08: 25 mg via INTRAVENOUS
  Filled 2015-07-08: qty 1

## 2015-07-08 MED ORDER — SODIUM CHLORIDE 0.9 % IV BOLUS (SEPSIS)
1000.0000 mL | Freq: Once | INTRAVENOUS | Status: AC
  Administered 2015-07-08: 1000 mL via INTRAVENOUS

## 2015-07-08 NOTE — Discharge Instructions (Signed)
1. Medications: ibuprofen, usual home medications 2. Treatment: rest, drink plenty of fluids  3. Follow Up: please followup with your primary doctorfor discussion of your diagnoses and further evaluation after today's visit; please return to the ER for severe headache, dizziness, lightheadedness, high fever, persistent vomiting, new or worsening symptoms   Abdominal Pain, Women Abdominal (stomach, pelvic, or belly) pain can be caused by many things. It is important to tell your doctor:  The location of the pain.  Does it come and go or is it present all the time?  Are there things that start the pain (eating certain foods, exercise)?  Are there other symptoms associated with the pain (fever, nausea, vomiting, diarrhea)? All of this is helpful to know when trying to find the cause of the pain. CAUSES   Stomach: virus or bacteria infection, or ulcer.  Intestine: appendicitis (inflamed appendix), regional ileitis (Crohn's disease), ulcerative colitis (inflamed colon), irritable bowel syndrome, diverticulitis (inflamed diverticulum of the colon), or cancer of the stomach or intestine.  Gallbladder disease or stones in the gallbladder.  Kidney disease, kidney stones, or infection.  Pancreas infection or cancer.  Fibromyalgia (pain disorder).  Diseases of the female organs:  Uterus: fibroid (non-cancerous) tumors or infection.  Fallopian tubes: infection or tubal pregnancy.  Ovary: cysts or tumors.  Pelvic adhesions (scar tissue).  Endometriosis (uterus lining tissue growing in the pelvis and on the pelvic organs).  Pelvic congestion syndrome (female organs filling up with blood just before the menstrual period).  Pain with the menstrual period.  Pain with ovulation (producing an egg).  Pain with an IUD (intrauterine device, birth control) in the uterus.  Cancer of the female organs.  Functional pain (pain not caused by a disease, may improve without  treatment).  Psychological pain.  Depression. DIAGNOSIS  Your doctor will decide the seriousness of your pain by doing an examination.  Blood tests.  X-rays.  Ultrasound.  CT scan (computed tomography, special type of X-ray).  MRI (magnetic resonance imaging).  Cultures, for infection.  Barium enema (dye inserted in the large intestine, to better view it with X-rays).  Colonoscopy (looking in intestine with a lighted tube).  Laparoscopy (minor surgery, looking in abdomen with a lighted tube).  Major abdominal exploratory surgery (looking in abdomen with a large incision). TREATMENT  The treatment will depend on the cause of the pain.   Many cases can be observed and treated at home.  Over-the-counter medicines recommended by your caregiver.  Prescription medicine.  Antibiotics, for infection.  Birth control pills, for painful periods or for ovulation pain.  Hormone treatment, for endometriosis.  Nerve blocking injections.  Physical therapy.  Antidepressants.  Counseling with a psychologist or psychiatrist.  Minor or major surgery. HOME CARE INSTRUCTIONS   Do not take laxatives, unless directed by your caregiver.  Take over-the-counter pain medicine only if ordered by your caregiver. Do not take aspirin because it can cause an upset stomach or bleeding.  Try a clear liquid diet (broth or water) as ordered by your caregiver. Slowly move to a bland diet, as tolerated, if the pain is related to the stomach or intestine.  Have a thermometer and take your temperature several times a day, and record it.  Bed rest and sleep, if it helps the pain.  Avoid sexual intercourse, if it causes pain.  Avoid stressful situations.  Keep your follow-up appointments and tests, as your caregiver orders.  If the pain does not go away with medicine or surgery, you may  try:  Acupuncture.  Relaxation exercises (yoga, meditation).  Group therapy.  Counseling. SEEK  MEDICAL CARE IF:   You notice certain foods cause stomach pain.  Your home care treatment is not helping your pain.  You need stronger pain medicine.  You want your IUD removed.  You feel faint or lightheaded.  You develop nausea and vomiting.  You develop a rash.  You are having side effects or an allergy to your medicine. SEEK IMMEDIATE MEDICAL CARE IF:   Your pain does not go away or gets worse.  You have a fever.  Your pain is felt only in portions of the abdomen. The right side could possibly be appendicitis. The left lower portion of the abdomen could be colitis or diverticulitis.  You are passing blood in your stools (bright red or black tarry stools, with or without vomiting).  You have blood in your urine.  You develop chills, with or without a fever.  You pass out. MAKE SURE YOU:   Understand these instructions.  Will watch your condition.  Will get help right away if you are not doing well or get worse. Document Released: 08/05/2007 Document Revised: 02/22/2014 Document Reviewed: 08/25/2009 [facility] Patient Information 2015 [place], [place]. This information is not intended to replace advice given to you by your health care provider. Make sure you discuss any questions you have with your health care provider.  General Headache Without Cause A general headache is pain or discomfort felt around the head or neck area. The cause may not be found.  HOME CARE   Keep all doctor visits.  Only take medicines as told by your doctor.  Lie down in a dark, quiet room when you have a headache.  Keep a journal to find out if certain things bring on headaches. For example, write down:  What you eat and drink.  How much sleep you get.  Any change to your diet or medicines.  Relax by getting a massage or doing other relaxing activities.  Put ice or heat packs on the head and neck area as told by your doctor.  Lessen stress.  Sit up straight. Do not tighten  (tense) your muscles.  Quit smoking if you smoke.  Lessen how much alcohol you drink.  Lessen how much caffeine you drink, or stop drinking caffeine.  Eat and sleep on a regular schedule.  Get 7 to 9 hours of sleep, or as told by your doctor.  Keep lights dim if bright lights bother you or make your headaches worse. GET HELP RIGHT AWAY IF:   Your headache becomes really bad.  You have a fever.  You have a stiff neck.  You have trouble seeing.  Your muscles are weak, or you lose muscle control.  You lose your balance or have trouble walking.  You feel like you will pass out (faint), or you pass out.  You have really bad symptoms that are different than your first symptoms.  You have problems with the medicines given to you by your doctor.  Your medicines do not work.  Your headache feels different than the other headaches.  You feel sick to your stomach (nauseous) or throw up (vomit). MAKE SURE YOU:   Understand these instructions.  Will watch your condition.  Will get help right away if you are not doing well or get worse. Document Released: 07/17/2008 Document Revised: 12/31/2011 Document Reviewed: 09/28/2011 [facility] Patient Information 2015 [place], [place]. This information is not intended to replace advice given to  you by your health care provider. Make sure you discuss any questions you have with your health care provider.  Pharyngitis Pharyngitis is a sore throat (pharynx). There is redness, pain, and swelling of your throat. HOME CARE   Drink enough fluids to keep your pee (urine) clear or pale yellow.  Only take medicine as told by your doctor.  You may get sick again if you do not take medicine as told. Finish your medicines, even if you start to feel better.  Do not take aspirin.  Rest.  Rinse your mouth (gargle) with salt water ( tsp of salt per 1 qt of water) every 1-2 hours. This will help the pain.  If you are not at risk for choking, you  can suck on hard candy or sore throat lozenges. GET HELP IF:  You have large, tender lumps on your neck.  You have a rash.  You cough up green, yellow-brown, or bloody spit. GET HELP RIGHT AWAY IF:   You have a stiff neck.  You drool or cannot swallow liquids.  You throw up (vomit) or are not able to keep medicine or liquids down.  You have very bad pain that does not go away with medicine.  You have problems breathing (not from a stuffy nose). MAKE SURE YOU:   Understand these instructions.  Will watch your condition.  Will get help right away if you are not doing well or get worse. Document Released: 03/26/2008 Document Revised: 07/29/2013 Document Reviewed: 06/15/2013 [facility] Patient Information 2015 [place], [place]. This information is not intended to replace advice given to you by your health care provider. Make sure you discuss any questions you have with your health care provider.

## 2015-07-10 LAB — CULTURE, GROUP A STREP: STREP A CULTURE: NEGATIVE

## 2015-12-11 ENCOUNTER — Inpatient Hospital Stay (HOSPITAL_COMMUNITY)
Admission: AD | Admit: 2015-12-11 | Discharge: 2015-12-11 | Disposition: A | Discharge status: Home / Self Care | Payer: Medicaid Other | Source: Ambulatory Visit | Attending: Obstetrics & Gynecology | Admitting: Obstetrics & Gynecology

## 2015-12-11 ENCOUNTER — Encounter (HOSPITAL_COMMUNITY): Payer: Self-pay | Admitting: *Deleted

## 2015-12-11 DIAGNOSIS — R102 Pelvic and perineal pain: Secondary | ICD-10-CM | POA: Insufficient documentation

## 2015-12-11 DIAGNOSIS — N898 Other specified noninflammatory disorders of vagina: Secondary | ICD-10-CM | POA: Insufficient documentation

## 2015-12-11 DIAGNOSIS — L720 Epidermal cyst: Secondary | ICD-10-CM

## 2015-12-11 DIAGNOSIS — N760 Acute vaginitis: Secondary | ICD-10-CM | POA: Insufficient documentation

## 2015-12-11 LAB — WET PREP, GENITAL
SPERM: NONE SEEN
TRICH WET PREP: NONE SEEN
YEAST WET PREP: NONE SEEN

## 2015-12-11 MED ORDER — CEPHALEXIN 500 MG PO CAPS: 500.0000 mg | ORAL_CAPSULE | Freq: Two times a day (BID) | ORAL | Status: DC

## 2015-12-11 MED ORDER — METRONIDAZOLE 0.75 % VA GEL: 1.0000 | Freq: Every day | VAGINAL | Status: DC

## 2015-12-11 NOTE — MAU Note (Signed)
Pt reports that she has  "lump" near her labia. It has been there since Thursday and has grown in size and is more tender.
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
## 2015-12-11 NOTE — Discharge Instructions (Signed)
Epidermal Cyst An epidermal cyst is sometimes called a sebaceous cyst, epidermal inclusion cyst, or infundibular cyst. These cysts usually contain a substance that looks "pasty" or "cheesy" and may have a bad smell. This substance is a protein called keratin. Epidermal cysts are usually found on the face, neck, or trunk. They may also occur in the vaginal area or other parts of the genitalia of both men and women. Epidermal cysts are usually small, painless, slow-growing bumps or lumps that move freely under the skin. It is important not to try to pop them. This may cause an infection and lead to tenderness and swelling. CAUSES  Epidermal cysts may be caused by a deep penetrating injury to the skin or a plugged hair follicle, often associated with acne. SYMPTOMS  Epidermal cysts can become inflamed and cause:  Redness.  Tenderness.  Increased temperature of the skin over the bumps or lumps.  Grayish-white, bad smelling material that drains from the bump or lump. DIAGNOSIS  Epidermal cysts are easily diagnosed by your caregiver during an exam. Rarely, a tissue sample (biopsy) may be taken to rule out other conditions that may resemble epidermal cysts. TREATMENT   Epidermal cysts often get better and disappear on their own. They are rarely ever cancerous.  If a cyst becomes infected, it may become inflamed and tender. This may require opening and draining the cyst. Treatment with antibiotics may be necessary. When the infection is gone, the cyst may be removed with minor surgery.  Small, inflamed cysts can often be treated with antibiotics or by injecting steroid medicines.  Sometimes, epidermal cysts become large and bothersome. If this happens, surgical removal in your caregiver's office may be necessary. HOME CARE INSTRUCTIONS  Only take over-the-counter or prescription medicines as directed by your caregiver.  Take your antibiotics as directed. Finish them even if you start to feel  better. SEEK MEDICAL CARE IF:   Your cyst becomes tender, red, or swollen.  Your condition is not improving or is getting worse.  You have any other questions or concerns. MAKE SURE YOU:  Understand these instructions.  Will watch your condition.  Will get help right away if you are not doing well or get worse.   This information is not intended to replace advice given to you by your health care provider. Make sure you discuss any questions you have with your health care provider.   Document Released: 09/08/2004 Document Revised: 12/31/2011 Document Reviewed: 04/16/2011 Elsevier Interactive Patient Education 2016 [employer] Bacterial Vaginosis Bacterial vaginosis is a vaginal infection that occurs when the normal balance of bacteria in the vagina is disrupted. It results from an overgrowth of certain bacteria. This is the most common vaginal infection in women of childbearing age. Treatment is important to prevent complications, especially in pregnant women, as it can cause a premature delivery. CAUSES  Bacterial vaginosis is caused by an increase in harmful bacteria that are normally present in smaller amounts in the vagina. Several different kinds of bacteria can cause bacterial vaginosis. However, the reason that the condition develops is not fully understood. RISK FACTORS Certain activities or behaviors can put you at an increased risk of developing bacterial vaginosis, including:  Having a new sex partner or multiple sex partners.  Douching.  Using an intrauterine device (IUD) for contraception. Women do not get bacterial vaginosis from toilet seats, bedding, swimming pools, or contact with objects around them. SIGNS AND SYMPTOMS  Some women with bacterial vaginosis have no signs or symptoms. Common symptoms  include:  Grey vaginal discharge.  A fishlike odor with discharge, especially after sexual intercourse.  Itching or burning of the vagina and vulva.  Burning or  pain with urination. DIAGNOSIS  Your health care provider will take a medical history and examine the vagina for signs of bacterial vaginosis. A sample of vaginal fluid may be taken. Your health care provider will look at this sample under a microscope to check for bacteria and abnormal cells. A vaginal pH test may also be done.  TREATMENT  Bacterial vaginosis may be treated with antibiotic medicines. These may be given in the form of a pill or a vaginal cream. A second round of antibiotics may be prescribed if the condition comes back after treatment. Because bacterial vaginosis increases your risk for sexually transmitted diseases, getting treated can help reduce your risk for chlamydia, gonorrhea, HIV, and herpes. HOME CARE INSTRUCTIONS   Only take over-the-counter or prescription medicines as directed by your health care provider.  If antibiotic medicine was prescribed, take it as directed. Make sure you finish it even if you start to feel better.  Tell all sexual partners that you have a vaginal infection. They should see their health care provider and be treated if they have problems, such as a mild rash or itching.  During treatment, it is important that you follow these instructions:  Avoid sexual activity or use condoms correctly.  Do not douche.  Avoid alcohol as directed by your health care provider.  Avoid breastfeeding as directed by your health care provider. SEEK MEDICAL CARE IF:   Your symptoms are not improving after 3 days of treatment.  You have increased discharge or pain.  You have a fever. MAKE SURE YOU:   Understand these instructions.  Will watch your condition.  Will get help right away if you are not doing well or get worse. FOR MORE INFORMATION  Centers for Disease Control and Prevention, Division of STD Prevention: [employer] American Sexual Health Association (ASHA): www.ashastd.org    This information is not intended to replace advice given  to you by your health care provider. Make sure you discuss any questions you have with your health care provider.   Document Released: 10/08/2005 Document Revised: 10/29/2014 Document Reviewed: 05/20/2013 Elsevier Interactive Patient Education [employer]

## 2015-12-11 NOTE — MAU Note (Signed)
Pt states she noticed a lump on her vagina 4 days ago and it is painful and getting bigger.

## 2015-12-12 LAB — GC/CHLAMYDIA PROBE AMP (~~LOC~~) NOT AT ARMC
Chlamydia: NEGATIVE
Neisseria Gonorrhea: NEGATIVE

## 2015-12-12 LAB — RPR: RPR: NONREACTIVE

## 2015-12-13 LAB — HIV ANTIBODY (ROUTINE TESTING W REFLEX): HIV SCREEN 4TH GENERATION: NONREACTIVE

## 2015-12-13 LAB — HEPATITIS C ANTIBODY: HCV Ab: 0.1 s/co ratio (ref 0.0–0.9)

## 2015-12-13 LAB — HEPATITIS B SURFACE ANTIBODY,QUALITATIVE: Hep B S Ab: NONREACTIVE
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
## 2015-12-20 IMAGING — CR DG CHEST 1V PORT
1 series · 1 of 1 positions shown · non-contrast
Comparison: None.

CLINICAL DATA: Chest pain and shortness of breath.

EXAM:
PORTABLE CHEST - 1 VIEW

[AP]
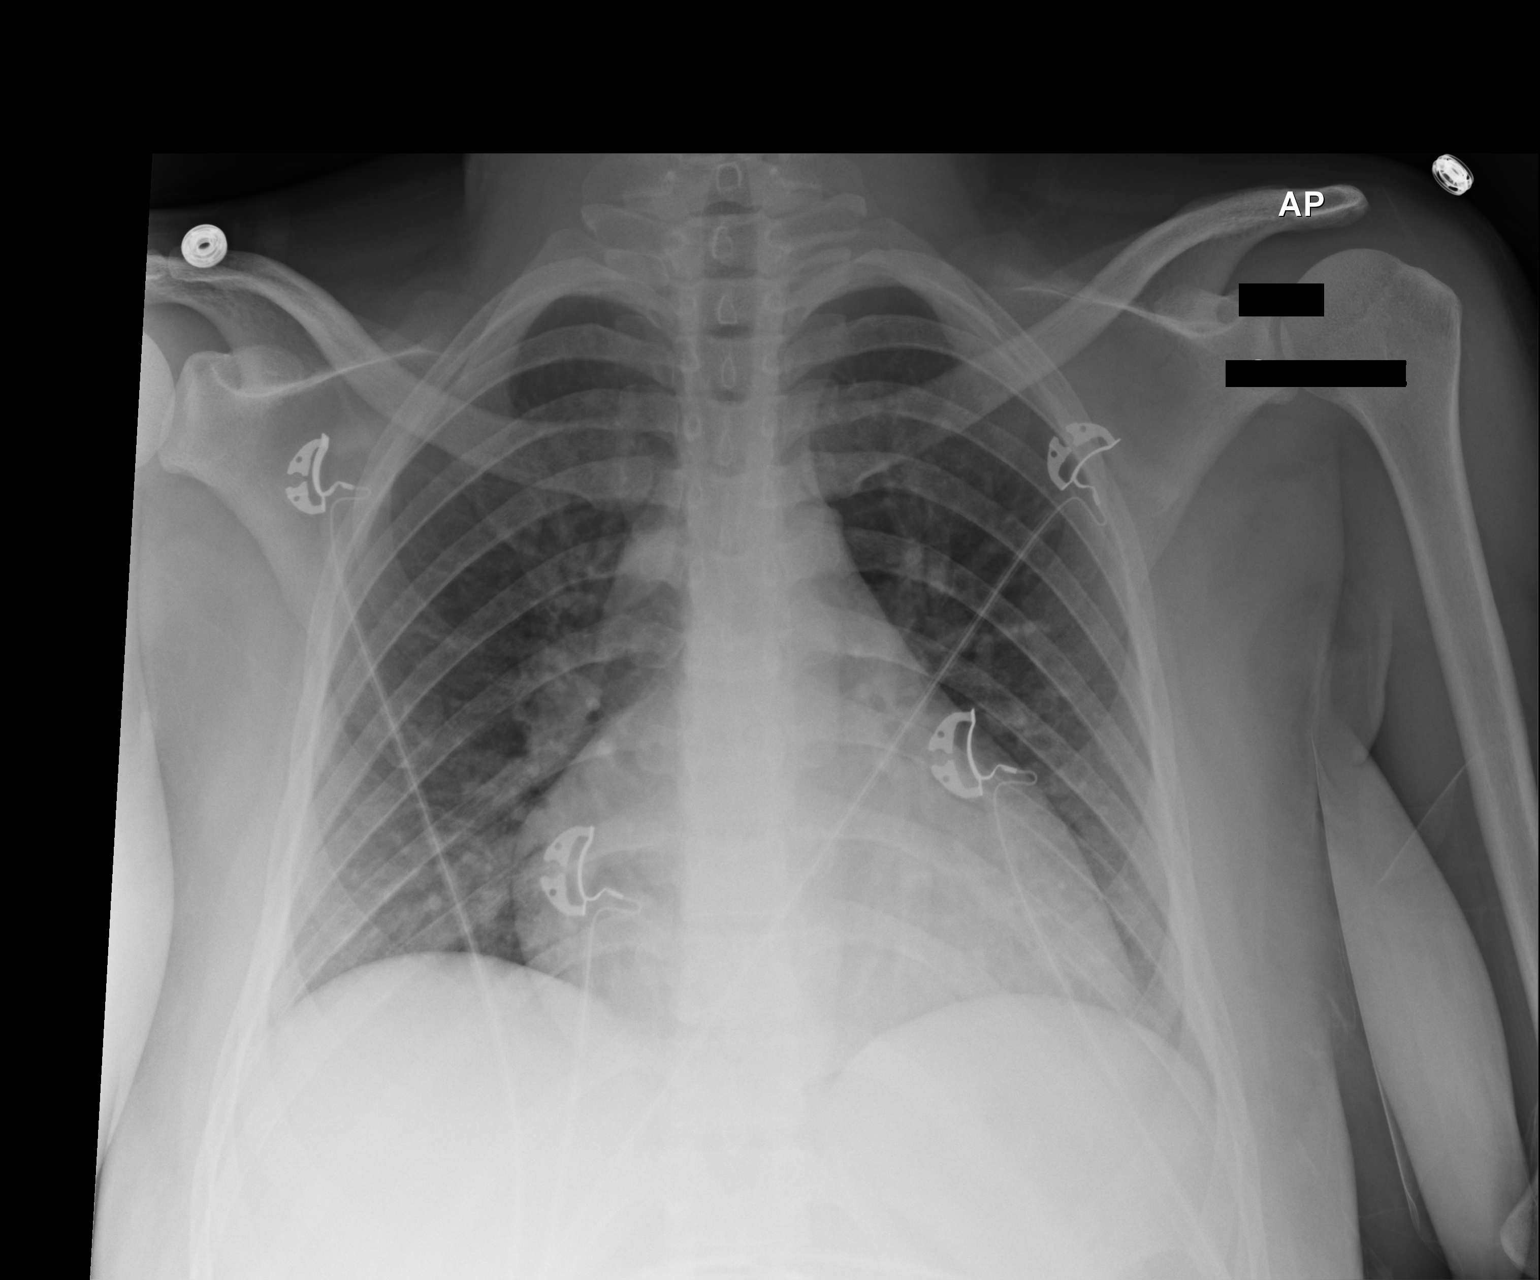

[1 of 1 positions shown; findings below may reference images not displayed]

FINDINGS: Heart size appears slightly prominent either in considering the AP
portable technique. Pulmonary vascularity is normal. No effusions.
No osseous abnormality.
IMPRESSION: Slightly prominent cardiac silhouette.  Otherwise, normal exam.
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
## 2016-03-05 ENCOUNTER — Emergency Department (HOSPITAL_COMMUNITY)
Admission: EM | Admit: 2016-03-05 | Discharge: 2016-03-05 | Disposition: A | Discharge status: Home / Self Care | Payer: Medicaid Other | Attending: Emergency Medicine | Admitting: Emergency Medicine

## 2016-03-05 ENCOUNTER — Encounter (HOSPITAL_COMMUNITY): Payer: Self-pay | Admitting: Emergency Medicine

## 2016-03-05 DIAGNOSIS — Z7952 Long term (current) use of systemic steroids: Secondary | ICD-10-CM | POA: Insufficient documentation

## 2016-03-05 DIAGNOSIS — Z791 Long term (current) use of non-steroidal anti-inflammatories (NSAID): Secondary | ICD-10-CM | POA: Insufficient documentation

## 2016-03-05 DIAGNOSIS — Z79899 Other long term (current) drug therapy: Secondary | ICD-10-CM | POA: Insufficient documentation

## 2016-03-05 DIAGNOSIS — J40 Bronchitis, not specified as acute or chronic: Secondary | ICD-10-CM | POA: Insufficient documentation

## 2016-03-05 DIAGNOSIS — Z792 Long term (current) use of antibiotics: Secondary | ICD-10-CM | POA: Insufficient documentation

## 2016-03-05 MED ORDER — BENZONATATE 100 MG PO CAPS: 100.0000 mg | ORAL_CAPSULE | Freq: Three times a day (TID) | ORAL | Status: DC

## 2016-03-05 MED ORDER — ALBUTEROL SULFATE HFA 108 (90 BASE) MCG/ACT IN AERS
2.0000 | INHALATION_SPRAY | RESPIRATORY_TRACT | Status: AC
  Administered 2016-03-05: 2 via RESPIRATORY_TRACT
  Filled 2016-03-05: qty 6.7

## 2016-03-05 MED ORDER — METHYLPREDNISOLONE 4 MG PO TBPK: ORAL_TABLET | ORAL | Status: DC

## 2016-03-05 NOTE — ED Notes (Signed)
Pt states she has a productive cough. Pt states she has central rib pain that is reproducible and is worse when she coughs or takes a deep breath. Pt is congested and has a sore throat. Pt states that 1 time today she coughed so hard she experienced an episode of emesis. Pt states that when she coughs her head hurts

## 2016-03-05 NOTE — ED Notes (Signed)
Pt reports cough for the last several weeks predominantly dry. Lung fields clear. No excessive work of breathing noted.

## 2016-03-05 NOTE — Discharge Instructions (Signed)

## 2016-03-05 NOTE — ED Notes (Signed)
Pt reports understanding of discharge information. No questions at time of discharge 

## 2016-03-05 NOTE — ED Provider Notes (Signed)
CSN: [id number]     Arrival date & time 03/05/16  0142 History   First MD Initiated Contact with Patient 03/05/16 [id number]     Chief Complaint  Patient presents with  . Cough     (Consider location/radiation/quality/duration/timing/severity/associated sxs/prior Treatment) HPI Comments: The patient is a 25 year old female who presents with cough for the last 2 weeks after getting over an upper respiratory infection. She states that everybody in her house has been spreading around a cough-like illness. She reports the cough is dry, occasionally has some small amount of sputum, she has pain with deep breathing in her chest and a persistent cough through the day and the night. Over-the-counter medications without relief. No fevers or chills. No nausea or vomiting. No history of reactive airway disease but she has used an albuterol inhaler in the past for bronchitis  Patient is a 25 y.o. female presenting with cough. The history is provided by the patient.  Cough   Past Medical History  Diagnosis Date  . Pregnancy   . Anemia   . Blood transfusion affecting pregnancy   . PPH (postpartum hemorrhage)     last delivery and current delivery of 2016  . Chicken pox     as a child   Past Surgical History  Procedure Laterality Date  . No past surgeries     Family History  Problem Relation Age of Onset  . Asthma Father   . Asthma Sister    Social History  Substance Use Topics  . Smoking status: Never Smoker   . Smokeless tobacco: Never Used  . Alcohol Use: No   OB History    Gravida Para Term Preterm AB TAB SAB Ectopic Multiple Living   2 2 2  0 0 0 0 0 0 2     Review of Systems  Respiratory: Positive for cough.   All other systems reviewed and are negative.     Allergies  Review of patient's allergies indicates no known allergies.  Home Medications   Prior to Admission medications   Medication Sig Start Date End Date Taking? Authorizing Provider  benzonatate (TESSALON) 100 MG  capsule Take 1 capsule (100 mg total) by mouth every 8 (eight) hours. 03/05/16   Eber HongBrian Keondria Siever, MD  cephALEXin (KEFLEX) 500 MG capsule Take 1 capsule (500 mg total) by mouth 2 (two) times daily. For 5 days 12/11/15   Gerrit HeckJessica Emly, CNM  ibuprofen (ADVIL,MOTRIN) 800 MG tablet Take 1 tablet (800 mg total) by mouth 3 (three) times daily. 07/08/15   Mady GemmaElizabeth C Westfall, PA-C  levonorgestrel (MIRENA) 20 MCG/24HR IUD 1 each by Intrauterine route once.    Historical Provider, MD  methylPREDNISolone (MEDROL DOSEPAK) 4 MG TBPK tablet 6 day taper 03/05/16   Eber HongBrian Aviva Wolfer, MD  metroNIDAZOLE (METROGEL VAGINAL) 0.75 % vaginal gel Place 1 Applicatorful vaginally at bedtime. Insert one applicator, at bedtime, for 5 nights. 12/11/15   Gerrit HeckJessica Emly, CNM   BP 128/59 mmHg  Pulse 84  Temp(Src) 98 F (36.7 C) (Oral)  Resp 16  Ht 5\' 6"  (1.676 m)  Wt 180 lb (81.647 kg)  BMI 29.07 kg/m2  SpO2 99%  LMP 02/12/2016 Physical Exam  Constitutional: She appears well-developed and well-nourished. No distress.  HENT:  Head: Normocephalic and atraumatic.  Mouth/Throat: Oropharynx is clear and moist. No oropharyngeal exudate.  Oropharynx is clear and moist, nasal passages with some clear rhinorrhea  Eyes: Conjunctivae and EOM are normal. Pupils are equal, round, and reactive to light. Right eye exhibits no discharge.  Left eye exhibits no discharge. No scleral icterus.  Neck: Normal range of motion. Neck supple. No JVD present. No thyromegaly present.  Cardiovascular: Normal rate, regular rhythm, normal heart sounds and intact distal pulses.  Exam reveals no gallop and no friction rub.   No murmur heard. No tachycardia, strong pulses, no JVD or peripheral edema  Pulmonary/Chest: Effort normal and breath sounds normal. No respiratory distress. She has no wheezes. She has no rales.  Lung sounds are clear, there is no distress, speaks in full sentences, no increased work of breathing or accessory muscle use  Abdominal: Soft. Bowel  sounds are normal. She exhibits no distension and no mass. There is no tenderness.  Musculoskeletal: Normal range of motion. She exhibits no edema or tenderness.  Lymphadenopathy:    She has no cervical adenopathy.  Neurological: She is alert. Coordination normal.  Skin: Skin is warm and dry. No rash noted. No erythema.  Psychiatric: She has a normal mood and affect. Her behavior is normal.  Nursing note and vitals reviewed.   ED Course  Procedures (including critical care time) Labs Review Labs Reviewed - No data to display  Imaging Review No results found. I have personally reviewed and evaluated these images and lab results as part of my medical decision-making.    MDM   Final diagnoses:  Bronchitis    Well-appearing, vitals normal, likely bronchitis, no indication for imaging, supportive care with medications as below, the patient is in total agreement, she was given an albuterol metered-dose inhaler with teaching prior to discharge  Meds given in ED:  Medications  albuterol (PROVENTIL HFA;VENTOLIN HFA) 108 (90 Base) MCG/ACT inhaler 2 puff (not administered)    New Prescriptions   BENZONATATE (TESSALON) 100 MG CAPSULE    Take 1 capsule (100 mg total) by mouth every 8 (eight) hours.   METHYLPREDNISOLONE (MEDROL DOSEPAK) 4 MG TBPK TABLET    6 day taper        Eber Hong, MD 03/05/16 [phone]

## 2016-11-28 ENCOUNTER — Emergency Department (HOSPITAL_COMMUNITY): Payer: Self-pay

## 2016-11-28 ENCOUNTER — Encounter (HOSPITAL_COMMUNITY): Payer: Self-pay | Admitting: Emergency Medicine

## 2016-11-28 ENCOUNTER — Emergency Department (HOSPITAL_COMMUNITY)
Admission: EM | Admit: 2016-11-28 | Discharge: 2016-11-28 | Disposition: A | Discharge status: Home / Self Care | Payer: Self-pay | Attending: Emergency Medicine | Admitting: Emergency Medicine

## 2016-11-28 DIAGNOSIS — F410 Panic disorder [episodic paroxysmal anxiety] without agoraphobia: Secondary | ICD-10-CM | POA: Insufficient documentation

## 2016-11-28 DIAGNOSIS — R0602 Shortness of breath: Secondary | ICD-10-CM | POA: Insufficient documentation

## 2016-11-28 DIAGNOSIS — R059 Cough, unspecified: Secondary | ICD-10-CM

## 2016-11-28 DIAGNOSIS — R05 Cough: Secondary | ICD-10-CM | POA: Insufficient documentation

## 2016-11-28 LAB — BASIC METABOLIC PANEL
ANION GAP: 8 (ref 5–15)
BUN: 11 mg/dL (ref 6–20)
CALCIUM: 9.2 mg/dL (ref 8.9–10.3)
CO2: 23 mmol/L (ref 22–32)
Chloride: 106 mmol/L (ref 101–111)
Creatinine, Ser: 0.82 mg/dL (ref 0.44–1.00)
GFR calc Af Amer: >60 mL/min (ref >60)
GFR calc non Af Amer: >60 mL/min (ref >60)
GLUCOSE: 94 mg/dL (ref 65–99)
Potassium: 3.6 mmol/L (ref 3.5–5.1)
Sodium: 137 mmol/L (ref 135–145)

## 2016-11-28 LAB — URINALYSIS, ROUTINE W REFLEX MICROSCOPIC
Bilirubin Urine: NEGATIVE
Glucose, UA: NEGATIVE mg/dL
Hgb urine dipstick: NEGATIVE
KETONES UR: 5 mg/dL — AB
Nitrite: NEGATIVE
PROTEIN: NEGATIVE mg/dL
Specific Gravity, Urine: 1.019 (ref 1.005–1.030)
pH: 6 (ref 5.0–8.0)

## 2016-11-28 LAB — CBC WITH DIFFERENTIAL/PLATELET
Basophils Absolute: 0 10*3/uL (ref 0.0–0.1)
Basophils Relative: 0 %
Eosinophils Absolute: 0 10*3/uL (ref 0.0–0.7)
Eosinophils Relative: 1 %
HEMATOCRIT: 36.2 % (ref 36.0–46.0)
Hemoglobin: 12.4 g/dL (ref 12.0–15.0)
Lymphocytes Relative: 23 %
Lymphs Abs: 1.8 10*3/uL (ref 0.7–4.0)
MCH: 30.6 pg (ref 26.0–34.0)
MCHC: 34.3 g/dL (ref 30.0–36.0)
MCV: 89.4 fL (ref 78.0–100.0)
MONO ABS: 0.4 10*3/uL (ref 0.1–1.0)
MONOS PCT: 5 %
NEUTROS ABS: 5.5 10*3/uL (ref 1.7–7.7)
Neutrophils Relative %: 71 %
Platelets: 309 10*3/uL (ref 150–400)
RBC: 4.05 MIL/uL (ref 3.87–5.11)
RDW: 12.3 % (ref 11.5–15.5)
WBC: 7.7 10*3/uL (ref 4.0–10.5)

## 2016-11-28 LAB — POC URINE PREG, ED: PREG TEST UR: NEGATIVE

## 2016-11-28 LAB — TROPONIN I

## 2016-11-28 LAB — D-DIMER, QUANTITATIVE: D-Dimer, Quant: 0.62 ug/mL-FEU — ABNORMAL HIGH (ref 0.00–0.50)

## 2016-11-28 MED ORDER — ACETAMINOPHEN 325 MG PO TABS
650.0000 mg | ORAL_TABLET | Freq: Once | ORAL | Status: AC
  Administered 2016-11-28: 650 mg via ORAL
  Filled 2016-11-28: qty 2

## 2016-11-28 MED ORDER — SODIUM CHLORIDE 0.9 % IV BOLUS (SEPSIS)
1000.0000 mL | Freq: Once | INTRAVENOUS | Status: AC
  Administered 2016-11-28: 1000 mL via INTRAVENOUS

## 2016-11-28 MED ORDER — IOPAMIDOL (ISOVUE-370) INJECTION 76%
100.0000 mL | Freq: Once | INTRAVENOUS | Status: AC | PRN
  Administered 2016-11-28: 100 mL via INTRAVENOUS

## 2016-11-28 NOTE — ED Triage Notes (Signed)
Per EMS pt was hyperventilating and was given 2 mg of ativan.  Pt states that she has not had a panic attack in 2 years.  RR was in the 40s and heart rate 140s. EMS were not able to get VS due to pt hyperventilating and having the shakes.
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
## 2016-11-28 NOTE — ED Provider Notes (Signed)
5:40 PM Assumed care from Dr. Clarene DukeMcManus, please see their note for full history, physical and decision making until this point. In brief this is a 26 y.o. year old female who presented to the ED tonight with Panic Attack     Patient signed out to me awaiting a CT scan to evaluate for pulmonary embolus.   CT negative. On reexam still slightly tachycardic and she is describing thick mucous when it is normally more fluid. She appears dry on exam so another liter of fluid given and HR improved. No more anxiety. Plan for discharge to follow up with PCP for further control of her anxiety.   Discharge instructions, including strict return precautions for new or worsening symptoms, given. Patient and/or family verbalized understanding and agreement with the plan as described.   Labs, studies and imaging reviewed by myself and considered in medical decision making if ordered. Imaging interpreted by radiology.   Labs Reviewed  D-DIMER, QUANTITATIVE (NOT AT [facility]) - Abnormal; Notable for the following:       Result Value   D-Dimer, Quant 0.62 (*)    All other components within normal limits  URINALYSIS, ROUTINE W REFLEX MICROSCOPIC - Abnormal; Notable for the following:    Ketones, ur 5 (*)    Leukocytes, UA SMALL (*)    Bacteria, UA RARE (*)    All other components within normal limits  BASIC METABOLIC PANEL  CBC WITH DIFFERENTIAL/PLATELET  TROPONIN I  POC URINE PREG, ED    CT Angio Chest PE W/Cm &/Or Wo Cm  Final Result    DG Chest 2 View  Final Result      No Follow-up on file.    Marily MemosJason Desirae Mancusi, MD 11/28/16 1745

## 2017-02-27 ENCOUNTER — Encounter (HOSPITAL_COMMUNITY): Payer: Self-pay | Admitting: Emergency Medicine

## 2017-02-27 ENCOUNTER — Emergency Department (HOSPITAL_COMMUNITY)
Admission: EM | Admit: 2017-02-27 | Discharge: 2017-02-27 | Disposition: A | Discharge status: Home / Self Care | Payer: No Typology Code available for payment source | Attending: Emergency Medicine | Admitting: Emergency Medicine

## 2017-02-27 DIAGNOSIS — M62830 Muscle spasm of back: Secondary | ICD-10-CM

## 2017-02-27 DIAGNOSIS — Y92410 Unspecified street and highway as the place of occurrence of the external cause: Secondary | ICD-10-CM | POA: Diagnosis not present

## 2017-02-27 DIAGNOSIS — T148XXA Other injury of unspecified body region, initial encounter: Secondary | ICD-10-CM

## 2017-02-27 DIAGNOSIS — M545 Low back pain, unspecified: Secondary | ICD-10-CM

## 2017-02-27 DIAGNOSIS — Y999 Unspecified external cause status: Secondary | ICD-10-CM | POA: Diagnosis not present

## 2017-02-27 DIAGNOSIS — Y939 Activity, unspecified: Secondary | ICD-10-CM | POA: Diagnosis not present

## 2017-02-27 DIAGNOSIS — S3992XA Unspecified injury of lower back, initial encounter: Secondary | ICD-10-CM | POA: Diagnosis present

## 2017-02-27 DIAGNOSIS — S39012A Strain of muscle, fascia and tendon of lower back, initial encounter: Secondary | ICD-10-CM | POA: Diagnosis not present

## 2017-02-27 MED ORDER — CYCLOBENZAPRINE HCL 10 MG PO TABS
10.0000 mg | ORAL_TABLET | Freq: Once | ORAL | Status: AC
  Administered 2017-02-27: 10 mg via ORAL
  Filled 2017-02-27: qty 1

## 2017-02-27 MED ORDER — NAPROXEN 250 MG PO TABS
500.0000 mg | ORAL_TABLET | Freq: Once | ORAL | Status: AC
  Administered 2017-02-27: 500 mg via ORAL
  Filled 2017-02-27: qty 2

## 2017-02-27 MED ORDER — NAPROXEN 500 MG PO TABS: 500.0000 mg | ORAL_TABLET | Freq: Two times a day (BID) | ORAL | 0 refills | Status: DC

## 2017-02-27 MED ORDER — CYCLOBENZAPRINE HCL 10 MG PO TABS: 10.0000 mg | ORAL_TABLET | Freq: Three times a day (TID) | ORAL | 0 refills | Status: DC | PRN

## 2017-02-27 NOTE — Discharge Instructions (Signed)
Take naprosyn as directed for inflammation and pain with tylenol for breakthrough pain and flexeril for muscle relaxation. Do not drive or operate machinery with muscle relaxant use. Use heat and massage the areas of soreness, using a heating pad for no more than 20 minutes at a time every hour. Expect to be sore for the next few days and follow up with primary care physician for recheck of ongoing symptoms in the next 1-2 weeks. Return to ER for emergent changing or worsening of symptoms.
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
## 2017-03-01 ENCOUNTER — Inpatient Hospital Stay (HOSPITAL_COMMUNITY)
Admission: AD | Admit: 2017-03-01 | Discharge: 2017-03-01 | Disposition: A | Discharge status: Home / Self Care | Payer: Medicaid Other | Source: Ambulatory Visit | Attending: Family Medicine | Admitting: Family Medicine

## 2017-03-01 ENCOUNTER — Encounter (HOSPITAL_COMMUNITY): Payer: Self-pay | Admitting: Student

## 2017-03-01 DIAGNOSIS — Z308 Encounter for other contraceptive management: Secondary | ICD-10-CM | POA: Insufficient documentation

## 2017-03-01 DIAGNOSIS — Z711 Person with feared health complaint in whom no diagnosis is made: Secondary | ICD-10-CM

## 2017-03-01 NOTE — Progress Notes (Signed)
Called pt's name, Pt not in lobby.

## 2017-03-01 NOTE — MAU Provider Note (Signed)
S: Rachel Sanders is a 26 y.o. [id number] female who presents for contraception management & STD testing. Patient initially signed in for STD testing, but is now stating she doesn't want STD testing b/c [facility]. No known contacts.  Patient has paragard that was placed 2 years ago by [facility]. Wants it removed so she can "try for a girl".  Denies abdominal pain, vaginal discharge, or vaginal bleeding.   O: BP 131/82 (BP Location: Right Arm)   Pulse 93   Temp 98.3 F (36.8 C) (Oral)   Resp 16   Ht 5\' 6"  (1.676 m)   Wt 182 lb 12 oz (82.9 kg)   LMP 02/23/2017   SpO2 99%   BMI 29.50 kg/m   Physical Examination: General appearance - alert, well appearing, and in no distress HENT- normocephalic, atraumatic Mental status - normal mood, behavior, speech, dress, motor activity, and thought processes Respiratory - unlabored breathing, no respiratory distress   A: 1. Physically well but worried    P: Discharge home Given list of local providers  F/u with [facility] for STD screening F/u with gyn for contraception management Discussed reasons to return to MAU  Judeth HornLawrence, Remy Dia, NP

## 2017-03-01 NOTE — MAU Note (Signed)
I want to get my birth control removed. Wants STD testing but thinks has to pay out of pocket when bill comes so wants to go to health dept for that.

## 2017-03-05 ENCOUNTER — Emergency Department (HOSPITAL_COMMUNITY)
Admission: EM | Admit: 2017-03-05 | Discharge: 2017-03-05 | Disposition: A | Discharge status: Home / Self Care | Payer: Self-pay | Attending: Emergency Medicine | Admitting: Emergency Medicine

## 2017-03-05 ENCOUNTER — Emergency Department (HOSPITAL_COMMUNITY): Payer: Self-pay

## 2017-03-05 ENCOUNTER — Encounter (HOSPITAL_COMMUNITY): Payer: Self-pay | Admitting: Nurse Practitioner

## 2017-03-05 DIAGNOSIS — Z5321 Procedure and treatment not carried out due to patient leaving prior to being seen by health care provider: Secondary | ICD-10-CM | POA: Insufficient documentation

## 2017-03-05 DIAGNOSIS — R05 Cough: Secondary | ICD-10-CM | POA: Insufficient documentation

## 2017-03-05 MED ORDER — ALBUTEROL SULFATE (2.5 MG/3ML) 0.083% IN NEBU: 5.0000 mg | INHALATION_SOLUTION | Freq: Once | RESPIRATORY_TRACT | Status: DC

## 2017-03-05 NOTE — ED Triage Notes (Signed)
Pt presents with c/o cough. The cough began on Friday. Since then shes developed SOB and pain in her chest when coughing. She reports coughing thick yellow sputum. She denies fevers. She tried muscinex with no improvement

## 2017-03-06 ENCOUNTER — Encounter (HOSPITAL_COMMUNITY): Payer: Self-pay

## 2017-03-06 ENCOUNTER — Emergency Department (HOSPITAL_COMMUNITY): Payer: Self-pay

## 2017-03-06 ENCOUNTER — Emergency Department (HOSPITAL_COMMUNITY)
Admission: EM | Admit: 2017-03-06 | Discharge: 2017-03-07 | Disposition: A | Discharge status: Home / Self Care | Payer: Self-pay | Attending: Emergency Medicine | Admitting: Emergency Medicine

## 2017-03-06 DIAGNOSIS — N342 Other urethritis: Secondary | ICD-10-CM

## 2017-03-06 DIAGNOSIS — J189 Pneumonia, unspecified organism: Secondary | ICD-10-CM | POA: Insufficient documentation

## 2017-03-06 DIAGNOSIS — N341 Nonspecific urethritis: Secondary | ICD-10-CM | POA: Insufficient documentation

## 2017-03-06 DIAGNOSIS — R0602 Shortness of breath: Secondary | ICD-10-CM | POA: Insufficient documentation

## 2017-03-06 DIAGNOSIS — J181 Lobar pneumonia, unspecified organism: Secondary | ICD-10-CM

## 2017-03-06 LAB — COMPREHENSIVE METABOLIC PANEL
ALBUMIN: 4.3 g/dL (ref 3.5–5.0)
ALT: 16 U/L (ref 14–54)
ANION GAP: 9 (ref 5–15)
AST: 19 U/L (ref 15–41)
Alkaline Phosphatase: 62 U/L (ref 38–126)
BILIRUBIN TOTAL: 0.7 mg/dL (ref 0.3–1.2)
BUN: 11 mg/dL (ref 6–20)
CO2: 22 mmol/L (ref 22–32)
Calcium: 9.2 mg/dL (ref 8.9–10.3)
Chloride: 104 mmol/L (ref 101–111)
Creatinine, Ser: 0.79 mg/dL (ref 0.44–1.00)
GFR calc Af Amer: >60 mL/min (ref >60)
GFR calc non Af Amer: >60 mL/min (ref >60)
GLUCOSE: 107 mg/dL — AB (ref 65–99)
POTASSIUM: 3.4 mmol/L — AB (ref 3.5–5.1)
SODIUM: 135 mmol/L (ref 135–145)
TOTAL PROTEIN: 7.5 g/dL (ref 6.5–8.1)

## 2017-03-06 LAB — I-STAT BETA HCG BLOOD, ED (MC, WL, AP ONLY)

## 2017-03-06 LAB — CBC WITH DIFFERENTIAL/PLATELET
BASOS PCT: 1 %
Basophils Absolute: 0 10*3/uL (ref 0.0–0.1)
EOS ABS: 0.1 10*3/uL (ref 0.0–0.7)
Eosinophils Relative: 2 %
HEMATOCRIT: 37.7 % (ref 36.0–46.0)
Hemoglobin: 12.2 g/dL (ref 12.0–15.0)
Lymphocytes Relative: 28 %
Lymphs Abs: 1.4 10*3/uL (ref 0.7–4.0)
MCH: 29 pg (ref 26.0–34.0)
MCHC: 32.4 g/dL (ref 30.0–36.0)
MCV: 89.5 fL (ref 78.0–100.0)
MONO ABS: 0.4 10*3/uL (ref 0.1–1.0)
MONOS PCT: 8 %
Neutro Abs: 3.1 10*3/uL (ref 1.7–7.7)
Neutrophils Relative %: 61 %
Platelets: 292 10*3/uL (ref 150–400)
RBC: 4.21 MIL/uL (ref 3.87–5.11)
RDW: 12.5 % (ref 11.5–15.5)
WBC: 5 10*3/uL (ref 4.0–10.5)

## 2017-03-06 LAB — I-STAT TROPONIN, ED: Troponin i, poc: 0 ng/mL (ref 0.00–0.08)

## 2017-03-06 LAB — URINALYSIS, ROUTINE W REFLEX MICROSCOPIC
BILIRUBIN URINE: NEGATIVE
Glucose, UA: NEGATIVE mg/dL
Hgb urine dipstick: NEGATIVE
Ketones, ur: 20 mg/dL — AB
Nitrite: NEGATIVE
PH: 6 (ref 5.0–8.0)
Protein, ur: NEGATIVE mg/dL
SPECIFIC GRAVITY, URINE: 1.032 — AB (ref 1.005–1.030)

## 2017-03-06 MED ORDER — LEVOFLOXACIN IN D5W 750 MG/150ML IV SOLN
750.0000 mg | Freq: Once | INTRAVENOUS | Status: AC
  Administered 2017-03-06: 750 mg via INTRAVENOUS
  Filled 2017-03-06: qty 150

## 2017-03-06 MED ORDER — KETOROLAC TROMETHAMINE 30 MG/ML IJ SOLN
30.0000 mg | Freq: Once | INTRAMUSCULAR | Status: AC
  Administered 2017-03-06: 30 mg via INTRAVENOUS
  Filled 2017-03-06: qty 1

## 2017-03-06 MED ORDER — AZITHROMYCIN 250 MG PO TABS: ORAL_TABLET | ORAL | 0 refills | Status: DC

## 2017-03-06 MED ORDER — SODIUM CHLORIDE 0.9 % IV BOLUS (SEPSIS)
1000.0000 mL | Freq: Once | INTRAVENOUS | Status: AC
  Administered 2017-03-06: 1000 mL via INTRAVENOUS

## 2017-03-06 MED ORDER — DIPHENHYDRAMINE HCL 50 MG/ML IJ SOLN
25.0000 mg | Freq: Once | INTRAMUSCULAR | Status: AC
  Administered 2017-03-06: 25 mg via INTRAVENOUS
  Filled 2017-03-06: qty 1

## 2017-03-06 MED ORDER — IPRATROPIUM BROMIDE 0.02 % IN SOLN
0.5000 mg | Freq: Once | RESPIRATORY_TRACT | Status: AC
  Administered 2017-03-06: 0.5 mg via RESPIRATORY_TRACT
  Filled 2017-03-06: qty 2.5

## 2017-03-06 MED ORDER — ALBUTEROL SULFATE (2.5 MG/3ML) 0.083% IN NEBU
5.0000 mg | INHALATION_SOLUTION | Freq: Once | RESPIRATORY_TRACT | Status: AC
  Administered 2017-03-06: 5 mg via RESPIRATORY_TRACT
  Filled 2017-03-06: qty 6

## 2017-03-06 MED ORDER — CEPHALEXIN 500 MG PO CAPS: 500.0000 mg | ORAL_CAPSULE | Freq: Three times a day (TID) | ORAL | 0 refills | Status: DC

## 2017-03-06 MED ORDER — METOPROLOL TARTRATE 5 MG/5ML IV SOLN
10.0000 mg | Freq: Once | INTRAVENOUS | Status: AC
  Administered 2017-03-06: 10 mg via INTRAVENOUS
  Filled 2017-03-06: qty 10

## 2017-03-06 MED ORDER — DEXTROSE 5 % IV SOLN
1.0000 g | Freq: Once | INTRAVENOUS | Status: AC
  Administered 2017-03-06: 1 g via INTRAVENOUS
  Filled 2017-03-06: qty 10

## 2017-03-06 MED ORDER — IOPAMIDOL (ISOVUE-370) INJECTION 76%
INTRAVENOUS | Status: AC
  Administered 2017-03-06: 100 mL
  Filled 2017-03-06: qty 100

## 2017-03-06 MED ORDER — IOPAMIDOL (ISOVUE-300) INJECTION 61%
INTRAVENOUS | Status: AC
  Filled 2017-03-06: qty 100

## 2017-03-06 NOTE — Care Management (Signed)
ED CM spoke with patient regarding medication assistance.Discussed EDP plans on discharging patient with antibiotics and concerned about patient being able to afford medication.  CM checked on [employer], and levaquin cost at Rachel Sanders is $15.20 she confirmed she would be able to afford the amount, [facility] to hand to patient. Also discussed follow up care at the [facility] patient is agreeable, Appointment scheduled for May 24th at 1:15 pm information placed on AVS. No further questions or concerns. No additional ED CM needs identified.

## 2017-03-06 NOTE — ED Notes (Signed)
MD made aware pts heart rate increased to 140s' after breathing treatment. Dr. Silverio LayYao states to continue bolus and monitor.

## 2017-03-06 NOTE — Discharge Instructions (Signed)
Take tylenol, motrin for fever.   Take keflex three times daily for a week.   Take zpack as prescribed.   Stay hydrated.   See your doctor  Return to ER if you have worse chest pain, shortness of breath, abdominal pain, vomiting, fever.
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
## 2017-03-07 LAB — URINE CULTURE: Culture: <10000 — AB

## 2017-03-14 ENCOUNTER — Inpatient Hospital Stay (INDEPENDENT_AMBULATORY_CARE_PROVIDER_SITE_OTHER): Payer: Medicaid Other | Admitting: Physician Assistant

## 2017-04-04 ENCOUNTER — Ambulatory Visit (INDEPENDENT_AMBULATORY_CARE_PROVIDER_SITE_OTHER): Payer: Self-pay | Admitting: Physician Assistant

## 2017-04-04 VITALS — BP 114/74 | HR 55 | Temp 98.3°F | Ht 65.0 in | Wt 176.8 lb

## 2017-04-04 DIAGNOSIS — J181 Lobar pneumonia, unspecified organism: Secondary | ICD-10-CM

## 2017-04-04 DIAGNOSIS — J189 Pneumonia, unspecified organism: Secondary | ICD-10-CM

## 2017-04-04 NOTE — Progress Notes (Signed)
  Subjective:  Patient ID: Rachel Sanders, female    DOB: 1991/03/04  Age: 26 y.o. MRN: [id number]  CC: hospital f/u pneumonia  HPI Rachel PersonsBrianna Sanders is a 26 y.o. female with no significant PMH presents on hospital f/u. Hospital visit on 03/06/17. She was in the ED one whole day for PNA. Discharged on Azithromycin and Cephalexin. Cough is a lot better and feels well now. Only complains of mid back pain attributed to her posture. Back pain waxes and wanes. Denies fever, chills, nausea, vomiting, chest pain, SOB, wheezing, HA, abdominal pain, rash, swelling, or GI/GU sxs.      Outpatient Medications Prior to Visit  Medication Sig Dispense Refill  . cyclobenzaprine (FLEXERIL) 10 MG tablet Take 1 tablet (10 mg total) by mouth 3 (three) times daily as needed for muscle spasms. 15 tablet 0  . PRESCRIPTION MEDICATION Birth control    . azithromycin (ZITHROMAX Z-PAK) 250 MG tablet 2 po day one, then 1 daily x 4 days (Patient not taking: Reported on 04/04/2017) 6 tablet 0  . cephALEXin (KEFLEX) 500 MG capsule Take 1 capsule (500 mg total) by mouth 3 (three) times daily. 21 capsule 0  . naproxen (NAPROSYN) 500 MG tablet Take 1 tablet (500 mg total) by mouth 2 (two) times daily with a meal. (Patient not taking: Reported on 04/04/2017) 20 tablet 0   No facility-administered medications prior to visit.      ROS Review of Systems  Constitutional: Negative for chills, fever and malaise/fatigue.  Eyes: Negative for blurred vision.  Respiratory: Negative for shortness of breath.   Cardiovascular: Negative for chest pain and palpitations.  Gastrointestinal: Negative for abdominal pain and nausea.  Genitourinary: Negative for dysuria and hematuria.  Musculoskeletal: Negative for joint pain and myalgias.  Skin: Negative for rash.  Neurological: Negative for tingling and headaches.  Psychiatric/Behavioral: Negative for depression. The patient is not nervous/anxious.     Objective:  BP 114/74 (BP Location: Right  Arm, Patient Position: Sitting, Cuff Size: Normal)   Pulse (!) 55   Temp 98.3 F (36.8 C) (Oral)   Ht 5\' 5"  (1.651 m)   Wt 176 lb 12.8 oz (80.2 kg)   LMP 02/27/2017 (Approximate)   SpO2 100%   Breastfeeding? No   BMI 29.42 kg/m   BP/Weight 04/04/2017 03/06/2017 03/05/2017  Systolic BP 114 126 129  Diastolic BP 74 97 83  Wt. (Lbs) 176.8 179 -  BMI 29.42 29.79 -      Physical Exam  Constitutional: She is oriented to person, place, and time.  Well developed, well nourished, NAD, polite  HENT:  Head: Normocephalic and atraumatic.  Eyes: No scleral icterus.  Neck: Normal range of motion. Neck supple. No thyromegaly present.  Cardiovascular: Normal rate, regular rhythm and normal heart sounds.   Pulmonary/Chest: Effort normal and breath sounds normal.  Musculoskeletal: She exhibits no edema.  Neurological: She is alert and oriented to person, place, and time.  Skin: Skin is warm and dry. No rash noted. No erythema. No pallor.  Psychiatric: She has a normal mood and affect. Her behavior is normal. Thought content normal.  Vitals reviewed.    Assessment & Plan:   1. Pneumonia of left lower lobe due to infectious organism (HCC) - Resolved. Instructed patient to call provider if symptoms return.   No orders of the defined types were placed in this encounter.   Follow-up: Return in about 4 weeks (around 05/02/2017) for full physical.   Loletta Specteroger David Zacory Fiola PA

## 2017-04-04 NOTE — Patient Instructions (Signed)
Community-Acquired Pneumonia, Adult °Pneumonia is an infection of the lungs. There are different types of pneumonia. One type can develop while a person is in a hospital. A different type, called community-acquired pneumonia, develops in people who are not, or have not recently been, in the hospital or other health care facility. °What are the causes? °Pneumonia may be caused by bacteria, viruses, or funguses. Community-acquired pneumonia is often caused by Streptococcus pneumonia bacteria. These bacteria are often passed from one person to another by breathing in droplets from the cough or sneeze of an infected person. °What increases the risk? °The condition is more likely to develop in: °· People who have chronic diseases, such as chronic obstructive pulmonary disease (COPD), asthma, congestive heart failure, cystic fibrosis, diabetes, or kidney disease. °· People who have early-stage or late-stage HIV. °· People who have sickle cell disease. °· People who have had their spleen removed (splenectomy). °· People who have poor dental hygiene. °· People who have medical conditions that increase the risk of breathing in (aspirating) secretions their own mouth and nose. °· People who have a weakened immune system (immunocompromised). °· People who smoke. °· People who travel to areas where pneumonia-causing germs commonly exist. °· People who are around animal habitats or animals that have pneumonia-causing germs, including birds, bats, rabbits, cats, and farm animals. ° °What are the signs or symptoms? °Symptoms of this condition include: °· A dry cough. °· A wet (productive) cough. °· Fever. °· Sweating. °· Chest pain, especially when breathing deeply or coughing. °· Rapid breathing or difficulty breathing. °· Shortness of breath. °· Shaking chills. °· Fatigue. °· Muscle aches. ° °How is this diagnosed? °Your health care provider will take a medical history and perform a physical exam. You may also have other tests,  including: °· Imaging studies of your chest, including X-rays. °· Tests to check your blood oxygen level and other blood gases. °· Other tests on blood, mucus (sputum), fluid around your lungs (pleural fluid), and urine. ° °If your pneumonia is severe, other tests may be done to identify the specific cause of your illness. °How is this treated? °The type of treatment that you receive depends on many factors, such as the cause of your pneumonia, the medicines you take, and other medical conditions that you have. For most adults, treatment and recovery from pneumonia may occur at home. In some cases, treatment must happen in a hospital. Treatment may include: °· Antibiotic medicines, if the pneumonia was caused by bacteria. °· Antiviral medicines, if the pneumonia was caused by a virus. °· Medicines that are given by mouth or through an IV tube. °· Oxygen. °· Respiratory therapy. ° °Although rare, treating severe pneumonia may include: °· Mechanical ventilation. This is done if you are not breathing well on your own and you cannot maintain a safe blood oxygen level. °· Thoracentesis. This procedure removes fluid around one lung or both lungs to help you breathe better. ° °Follow these instructions at home: °· Take over-the-counter and prescription medicines only as told by your health care provider. °? Only take cough medicine if you are losing sleep. Understand that cough medicine can prevent your body’s natural ability to remove mucus from your lungs. °? If you were prescribed an antibiotic medicine, take it as told by your health care provider. Do not stop taking the antibiotic even if you start to feel better. °· Sleep in a semi-upright position at night. Try sleeping in a reclining chair, or place a few pillows under your head. °· Do not use tobacco products, including cigarettes, chewing   tobacco, and e-cigarettes. If you need help quitting, ask your health care provider. °· Drink enough water to keep your urine  clear or pale yellow. This will help to thin out mucus secretions in your lungs. °How is this prevented? °There are ways that you can decrease your risk of developing community-acquired pneumonia. Consider getting a pneumococcal vaccine if: °· You are older than 24 years of age. °· You are older than 24 years of age and are undergoing cancer treatment, have chronic lung disease, or have other medical conditions that affect your immune system. Ask your health care provider if this applies to you. ° °There are different types and schedules of pneumococcal vaccines. Ask your health care provider which vaccination option is best for you. °You may also prevent community-acquired pneumonia if you take these actions: °· Get an influenza vaccine every year. Ask your health care provider which type of influenza vaccine is best for you. °· Go to the dentist on a regular basis. °· Wash your hands often. Use hand sanitizer if soap and water are not available. ° °Contact a health care provider if: °· You have a fever. °· You are losing sleep because you cannot control your cough with cough medicine. °Get help right away if: °· You have worsening shortness of breath. °· You have increased chest pain. °· Your sickness becomes worse, especially if you are an older adult or have a weakened immune system. °· You cough up blood. °This information is not intended to replace advice given to you by your health care provider. Make sure you discuss any questions you have with your health care provider. °Document Released: 10/08/2005 Document Revised: 02/16/2016 Document Reviewed: 02/02/2015 °Elsevier Interactive Patient Education © 2017 Elsevier Inc. ° °

## 2017-04-05 ENCOUNTER — Encounter (INDEPENDENT_AMBULATORY_CARE_PROVIDER_SITE_OTHER): Payer: Self-pay | Admitting: Physician Assistant

## 2017-07-09 ENCOUNTER — Other Ambulatory Visit: Payer: Self-pay | Admitting: Advanced Practice Midwife

## 2017-07-09 ENCOUNTER — Inpatient Hospital Stay (HOSPITAL_COMMUNITY)
Admission: AD | Admit: 2017-07-09 | Discharge: 2017-07-09 | Disposition: A | Discharge status: Home / Self Care | Payer: Self-pay | Source: Ambulatory Visit | Attending: Family Medicine | Admitting: Family Medicine

## 2017-07-09 ENCOUNTER — Encounter (HOSPITAL_COMMUNITY): Payer: Self-pay | Admitting: *Deleted

## 2017-07-09 DIAGNOSIS — N644 Mastodynia: Secondary | ICD-10-CM | POA: Insufficient documentation

## 2017-07-09 DIAGNOSIS — N611 Abscess of the breast and nipple: Secondary | ICD-10-CM | POA: Insufficient documentation

## 2017-07-09 LAB — POCT PREGNANCY, URINE: PREG TEST UR: NEGATIVE

## 2017-07-09 MED ORDER — SULFAMETHOXAZOLE-TRIMETHOPRIM 800-160 MG PO TABS: 1.0000 | ORAL_TABLET | Freq: Two times a day (BID) | ORAL | 0 refills | Status: AC

## 2017-07-09 NOTE — Discharge Instructions (Signed)
In late 2019, the [facility] will be moving to the [facility] campus. At that time, the MAU will no longer serve non-pregnant patients. We encourage you to establish care with a provider before that time, so that you can be seen with any GYN concerns, like vaginal discharge, urinary tract infection, etc.. in a timely manner. In order to make the office visit more convenient, the [facility] at [facility] will be offering evening hours from 4pm-8pm on Mondays starting 07/01/17. There will be same-day appointments, walk-in appointments and scheduled appointments available during this time.    [facility] @ [facility] [phone]  For urgent needs, [facility] is also available for management of urgent GYN complaints such as vaginal discharge.   [employer] extends eligibility for family planning services to reduce unintended pregnancies and improve the well-being of children and families.   Eligible individuals whose income is at or below 195% of the federal poverty level and who are:  - [place] citizens, documented immigrants or qualified aliens;  - Residents of [place];  - Not incarcerated; and  - Not pregnant.   [employer] Family Planning Contact Information:  Medical Assistance Clinical Section Phone: [phone] Email: [email or web address]    Skin Abscess A skin abscess is an infected area on or under your skin that contains pus and other material. An abscess can happen almost anywhere on your body. Some abscesses break open (rupture) on their own. Most continue to get worse unless they are treated. The infection can spread deeper into the body and into your blood, which can make you feel sick. Treatment usually involves draining the abscess. Follow these instructions at home: Abscess Care  If you have an abscess that has not drained, place a warm, clean, wet washcloth over the abscess several  times a day. Do this as told by your doctor.  Follow instructions from your doctor about how to take care of your abscess. Make sure you: ? Cover the abscess with a bandage (dressing). ? Change your bandage or gauze as told by your doctor. ? Wash your hands with soap and water before you change the bandage or gauze. If you cannot use soap and water, use hand sanitizer.  Check your abscess every day for signs that the infection is getting worse. Check for: ? More redness, swelling, or pain. ? More fluid or blood. ? Warmth. ? More pus or a bad smell. Medicines   Take over-the-counter and prescription medicines only as told by your doctor.  If you were prescribed an antibiotic medicine, take it as told by your doctor. Do not stop taking the antibiotic even if you start to feel better. General instructions  To avoid spreading the infection: ? Do not share personal care items, towels, or hot tubs with others. ? Avoid making skin-to-skin contact with other people.  Keep all follow-up visits as told by your doctor. This is important. Contact a doctor if:  You have more redness, swelling, or pain around your abscess.  You have more fluid or blood coming from your abscess.  Your abscess feels warm when you touch it.  You have more pus or a bad smell coming from your abscess.  You have a fever.  Your muscles ache.  You have chills.  You feel sick. Get help right away if:  You have very bad (severe) pain.  You see red streaks on your skin spreading away from the abscess.  This information is not intended to replace advice given to you by your health care provider. Make sure you discuss any questions you have with your health care provider. Document Released: 03/26/2008 Document Revised: 06/03/2016 Document Reviewed: 08/17/2015 Elsevier Interactive Patient Education  [employer].

## 2017-07-09 NOTE — MAU Note (Addendum)
PT SAYS  RIGHT BREAST  HURTS X1 WEEK -    NO DRAINAGE FROM NIPPLE.    NO MEDS  FOR PAIN

## 2017-07-09 NOTE — Progress Notes (Signed)
Opened orders only encounter to put in order for breast center.  Rachel Sanders 2:35 AM 07/09/17

## 2017-07-09 NOTE — MAU Provider Note (Signed)
  History     CSN: [id number]  Arrival date and time: 07/09/17 0136   First Provider Initiated Contact with Patient 07/09/17 0209      Chief Complaint  Patient presents with  . Breast Pain   Rachel Sanders is a 26 y.o. [id number] who presents today with right breast pain x 1 week. She rates her pain 10/10. She has not taken anything for pain. She states nothing makes it better, and nothing makes it worse. She reports that there is swelling in the area. She denies any VB. LNMP 06/14/17, regular menstrual cycles. She states that she has continued to have small amounts of breast milk from her nipples since weaning about 2 years.     Past Medical History:  Diagnosis Date  . Anemia   . Blood transfusion affecting pregnancy   . Chicken pox    as a child  . PPH (postpartum hemorrhage)    last delivery and current delivery of 2016    Past Surgical History:  Procedure Laterality Date  . NO PAST SURGERIES      Family History  Problem Relation Age of Onset  . Asthma Father   . Asthma Sister     Social History  Substance Use Topics  . Smoking status: Never Smoker  . Smokeless tobacco: Never Used  . Alcohol use No    Allergies: No Known Allergies  Prescriptions Prior to Admission  Medication Sig Dispense Refill Last Dose  . cyclobenzaprine (FLEXERIL) 10 MG tablet Take 1 tablet (10 mg total) by mouth 3 (three) times daily as needed for muscle spasms. 15 tablet 0 Taking  . PRESCRIPTION MEDICATION Birth control   Not Taking    Review of Systems  Constitutional: Negative for fever and unexpected weight change.  Gastrointestinal: Negative for nausea and vomiting.  Genitourinary: Negative for vaginal bleeding and vaginal discharge.   Physical Exam   Blood pressure 123/72, pulse 90, temperature 98.5 F (36.9 C), temperature source Oral, resp. rate 20, height  (1.651 m), weight 172 lb 12 oz (78.4 kg), last menstrual period 06/14/2017.  Physical Exam  Nursing note and vitals  reviewed. Constitutional: She is oriented to person, place, and time. She appears well-developed and well-nourished. No distress.  HENT:  Head: Normocephalic.  Cardiovascular: Normal rate.   Respiratory: Effort normal. Right breast exhibits mass (just under the skinm directly under the areola. ), skin change (redness) and tenderness. Right breast exhibits no inverted nipple (pierced nipple ) and no nipple discharge. Left breast exhibits no inverted nipple (pierced nipple ), no mass, no nipple discharge, no skin change and no tenderness.  GI: Soft. There is no tenderness. There is no rebound.  Neurological: She is alert and oriented to person, place, and time.  Skin: Skin is warm and dry.  Psychiatric: She has a normal mood and affect.    MAU Course  Procedures  MDM   Assessment and Plan   1. Breast abscess of female    DC home Comfort measures reviewed  RX: bactrim DS BID x 7 days  Return to MAU as needed FU with OB as planned  Follow-up Information    Imaging, [facility] Follow up.   Specialty:  Diagnostic Radiology Why:  They will call you for an appointment  Contact information: [address] [place] [address] [phone]            Thressa Sheller 07/09/2017, 2:25 AM

## 2017-10-22 NOTE — L&D Delivery Note (Signed)
Delivery Note At 9:47 AM a viable female was delivered via Vaginal, Spontaneous (Presentation: direct OA).  APGAR: 9, 9; weight pending.   Placenta status: delivered spontaneously and completely.  Cord: three vessel with the following complications: none.  Anesthesia:  None Episiotomy: None Lacerations: None Suture Repair: n/a Est. Blood Loss (mL):   Mom to postpartum.  Baby to Couplet care / Skin to Skin.  Janeece Riggers 07/30/2018, 10:03 AM

## 2017-12-12 LAB — OB RESULTS CONSOLE GC/CHLAMYDIA: Gonorrhea: NEGATIVE

## 2018-01-09 LAB — OB RESULTS CONSOLE HEPATITIS B SURFACE ANTIGEN: Hepatitis B Surface Ag: NEGATIVE

## 2018-01-09 LAB — OB RESULTS CONSOLE HIV ANTIBODY (ROUTINE TESTING): HIV: NONREACTIVE

## 2018-01-09 LAB — OB RESULTS CONSOLE RUBELLA ANTIBODY, IGM: RUBELLA: IMMUNE

## 2018-01-09 LAB — OB RESULTS CONSOLE RPR: RPR: NONREACTIVE

## 2018-06-29 IMAGING — DX DG CHEST 2V
2 series · 2 of 2 positions shown · non-contrast
Comparison: Chest CTA and radiographs 11/28/2016 and earlier.

CLINICAL DATA: 26-year-old female with productive cough for 4 days.
Shortness of breath and chest pain.

EXAM:
CHEST  2 VIEW

[chest pa]
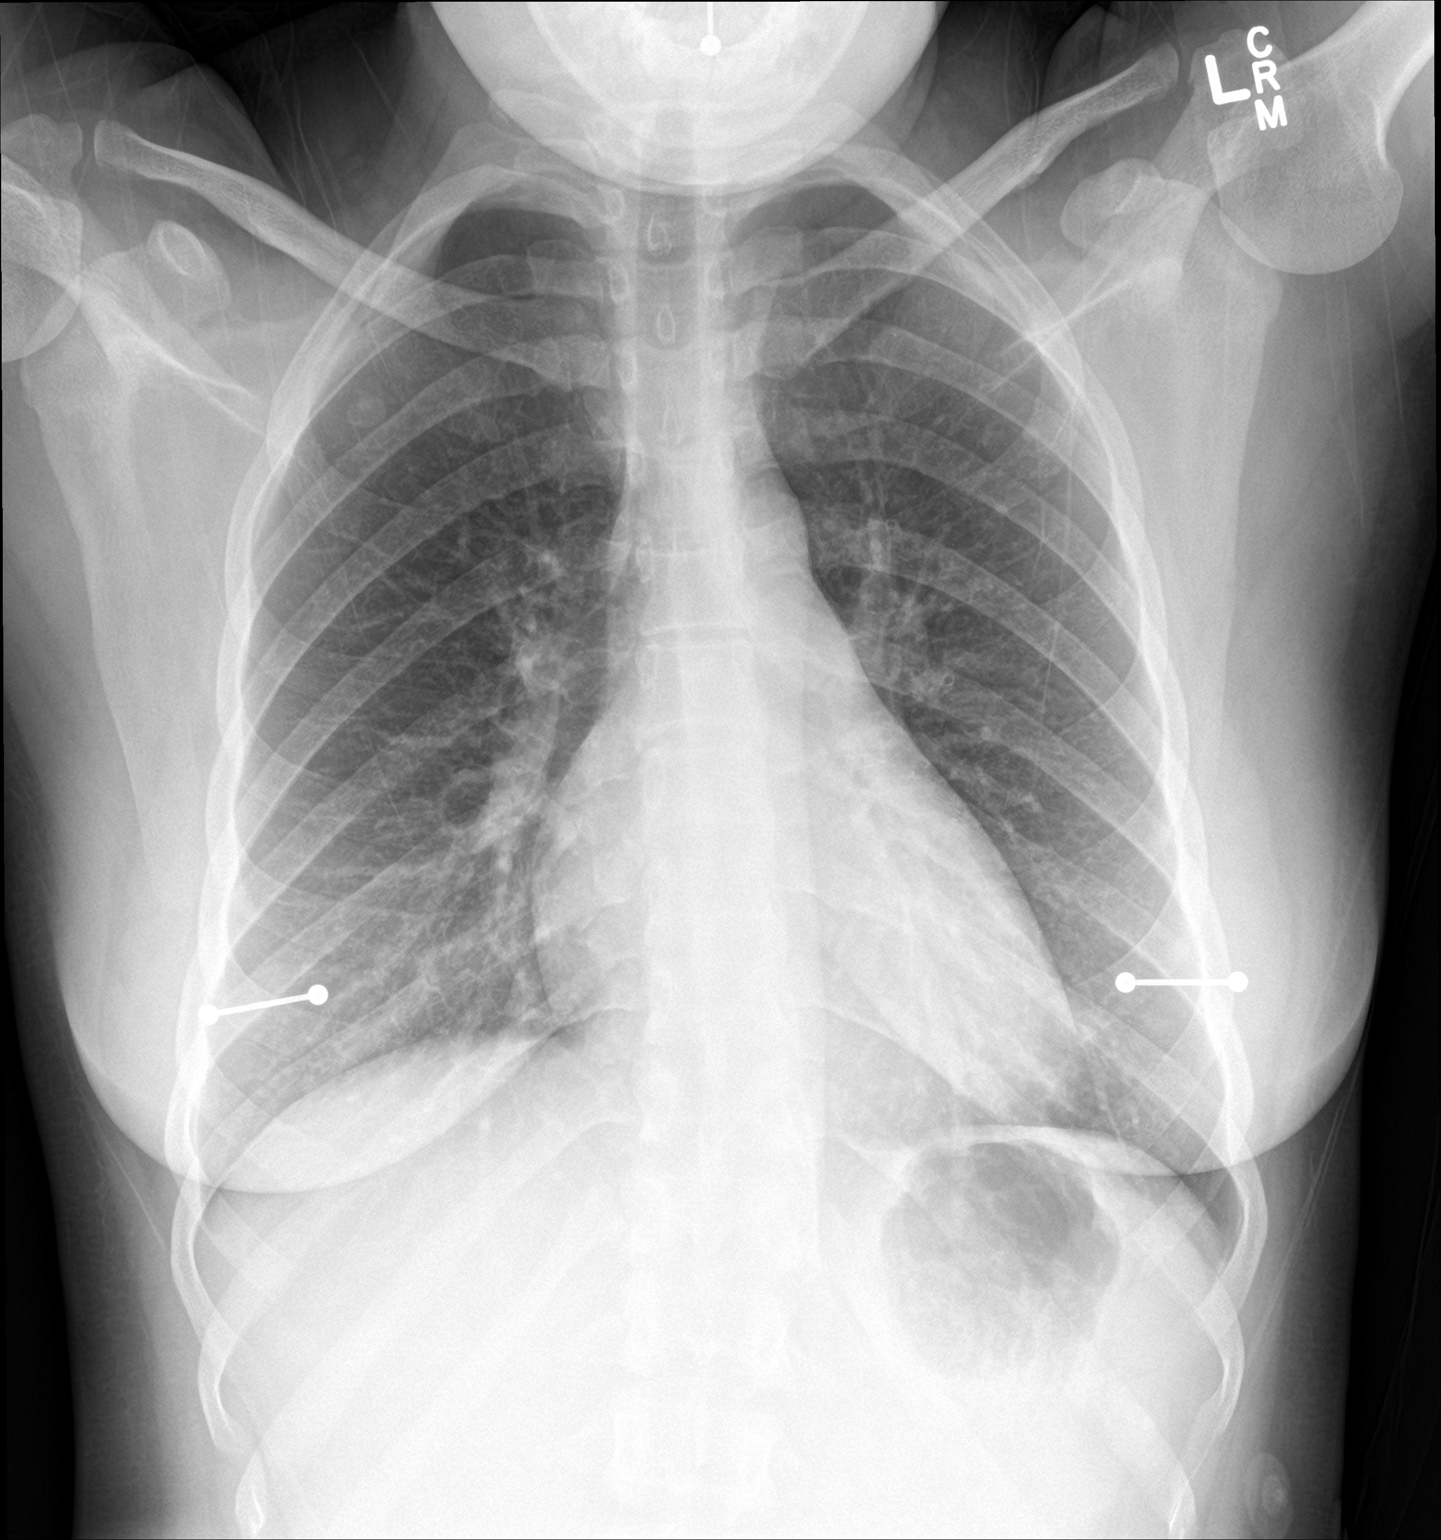

[chest lat]
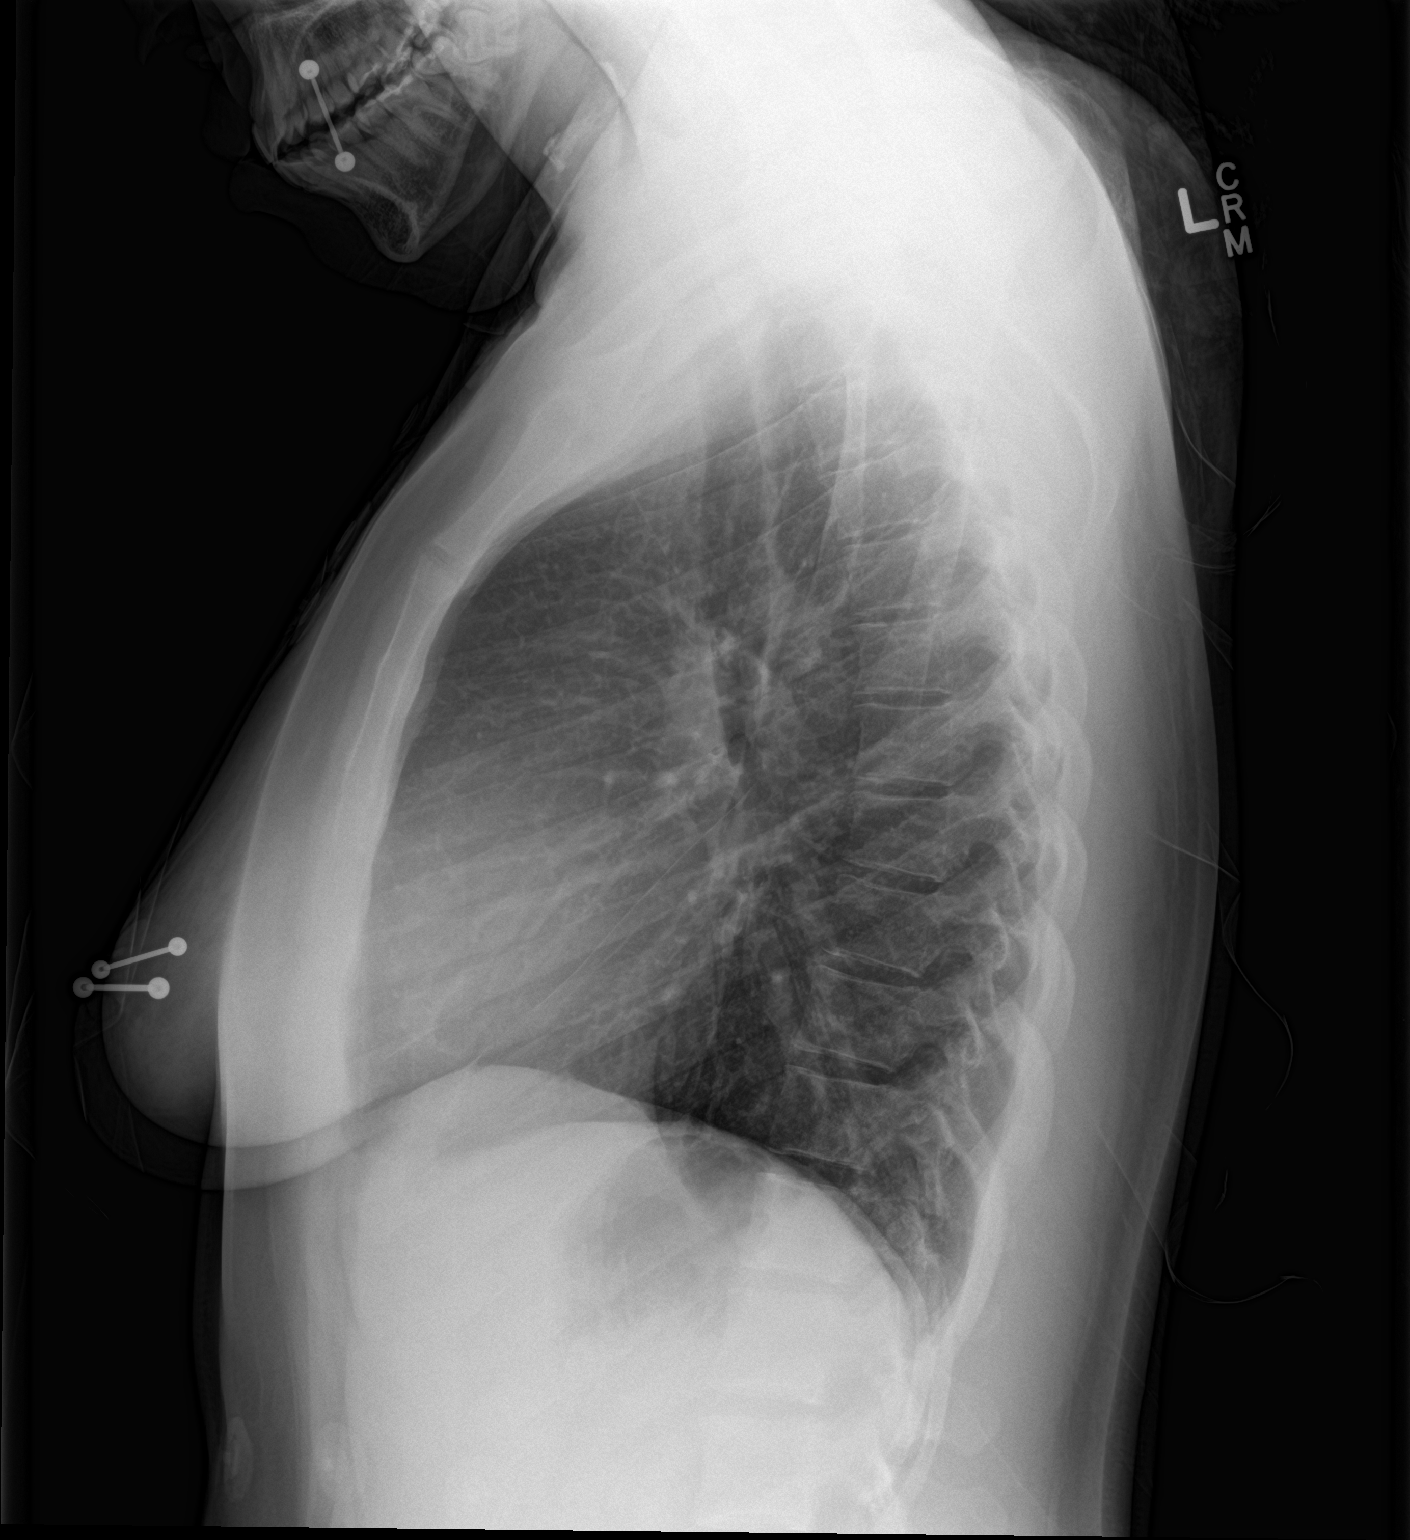

[2 of 2 positions shown; findings below may reference images not displayed]

FINDINGS: Lung volumes remain normal. Normal cardiac size and mediastinal
contours. Visualized tracheal air column is within normal limits.
EKG button artifact over the right upper lung. The lungs are clear.
No pneumothorax or pleural effusion. Negative visible bowel gas
pattern. No acute osseous abnormality identified. Incidental
multiple body piercings.
IMPRESSION: Negative.  No acute cardiopulmonary abnormality.

## 2018-07-27 ENCOUNTER — Encounter (HOSPITAL_COMMUNITY): Payer: Self-pay

## 2018-07-27 ENCOUNTER — Other Ambulatory Visit: Payer: Self-pay

## 2018-07-27 ENCOUNTER — Inpatient Hospital Stay (HOSPITAL_COMMUNITY)
Admission: AD | Admit: 2018-07-27 | Discharge: 2018-07-27 | Disposition: A | Discharge status: Home / Self Care | Payer: Medicaid Other | Source: Ambulatory Visit | Attending: Obstetrics and Gynecology | Admitting: Obstetrics and Gynecology

## 2018-07-27 DIAGNOSIS — O26893 Other specified pregnancy related conditions, third trimester: Secondary | ICD-10-CM | POA: Insufficient documentation

## 2018-07-27 DIAGNOSIS — Z3A39 39 weeks gestation of pregnancy: Secondary | ICD-10-CM | POA: Diagnosis not present

## 2018-07-27 DIAGNOSIS — O471 False labor at or after 37 completed weeks of gestation: Secondary | ICD-10-CM | POA: Diagnosis not present

## 2018-07-27 DIAGNOSIS — Z0371 Encounter for suspected problem with amniotic cavity and membrane ruled out: Secondary | ICD-10-CM

## 2018-07-27 DIAGNOSIS — N898 Other specified noninflammatory disorders of vagina: Secondary | ICD-10-CM | POA: Insufficient documentation

## 2018-07-27 LAB — AMNISURE RUPTURE OF MEMBRANE (ROM) NOT AT ARMC: Amnisure ROM: NEGATIVE

## 2018-07-27 NOTE — Discharge Instructions (Signed)
Braxton Hicks Contractions °Contractions of the uterus can occur throughout pregnancy, but they are not always a sign that you are in labor. You may have practice contractions called Braxton Hicks contractions. These false labor contractions are sometimes confused with true labor. °What are Braxton Hicks contractions? °Braxton Hicks contractions are tightening movements that occur in the muscles of the uterus before labor. Unlike true labor contractions, these contractions do not result in opening (dilation) and thinning of the cervix. Toward the end of pregnancy (32-34 weeks), Braxton Hicks contractions can happen more often and may become stronger. These contractions are sometimes difficult to tell apart from true labor because they can be very uncomfortable. You should not feel embarrassed if you go to the hospital with false labor. °Sometimes, the only way to tell if you are in true labor is for your health care provider to look for changes in the cervix. The health care provider will do a physical exam and may monitor your contractions. If you are not in true labor, the exam should show that your cervix is not dilating and your water has not broken. °If there are other health problems associated with your pregnancy, it is completely safe for you to be sent home with false labor. You may continue to have Braxton Hicks contractions until you go into true labor. °How to tell the difference between true labor and false labor °True labor °· Contractions last 30-70 seconds. °· Contractions become very regular. °· Discomfort is usually felt in the top of the uterus, and it spreads to the lower abdomen and low back. °· Contractions do not go away with walking. °· Contractions usually become more intense and increase in frequency. °· The cervix dilates and gets thinner. °False labor °· Contractions are usually shorter and not as strong as true labor contractions. °· Contractions are usually irregular. °· Contractions  are often felt in the front of the lower abdomen and in the groin. °· Contractions may go away when you walk around or change positions while lying down. °· Contractions get weaker and are shorter-lasting as time goes on. °· The cervix usually does not dilate or become thin. °Follow these instructions at home: °· Take over-the-counter and prescription medicines only as told by your health care provider. °· Keep up with your usual exercises and follow other instructions from your health care provider. °· Eat and drink lightly if you think you are going into labor. °· If Braxton Hicks contractions are making you uncomfortable: °? Change your position from lying down or resting to walking, or change from walking to resting. °? Sit and rest in a tub of warm water. °? Drink enough fluid to keep your urine pale yellow. Dehydration may cause these contractions. °? Do slow and deep breathing several times an hour. °· Keep all follow-up prenatal visits as told by your health care provider. This is important. °Contact a health care provider if: °· You have a fever. °· You have continuous pain in your abdomen. °Get help right away if: °· Your contractions become stronger, more regular, and closer together. °· You have fluid leaking or gushing from your vagina. °· You pass blood-tinged mucus (bloody show). °· You have bleeding from your vagina. °· You have low back pain that you never had before. °· You feel your baby’s head pushing down and causing pelvic pressure. °· Your baby is not moving inside you as much as it used to. °Summary °· Contractions that occur before labor are called Braxton   Hicks contractions, false labor, or practice contractions. °· Braxton Hicks contractions are usually shorter, weaker, farther apart, and less regular than true labor contractions. True labor contractions usually become progressively stronger and regular and they become more frequent. °· Manage discomfort from Braxton Hicks contractions by  changing position, resting in a warm bath, drinking plenty of water, or practicing deep breathing. °This information is not intended to replace advice given to you by your health care provider. Make sure you discuss any questions you have with your health care provider. °Document Released: 02/21/2017 Document Revised: 02/21/2017 Document Reviewed: 02/21/2017 °Elsevier Interactive Patient Education © 2018 Elsevier Inc. ° °

## 2018-07-27 NOTE — MAU Note (Signed)
Patient stated that she started leaking at 7pm yesterday, moderate amount of fluid. Patient reports that her mucous plug came out at 5am this morning. Contractions have been 5-6 minutes apart through the night and this morning.

## 2018-07-27 NOTE — MAU Note (Signed)
LOF yesterday around 1900, had a little bit more last night but not today  Was ctx every 5-6 min, continued into today  +FM

## 2018-07-27 NOTE — MAU Provider Note (Signed)
Chief Complaint  Patient presents with  . Rupture of Membranes  . Contractions     First Provider Initiated Contact with Patient 07/27/18 1335      S: Rachel Sanders  is a 27 y.o. y.o. year old G72P2002 female at [redacted]w[redacted]d weeks gestation who presents to MAU reporting leaking of clear fluid since 1900 last night. Contractions increased this morning around 0500. Cervix 1 cm in office   Contractions: Mild-mod Vaginal bleeding: denies Fetal movement: nml  O:  Patient Vitals for the past 24 hrs:  BP Temp Temp src Pulse Resp Weight  07/27/18 1206 121/71 98.2 F (36.8 C) Oral (!) 106 18 88.5 kg   General: NAD Heart: Regular rate Lungs: Normal rate and effort Abd: Soft, NT, Gravid, S=D Pelvic: NEFG, neg pooling, no blood.  Dilation: 3 Effacement (%): 50 Station: -3 Presentation: Vertex Exam by:: Lajuana Matte, RN  Recheck of cervix by CNM 3 external os, 1 cm internal os/long/-3, vtx per [place].   EFM: 145, Moderate variability, 15 x 15 accelerations, few mild variable decelerations Toco: Irreg, mild  Neg Fern Neg Amnisure  A: [redacted]w[redacted]d week IUP No evidence of SROM, No evidence of labor FHR reactive  P: Discharge home in stable condition. Labor precautions and fetal kick counts. Follow-up as scheduled for prenatal visit or sooner as needed if symptoms worsen. Return to maternity admissions as needed if symptoms worsen.  Katrinka Blazing, [place], CNM 07/27/2018 2:52 PM  2

## 2018-07-30 ENCOUNTER — Other Ambulatory Visit: Payer: Self-pay

## 2018-07-30 ENCOUNTER — Inpatient Hospital Stay (HOSPITAL_COMMUNITY)
Admission: AD | Admit: 2018-07-30 | Discharge: 2018-08-01 | DRG: 807 | Disposition: A | Discharge status: Home / Self Care | Payer: Medicaid Other | Attending: Obstetrics & Gynecology | Admitting: Obstetrics & Gynecology

## 2018-07-30 ENCOUNTER — Encounter (HOSPITAL_COMMUNITY): Payer: Self-pay

## 2018-07-30 DIAGNOSIS — Z3483 Encounter for supervision of other normal pregnancy, third trimester: Secondary | ICD-10-CM | POA: Diagnosis present

## 2018-07-30 DIAGNOSIS — Z6791 Unspecified blood type, Rh negative: Secondary | ICD-10-CM | POA: Diagnosis not present

## 2018-07-30 DIAGNOSIS — O26893 Other specified pregnancy related conditions, third trimester: Secondary | ICD-10-CM | POA: Diagnosis present

## 2018-07-30 DIAGNOSIS — Z3A39 39 weeks gestation of pregnancy: Secondary | ICD-10-CM | POA: Diagnosis not present

## 2018-07-30 DIAGNOSIS — O9081 Anemia of the puerperium: Secondary | ICD-10-CM

## 2018-07-30 DIAGNOSIS — O99824 Streptococcus B carrier state complicating childbirth: Secondary | ICD-10-CM | POA: Diagnosis present

## 2018-07-30 LAB — RPR: RPR Ser Ql: NONREACTIVE

## 2018-07-30 LAB — CBC
HCT: 32.2 % — ABNORMAL LOW (ref 36.0–46.0)
Hemoglobin: 10.7 g/dL — ABNORMAL LOW (ref 12.0–15.0)
MCH: 29.2 pg (ref 26.0–34.0)
MCHC: 33.2 g/dL (ref 30.0–36.0)
MCV: 87.7 fL (ref 80.0–100.0)
PLATELETS: 365 10*3/uL (ref 150–400)
RBC: 3.67 MIL/uL — ABNORMAL LOW (ref 3.87–5.11)
RDW: 13.2 % (ref 11.5–15.5)
WBC: 15.4 10*3/uL — ABNORMAL HIGH (ref 4.0–10.5)
nRBC: 0 % (ref 0.0–0.2)

## 2018-07-30 LAB — TYPE AND SCREEN
ABO/RH(D): A NEG
Antibody Screen: NEGATIVE

## 2018-07-30 MED ORDER — ONDANSETRON HCL 4 MG/2ML IJ SOLN: 4.0000 mg | Freq: Four times a day (QID) | INTRAMUSCULAR | Status: DC | PRN

## 2018-07-30 MED ORDER — LACTATED RINGERS IV SOLN
INTRAVENOUS | Status: DC
  Administered 2018-07-30 (×2): via INTRAVENOUS

## 2018-07-30 MED ORDER — TETANUS-DIPHTH-ACELL PERTUSSIS 5-2.5-18.5 LF-MCG/0.5 IM SUSP: 0.5000 mL | Freq: Once | INTRAMUSCULAR | Status: DC

## 2018-07-30 MED ORDER — WITCH HAZEL-GLYCERIN EX PADS: 1.0000 "application " | MEDICATED_PAD | CUTANEOUS | Status: DC | PRN

## 2018-07-30 MED ORDER — MISOPROSTOL 200 MCG PO TABS
ORAL_TABLET | ORAL | Status: AC
  Filled 2018-07-30: qty 5

## 2018-07-30 MED ORDER — ACETAMINOPHEN 325 MG PO TABS: 650.0000 mg | ORAL_TABLET | ORAL | Status: DC | PRN

## 2018-07-30 MED ORDER — ONDANSETRON HCL 4 MG/2ML IJ SOLN: 4.0000 mg | INTRAMUSCULAR | Status: DC | PRN

## 2018-07-30 MED ORDER — PRENATAL MULTIVITAMIN CH
1.0000 | ORAL_TABLET | Freq: Every day | ORAL | Status: DC
  Administered 2018-07-30 – 2018-08-01 (×2): 1 via ORAL
  Filled 2018-07-30 (×3): qty 1

## 2018-07-30 MED ORDER — ZOLPIDEM TARTRATE 5 MG PO TABS: 5.0000 mg | ORAL_TABLET | Freq: Every evening | ORAL | Status: DC | PRN

## 2018-07-30 MED ORDER — SIMETHICONE 80 MG PO CHEW: 80.0000 mg | CHEWABLE_TABLET | ORAL | Status: DC | PRN

## 2018-07-30 MED ORDER — TRANEXAMIC ACID 1000 MG/10ML IV SOLN
1000.0000 mg | Freq: Once | INTRAVENOUS | Status: DC | PRN
  Filled 2018-07-30: qty 10

## 2018-07-30 MED ORDER — LIDOCAINE HCL (PF) 1 % IJ SOLN
30.0000 mL | INTRAMUSCULAR | Status: DC | PRN
  Filled 2018-07-30: qty 30

## 2018-07-30 MED ORDER — FLEET ENEMA 7-19 GM/118ML RE ENEM: 1.0000 | ENEMA | RECTAL | Status: DC | PRN

## 2018-07-30 MED ORDER — OXYTOCIN 40 UNITS IN LACTATED RINGERS INFUSION - SIMPLE MED
2.5000 [IU]/h | INTRAVENOUS | Status: DC
  Filled 2018-07-30: qty 1000

## 2018-07-30 MED ORDER — OXYCODONE-ACETAMINOPHEN 5-325 MG PO TABS: 1.0000 | ORAL_TABLET | ORAL | Status: DC | PRN

## 2018-07-30 MED ORDER — BENZOCAINE-MENTHOL 20-0.5 % EX AERO: 1.0000 "application " | INHALATION_SPRAY | CUTANEOUS | Status: DC | PRN

## 2018-07-30 MED ORDER — ONDANSETRON HCL 4 MG PO TABS: 4.0000 mg | ORAL_TABLET | ORAL | Status: DC | PRN

## 2018-07-30 MED ORDER — DIBUCAINE 1 % RE OINT: 1.0000 "application " | TOPICAL_OINTMENT | RECTAL | Status: DC | PRN

## 2018-07-30 MED ORDER — IBUPROFEN 600 MG PO TABS
600.0000 mg | ORAL_TABLET | Freq: Four times a day (QID) | ORAL | Status: DC
  Administered 2018-07-30 – 2018-08-01 (×8): 600 mg via ORAL
  Filled 2018-07-30 (×9): qty 1

## 2018-07-30 MED ORDER — SOD CITRATE-CITRIC ACID 500-334 MG/5ML PO SOLN: 30.0000 mL | ORAL | Status: DC | PRN

## 2018-07-30 MED ORDER — LACTATED RINGERS IV SOLN
500.0000 mL | INTRAVENOUS | Status: DC | PRN
  Administered 2018-07-30: 500 mL via INTRAVENOUS

## 2018-07-30 MED ORDER — COCONUT OIL OIL: 1.0000 "application " | TOPICAL_OIL | Status: DC | PRN

## 2018-07-30 MED ORDER — ACETAMINOPHEN 325 MG PO TABS
650.0000 mg | ORAL_TABLET | ORAL | Status: DC | PRN
  Administered 2018-07-30 – 2018-07-31 (×2): 650 mg via ORAL
  Filled 2018-07-30 (×2): qty 2

## 2018-07-30 MED ORDER — FENTANYL CITRATE (PF) 100 MCG/2ML IJ SOLN
50.0000 ug | INTRAMUSCULAR | Status: DC | PRN
  Administered 2018-07-30 (×2): 100 ug via INTRAVENOUS
  Administered 2018-07-30: 50 ug via INTRAVENOUS
  Filled 2018-07-30 (×3): qty 2

## 2018-07-30 MED ORDER — OXYCODONE-ACETAMINOPHEN 5-325 MG PO TABS: 2.0000 | ORAL_TABLET | ORAL | Status: DC | PRN

## 2018-07-30 MED ORDER — OXYTOCIN BOLUS FROM INFUSION
500.0000 mL | Freq: Once | INTRAVENOUS | Status: AC
  Administered 2018-07-30: 500 mL via INTRAVENOUS

## 2018-07-30 MED ORDER — SENNOSIDES-DOCUSATE SODIUM 8.6-50 MG PO TABS
2.0000 | ORAL_TABLET | ORAL | Status: DC
  Administered 2018-07-30 – 2018-07-31 (×2): 2 via ORAL
  Filled 2018-07-30 (×2): qty 2

## 2018-07-30 MED ORDER — DIPHENHYDRAMINE HCL 25 MG PO CAPS: 25.0000 mg | ORAL_CAPSULE | Freq: Four times a day (QID) | ORAL | Status: DC | PRN

## 2018-07-30 MED ORDER — OXYTOCIN 10 UNIT/ML IJ SOLN
INTRAMUSCULAR | Status: AC
  Filled 2018-07-30: qty 1

## 2018-07-30 MED ORDER — TRANEXAMIC ACID 1000 MG/10ML IV SOLN
INTRAVENOUS | Status: AC
  Filled 2018-07-30: qty 10

## 2018-07-30 MED ORDER — SODIUM CHLORIDE 0.9 % IV SOLN
2.0000 g | Freq: Once | INTRAVENOUS | Status: AC
  Administered 2018-07-30: 2 g via INTRAVENOUS
  Filled 2018-07-30: qty 2

## 2018-07-30 MED ORDER — METHYLERGONOVINE MALEATE 0.2 MG/ML IJ SOLN
INTRAMUSCULAR | Status: AC
  Filled 2018-07-30: qty 1
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
## 2018-07-30 NOTE — MAU Note (Signed)
Pt reporting cntx since since 2000 last night. Now 2-3 mins, but now more intense. Denies LOF , scant pink bleeding. +FM

## 2018-07-30 NOTE — Anesthesia Pain Management Evaluation Note (Signed)
  CRNA Pain Management Visit Note  Patient: Rachel Sanders, 27 y.o., female  "Hello I am a member of the anesthesia team at [facility]. We have an anesthesia team available at all times to provide care throughout the hospital, including epidural management and anesthesia for C-section. I don't know your plan for the delivery whether it a natural birth, water birth, IV sedation, nitrous supplementation, doula or epidural, but we want to meet your pain goals."   1.Was your pain managed to your expectations on prior hospitalizations?   yes  2.What is your expectation for pain management during this hospitalization?     I V medications  3.How can we help you reach that goal? Nursing support and Iv pain medication  Record the patient's initial score and the patient's pain goal.   Pain: 10/10  Pain Goal: tolerates current pain [facility] wants you to be able to say your pain was always managed very well.  Salome Arnt 07/30/2018

## 2018-07-30 NOTE — Lactation Note (Signed)
This note was copied from a baby's chart. Lactation Consultation Note  Patient Name: Rachel Sanders [id number] Date: 07/30/2018 Reason for consult: Initial assessment;Term  P3 mother whose infant is now 39 hours old.  Mother breast fed her first two children for approximately one year each.  Baby was getting his hearing screen and not showing feeding cues when I arrived.  Mother stated that this baby has fed well twice since delivery.  She has no questions/concerns at this time.  She is familiar with feeding cues and hand expression.  Colostrum container provided for any EBM she may obtain with hand expression.  Encouraged to feed 8-12 times/24 hours or sooner if baby shows cues.  Feed back any EBM to baby.  Mother will call for latch assistance as needed.  Mom made aware of O/P services, breastfeeding support groups, community resources, and our phone # for post-discharge questions. Mother has had WIC in the past and wants to apply again.  She does not have a DEBP for home use but requests one.  WIC referral made.     Maternal Data Formula Feeding for Exclusion: No Has patient been taught Hand Expression?: Yes Does the patient have breastfeeding experience prior to this delivery?: Yes  Feeding Feeding Type: Breast Fed  LATCH Score                   Interventions    Lactation Tools Discussed/Used WIC Program: No(Has been in the past; wants to apply again)   Consult Status Consult Status: Follow-up Date: 07/31/18 Follow-up type: In-patient    Dora Sims 07/30/2018, 4:43 PM

## 2018-07-31 DIAGNOSIS — O9081 Anemia of the puerperium: Secondary | ICD-10-CM

## 2018-07-31 LAB — CBC
HEMATOCRIT: 29.3 % — AB (ref 36.0–46.0)
HEMOGLOBIN: 9.8 g/dL — AB (ref 12.0–15.0)
MCH: 29 pg (ref 26.0–34.0)
MCHC: 33.4 g/dL (ref 30.0–36.0)
MCV: 86.7 fL (ref 80.0–100.0)
NRBC: 0 % (ref 0.0–0.2)
Platelets: 309 10*3/uL (ref 150–400)
RBC: 3.38 MIL/uL — AB (ref 3.87–5.11)
RDW: 13.5 % (ref 11.5–15.5)
WBC: 30.7 10*3/uL — ABNORMAL HIGH (ref 4.0–10.5)

## 2018-07-31 NOTE — Progress Notes (Signed)
Subjective: Postpartum Day # 1 : S/P NSVD due to spontaneous labor. Patient up ad lib, denies syncope or dizziness. Reports consuming regular diet without issues and denies N/V. Patient reports 0 bowel movement + passing flatus.  Denies issues with urination and reports bleeding is "light."  Patient is Breastfeeding and reports going well.  Desires paraguard for postpartum contraception.  Pain is being appropriately managed with use of motrin. Pt has asymptomatic anemia with post delivery HGB of 9.8. Pt was panning on being discharge but infant needed to stay in hospital due to billi rubin elevation.    No laceration Feeding:  Breast Contraceptive plan:  Paraguard BB: Circ desires out-pt.   Objective: Vital signs in last 24 hours: Patient Vitals for the past 24 hrs:  BP Temp Temp src Pulse Resp SpO2  07/31/18 0400 117/76 97.8 F (36.6 C) Oral 93 16 99 %  07/31/18 0015 120/78 98.4 F (36.9 C) Oral 90 18 99 %  07/30/18 2025 119/71 98.4 F (36.9 C) Oral 98 18 -  07/30/18 1556 136/61 98.6 F (37 C) - 93 19 -     Physical Exam:  General: alert, cooperative, appears stated age and no distress Mood/Affect: Happy Lungs: clear to auscultation, no wheezes, rales or rhonchi, symmetric air entry.  Heart: normal rate, regular rhythm, normal S1, S2, no murmurs, rubs, clicks or gallops. Breast: breasts appear normal, no suspicious masses, no skin or nipple changes or axillary nodes. Abdomen:  + bowel sounds, soft, non-tender GU: perineum inact, healing well. No signs of external hematomas.  Uterine Fundus: firm Lochia: appropriate Skin: Warm, Dry. DVT Evaluation: No evidence of DVT seen on physical exam. Negative Homan's sign. No cords or calf tenderness. No significant calf/ankle edema.  CBC Latest Ref Rng & Units 07/31/2018 07/30/2018 03/06/2017  WBC 4.0 - 10.5 K/uL 30.7(H) 15.4(H) 5.0  Hemoglobin 12.0 - 15.0 g/dL 6.9(G) 10.7(L) 12.2  Hematocrit 36.0 - 46.0 % 29.3(L) 32.2(L) 37.7   Platelets 150 - 400 K/uL 309 365 292    Results for orders placed or performed during the hospital encounter of 07/30/18 (from the past 24 hour(s))  CBC     Status: Abnormal   Collection Time: 07/31/18  5:52 AM  Result Value Ref Range   WBC 30.7 (H) 4.0 - 10.5 K/uL   RBC 3.38 (L) 3.87 - 5.11 MIL/uL   Hemoglobin 9.8 (L) 12.0 - 15.0 g/dL   HCT 29.5 (L) 28.4 - 13.2 %   MCV 86.7 80.0 - 100.0 fL   MCH 29.0 26.0 - 34.0 pg   MCHC 33.4 30.0 - 36.0 g/dL   RDW 44.0 10.2 - 72.5 %   Platelets 309 150 - 400 K/uL   nRBC 0.0 0.0 - 0.2 %     CBG (last 3)  No results for input(s): GLUCAP in the last 72 hours.   I/O last 3 completed shifts: In: 689.5 [I.V.:589.5; IV Piggyback:100] Out: 242 [Blood:242]   Assessment Postpartum Day # 1 : S/P NSVD due to spontaneous labor. Pt stable. -2 involution. Breastfeeding. Hemodynamically stable. HGB 9.7 on iron.    Plan: Continue other mgmt as ordered VTE prophylactics: Early ambulated as tolerates.  Pain control: Motrin/Tylenol PRN Anemia: Pt to start PO iron today.  Education given regarding options for contraception, including IUD placement.  Plan for discharge tomorrow, Breastfeeding, Lactation consult and Contraception paraguard IUD. Pt desires out-pt circ.  Dr. Normand Sloop to be updated on patient status  Paislea Hatton NP-C, CNM 07/31/2018, 2:46 PM
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
## 2018-07-31 NOTE — Lactation Note (Signed)
This note was copied from a baby's chart. Lactation Consultation Note  Patient Name: Boy Kalany Diekmann ZOXWR'U Date: 07/31/2018   Baby asleep in bassinet.  Mom states infant is feeding well.  She has breastfed her 2 previous children ages 51 and 3.    BF basics reviewed and LC emphasized importance of good head/neck support for newborn.  Mom says she was sore with first child due to shallow latch but states she is aware to get her newborn latched deeply in order to decrease soreness.  Mom requests hand pump so LC provided mom with one.  Mom declined review or instructions for pump/milk storage.  Support group info shared with mom and LC encouraged her to call out for further questions or concerns.     Maternal Data    Feeding    LATCH Score                   Interventions    Lactation Tools Discussed/Used     Consult Status      Maryruth Hancock [facility] 07/31/2018, 1:47 PM

## 2018-08-01 MED ORDER — DOCUSATE SODIUM 100 MG PO CAPS: 100.0000 mg | ORAL_CAPSULE | Freq: Every day | ORAL | 2 refills | Status: AC | PRN

## 2018-08-01 MED ORDER — FERROUS SULFATE 325 (65 FE) MG PO TABS: 325.0000 mg | ORAL_TABLET | Freq: Every day | ORAL | 3 refills | Status: DC

## 2018-08-01 NOTE — Lactation Note (Addendum)
This note was copied from a baby's chart. Lactation Consultation Note  Patient Name: Rachel Sanders [id number] Date: 08/01/2018 Reason for consult: Follow-up assessment;Infant weight loss;Term(7% weight loss ) Baby is 37 hours old  As LC entered the room , baby latched fully dressed, depth noted, per mom comfortable.  LC added pillow support and increased swallows noted, FISH lips. Per mom breast are fuller and heavier today. By the sounds of the multiple swallows, milk volume is increasing. LC praised mom for her efforts breast feeding. Mom denies soreness.  LC reviewed doc flow sheets- 5 wets in the last 24 hours/ in the life =8, 2 stools in the last 24 hours, and in life 4. Per mom last one was thinning and brown. 43 hours Serum Bili 8.7 L/I. Baby has been consistent to the breast  With latch score 8-10. After the baby fed . LC set mom up with a DEBP for post pumping were [facility] / per Dr. Karilyn Cota .  The #24 F was comfortable both breast. LC explained the initiation mode.  Mom denies soreness. Sore nipples and engorgement prevention and tx reviewed.  LC stressed the importance of STS feedings until the baby can stay awake for a feeding, back to birth weight, and gaining well.    F/U after mom pumped both breast for 20 mins , and EBM was 15 ml.  Baby is sound asleep in the crib . Mom aware the baby will need supplemented after feeding.  ( LC informed mom there are options to supplement at the breast with SNS)   MBURN aware of the EBM yield of 15 ml. Milk volume increasing.      Maternal Data Has patient been taught Hand Expression?: Yes  Feeding Feeding Type: (baby latched with depth /per mom comfortable )  LATCH Score Latch: (latched with depth )                 Interventions Interventions: Breast feeding basics reviewed  Lactation Tools Discussed/Used Tools: Pump Breast pump type: Manual Pump Review: Milk Storage Initiated by:: MAI  Date initiated::  08/01/18   Consult Status Consult Status: Complete Date: 08/01/18    Kathrin Greathouse 08/01/2018, 9:19 AM
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
## 2018-08-02 ENCOUNTER — Ambulatory Visit: Payer: Self-pay

## 2018-08-02 NOTE — Lactation Note (Signed)
This note was copied from a baby's chart. Lactation Consultation Note  Patient Name: Rachel Sanders [id number] Date: 08/02/2018 Reason for consult: Follow-up assessment;Other (Comment);Infant weight loss(weight gain since yesterday - now 4 % loss , gained 3 oz / milk is coming to volume / not engorged / at 62 hours Bili 10 ( Low risk ). )  Baby is 58 hours old,  As LC entered the room , baby asleep, mom in the  [place]ough, and 2 young siblings in room , mom awake and one looking at I- pad.  LC informed mom baby had gained 3 oz since yesterday and praised her for her efforts breast feeding and pumping. Per mom milk is in, not engorged, just knots that I massage and they despair. Mom denies sore nipples. Sore nipple and engorgement reviewed.  LC instructed mom on the use hand pump. LC discussed with mom to enhance weight gain and give the baby more volume ( fatty milk ) if she is really full to express some milk off the 1st breast with hand expressing or hand pump so the baby will get to the creamy fatty milk quicker and will enhance stooling.  LC confirmed with mom the last stool was yesterday 5:30 am. Per mom baby is passing a lot of gas.  LC recommended every feeding STS feedings, and to offer both breast after the baby softens 1st breast well.  Mother informed of post-discharge support and given phone number to the lactation department, including services for phone call assistance; out-patient appointments; and breastfeeding support group. List of other breastfeeding resources in the community given in the handout. Encouraged mother to call for problems or concerns related to breastfeeding.   Maternal Data Has patient been taught Hand Expression?: Yes  Feeding Feeding Type: (per mom last fed at 0700)  LATCH Score                   Interventions Interventions: Breast feeding basics reviewed;Hand pump  Lactation Tools Discussed/Used     Consult Status Consult Status:  Complete Date: 08/02/18    Matilde Sprang [place] 08/02/2018, 9:18 AM

## 2018-11-26 ENCOUNTER — Ambulatory Visit: Payer: Medicaid Other | Admitting: Neurology

## 2018-11-26 ENCOUNTER — Encounter: Payer: Self-pay | Admitting: *Deleted

## 2018-11-27 ENCOUNTER — Encounter: Payer: Self-pay | Admitting: Neurology

## 2018-12-07 ENCOUNTER — Inpatient Hospital Stay (HOSPITAL_COMMUNITY)
Admission: AD | Admit: 2018-12-07 | Discharge: 2018-12-07 | Disposition: A | Discharge status: Home / Self Care | Payer: Medicaid Other | Source: Ambulatory Visit | Attending: Obstetrics & Gynecology | Admitting: Obstetrics & Gynecology

## 2018-12-07 ENCOUNTER — Encounter (HOSPITAL_COMMUNITY): Payer: Self-pay

## 2018-12-07 DIAGNOSIS — N760 Acute vaginitis: Secondary | ICD-10-CM | POA: Insufficient documentation

## 2018-12-07 DIAGNOSIS — B9689 Other specified bacterial agents as the cause of diseases classified elsewhere: Secondary | ICD-10-CM | POA: Insufficient documentation

## 2018-12-07 DIAGNOSIS — R102 Pelvic and perineal pain: Secondary | ICD-10-CM | POA: Insufficient documentation

## 2018-12-07 DIAGNOSIS — A6004 Herpesviral vulvovaginitis: Secondary | ICD-10-CM

## 2018-12-07 LAB — URINALYSIS, MICROSCOPIC (REFLEX)

## 2018-12-07 LAB — URINALYSIS, ROUTINE W REFLEX MICROSCOPIC
BILIRUBIN URINE: NEGATIVE
GLUCOSE, UA: NEGATIVE mg/dL
HGB URINE DIPSTICK: NEGATIVE
Ketones, ur: NEGATIVE mg/dL
Nitrite: NEGATIVE
PROTEIN: NEGATIVE mg/dL
pH: 6.5 (ref 5.0–8.0)

## 2018-12-07 LAB — WET PREP, GENITAL
Sperm: NONE SEEN
Trich, Wet Prep: NONE SEEN
YEAST WET PREP: NONE SEEN

## 2018-12-07 MED ORDER — METRONIDAZOLE 500 MG PO TABS: 500.0000 mg | ORAL_TABLET | Freq: Two times a day (BID) | ORAL | 0 refills | Status: DC

## 2018-12-07 MED ORDER — LIDOCAINE HCL URETHRAL/MUCOSAL 2 % EX GEL
1.0000 "application " | Freq: Once | CUTANEOUS | Status: AC
  Administered 2018-12-07: 1 via TOPICAL
  Filled 2018-12-07: qty 5

## 2018-12-07 MED ORDER — VALACYCLOVIR HCL 1 G PO TABS: 1000.0000 mg | ORAL_TABLET | Freq: Two times a day (BID) | ORAL | 0 refills | Status: AC

## 2018-12-07 NOTE — MAU Provider Note (Signed)
History     CSN: [id number]  Arrival date and time: 12/07/18 9528   First Provider Initiated Contact with Patient 12/07/18 2018      Chief Complaint  Patient presents with  . Vaginal Pain   HPI Rachel Sanders is a 28 y.o. G3P3003 non pregnant female who presents with vaginal pain. She states she noticed yesterday that she was having pain in her vagina with urination. She states she used a [employer] to look and noticed white spots in her vagina. She denies any discharge or vaginal bleeding. She reports one partner for the last 8 years. She was recently seen at [facility] for her annual and all STD testing was negative. She is requesting repeat testing today.   OB History    Gravida  3   Para  3   Term  3   Preterm  0   AB  0   Living  3     SAB  0   TAB  0   Ectopic  0   Multiple  0   Live Births  3           Past Medical History:  Diagnosis Date  . Anemia   . Blood transfusion affecting pregnancy   . Chicken pox    as a child  . PPH (postpartum hemorrhage)    last delivery and current delivery of 2016    Past Surgical History:  Procedure Laterality Date  . NO PAST SURGERIES      Family History  Problem Relation Age of Onset  . Asthma Father   . Asthma Sister     Social History   Tobacco Use  . Smoking status: Never Smoker  . Smokeless tobacco: Never Used  Substance Use Topics  . Alcohol use: No    Alcohol/week: 0.0 standard drinks  . Drug use: No    Allergies: No Known Allergies  Medications Prior to Admission  Medication Sig Dispense Refill Last Dose  . Prenatal Vit-Fe Fumarate-FA (PRENATAL MULTIVITAMIN) TABS tablet Take 1 tablet by mouth daily at 12 noon.   12/07/2018 at Unknown time  . Butalbital-APAP-Caffeine (FIORICET) 50-300-40 MG CAPS Take 1 capsule by mouth every 4 (four) hours as needed.     . docusate sodium (COLACE) 100 MG capsule Take 1 capsule (100 mg total) by mouth daily as needed. 30 capsule 2 Unknown at Unknown time  . ferrous  sulfate 325 (65 FE) MG tablet Take 1 tablet (325 mg total) by mouth daily. 30 tablet 3     Review of Systems  Constitutional: Negative.  Negative for fatigue and fever.  HENT: Negative.   Respiratory: Negative.  Negative for shortness of breath.   Cardiovascular: Negative.  Negative for chest pain.  Gastrointestinal: Negative.  Negative for abdominal pain, constipation, diarrhea, nausea and vomiting.  Genitourinary: Positive for vaginal pain. Negative for dysuria.  Neurological: Negative.  Negative for dizziness and headaches.   Physical Exam   Blood pressure 135/80, pulse 93, temperature 98.5 F (36.9 C), temperature source Oral, resp. rate 16, height 5\' 5"  (1.651 m), weight 85.3 kg, last menstrual period 11/10/2017, unknown if currently breastfeeding.  Physical Exam  Nursing note and vitals reviewed. Constitutional: She is oriented to person, place, and time. She appears well-developed and well-nourished. No distress.  HENT:  Head: Normocephalic.  Eyes: Pupils are equal, round, and reactive to light.  Cardiovascular: Normal rate, regular rhythm and normal heart sounds.  Respiratory: Effort normal and breath sounds normal. No respiratory distress.  GI: Soft. Bowel sounds are normal. She exhibits no distension. There is no abdominal tenderness.  Genitourinary:    Cervix exhibits friability.    Vaginal discharge and tenderness present.  There is tenderness in the vagina.       Genitourinary Comments: Many white patches along bottom of introitus and above urethra. Numerous white patches around cervical os. Copious thin grey discharge.    Neurological: She is alert and oriented to person, place, and time.  Skin: Skin is warm and dry.  Psychiatric: She has a normal mood and affect. Her behavior is normal. Judgment and thought content normal.    MAU Course  Procedures Results for orders placed or performed during the hospital encounter of 12/07/18 (from the past 24 hour(s))    Urinalysis, Routine w reflex microscopic     Status: Abnormal   Collection Time: 12/07/18  7:40 PM  Result Value Ref Range   Color, Urine YELLOW YELLOW   APPearance CLEAR CLEAR   Specific Gravity, Urine >1.030 (H) 1.005 - 1.030   pH 6.5 5.0 - 8.0   Glucose, UA NEGATIVE NEGATIVE mg/dL   Hgb urine dipstick NEGATIVE NEGATIVE   Bilirubin Urine NEGATIVE NEGATIVE   Ketones, ur NEGATIVE NEGATIVE mg/dL   Protein, ur NEGATIVE NEGATIVE mg/dL   Nitrite NEGATIVE NEGATIVE   Leukocytes,Ua TRACE (A) NEGATIVE  Urinalysis, Microscopic (reflex)     Status: Abnormal   Collection Time: 12/07/18  7:40 PM  Result Value Ref Range   RBC / HPF 0-5 0 - 5 RBC/hpf   WBC, UA 0-5 0 - 5 WBC/hpf   Bacteria, UA FEW (A) NONE SEEN   Squamous Epithelial / LPF 0-5 0 - 5  Wet prep, genital     Status: Abnormal   Collection Time: 12/07/18  8:35 PM  Result Value Ref Range   Yeast Wet Prep HPF POC NONE SEEN NONE SEEN   Trich, Wet Prep NONE SEEN NONE SEEN   Clue Cells Wet Prep HPF POC PRESENT (A) NONE SEEN   WBC, Wet Prep HPF POC MODERATE (A) NONE SEEN   Sperm NONE SEEN    MDM UA, UPT Wet prep and gc/chlamydia HSV culture RPR, HIV, Hep B, Hep C Lidocaine gel  Lengthy discussion with patient of classic presentation of HSV and likely a primary outbreak. Recommend presumptive treatment to prevent spread while waiting on results of PCR. Patient agreeable to plan of care.   Assessment and Plan   1. 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 Vaginal pain   3. Bacterial vaginosis    -Discharge home in stable condition -Rx for metronidazole and valacyclovir given to patient.  -HSV precautions discussed -Patient advised to follow-up with [facility] as needed for gyn needs -Patient may return to MAU as needed or if her condition were to change or worsen   Rolm Bookbinder CNM 12/07/2018, 8:18 PM

## 2018-12-07 NOTE — Discharge Instructions (Signed)
Genital Herpes °Genital herpes is a common sexually transmitted infection (STI) that is caused by a virus. The virus spreads from person to person through sexual contact. Infection can cause itching, blisters, and sores around the genitals or rectum. Symptoms may last several days and then go away This is called an outbreak. However, the virus remains in your body, so you may have more outbreaks in the future. The time between outbreaks varies and can be months or years. °Genital herpes affects men and women. It is particularly concerning for pregnant women because the virus can be passed to the baby during delivery and can cause serious problems. Genital herpes is also a concern for people who have a weak disease-fighting (immune) system. °What are the causes? °This condition is caused by the herpes simplex virus (HSV) type 1 or type 2. The virus may spread through: °· Sexual contact with an infected person, including vaginal, anal, and oral sex. °· Contact with fluid from a herpes sore. °· The skin. This means that you can get herpes from an infected partner even if he or she does not have a visible sore or does not know that he or she is infected. °What increases the risk? °You are more likely to develop this condition if: °· You have sex with many partners. °· You do not use latex condoms during sex. °What are the signs or symptoms? °Most people do not have symptoms (asymptomatic) or have mild symptoms that may be mistaken for other skin problems. Symptoms may include: °· Small red bumps near the genitals, rectum, or mouth. These bumps turn into blisters and then turn into sores. °· Flu-like symptoms, including: °? Fever. °? Body aches. °? Swollen lymph nodes. °? Headache. °· Painful urination. °· Pain and itching in the genital area or rectal area. °· Vaginal discharge. °· Tingling or shooting pain in the legs and buttocks. °Generally, symptoms are more severe and last longer during the first (primary)  outbreak. Flu-like symptoms are also more common during the primary outbreak. °How is this diagnosed? °Genital herpes may be diagnosed based on: °· A physical exam. °· Your medical history. °· Blood tests. °· A test of a fluid sample (culture) from an open sore. °How is this treated? °There is no cure for this condition, but treatment with antiviral medicines that are taken by mouth (orally) can do the following: °· Speed up healing and relieve symptoms. °· Help to reduce the spread of the virus to sexual partners. °· Limit the chance of future outbreaks, or make future outbreaks shorter. °· Lessen symptoms of future outbreaks. °Your health care provider may also recommend pain relief medicines, such as aspirin or ibuprofen. °Follow these instructions at home: °Sexual activity °· Do not have sexual contact during active outbreaks. °· Practice safe sex. Latex condoms and female condoms may help prevent the spread of the herpes virus. °General instructions °· Keep the affected areas dry and clean. °· Take over-the-counter and prescription medicines only as told by your health care provider. °· Avoid rubbing or touching blisters and sores. If you do touch blisters or sores: °? Wash your hands thoroughly with soap and water. °? Do not touch your eyes afterward. °· To help relieve pain or itching, you may take the following actions as directed by your health care provider: °? Apply a cold, wet cloth (cold compress) to affected areas 4-6 times a day. °? Apply a substance that protects your skin and reduces bleeding (astringent). °? Apply a gel that   helps relieve pain around sores (lidocaine gel). °? Take a warm, shallow bath that cleans the genital area (sitz bath). °· Keep all follow-up visits as told by your health care provider. This is important. °How is this prevented? °· Use condoms. Although anyone can get genital herpes during sexual contact, even with the use of a condom, a condom can provide some  protection. °· Avoid having multiple sexual partners. °· Talk with your sexual partner about any symptoms either of you may have. Also, talk with your partner about any history of STIs. °· Get tested for STIs before you have sex. Ask your partner to do the same. °· Do not have sexual contact if you have symptoms of genital herpes. °Contact a health care provider if: °· Your symptoms are not improving with medicine. °· Your symptoms return. °· You have new symptoms. °· You have a fever. °· You have abdominal pain. °· You have redness, swelling, or pain in your eye. °· You notice new sores on other parts of your body. °· You are a woman and experience bleeding between menstrual periods. °· You have had herpes and you become pregnant or plan to become pregnant. °Summary °· Genital herpes is a common sexually transmitted infection (STI) that is caused by the herpes simplex virus (HSV) type 1 or type 2. °· These viruses are most often spread through sexual contact with an infected person. °· You are more likely to develop this condition if you have sex with many partners or you have unprotected sex. °· Most people do not have symptoms (asymptomatic) or have mild symptoms that may be mistaken for other skin problems. Symptoms occur as outbreaks that may happen months or years apart. °· There is no cure for this condition, but treatment with oral antiviral medicines can reduce symptoms, reduce the chance of spreading the virus to a partner, prevent future outbreaks, or shorten future outbreaks. °This information is not intended to replace advice given to you by your health care provider. Make sure you discuss any questions you have with your health care provider. °Document Released: 10/05/2000 Document Revised: 09/07/2016 Document Reviewed: 09/07/2016 °Elsevier Interactive Patient Education © 2019 [employer] ° °

## 2018-12-07 NOTE — MAU Note (Signed)
Pt c/o of stinging when she urinates since yesterday. Clear discharge, no odor. No frequency or urgency. Also seeing some white spots inside her vaginal. Had sex 4-5 days ago.

## 2018-12-08 LAB — HEPATITIS B SURFACE ANTIGEN: Hepatitis B Surface Ag: NEGATIVE

## 2018-12-08 LAB — GC/CHLAMYDIA PROBE AMP (~~LOC~~) NOT AT ARMC
CHLAMYDIA, DNA PROBE: NEGATIVE
Neisseria Gonorrhea: NEGATIVE

## 2018-12-08 LAB — RPR: RPR: NONREACTIVE

## 2018-12-08 LAB — HIV ANTIBODY (ROUTINE TESTING W REFLEX): HIV SCREEN 4TH GENERATION: NONREACTIVE

## 2018-12-09 ENCOUNTER — Telehealth: Payer: Self-pay

## 2018-12-09 LAB — HERPES SIMPLEX VIRUS(HSV) DNA BY PCR
HSV 1 DNA: NEGATIVE
HSV 2 DNA: POSITIVE — AB

## 2018-12-09 LAB — HEPATITIS C ANTIBODY: HCV Ab: <0.1 s/co ratio (ref 0.0–0.9)

## 2018-12-09 NOTE — Telephone Encounter (Signed)
Pt called stating that she was seen at [facility] on the 16th and was prescribed a medication that was not covered by Medicaid.  Pt asked if she would be able to get a discount if coming to our office?

## 2018-12-10 ENCOUNTER — Telehealth (HOSPITAL_COMMUNITY): Payer: Self-pay

## 2018-12-10 NOTE — Telephone Encounter (Signed)
Called pt and pt informed me that someone called her and fixed her concern.  Pt did not have any questions.

## 2019-06-03 ENCOUNTER — Emergency Department (HOSPITAL_COMMUNITY)
Admission: EM | Admit: 2019-06-03 | Discharge: 2019-06-03 | Disposition: A | Discharge status: Home / Self Care | Payer: Self-pay | Attending: Emergency Medicine | Admitting: Emergency Medicine

## 2019-06-03 ENCOUNTER — Other Ambulatory Visit: Payer: Self-pay

## 2019-06-03 DIAGNOSIS — Y92000 Kitchen of unspecified non-institutional (private) residence as  the place of occurrence of the external cause: Secondary | ICD-10-CM | POA: Insufficient documentation

## 2019-06-03 DIAGNOSIS — W269XXA Contact with unspecified sharp object(s), initial encounter: Secondary | ICD-10-CM | POA: Insufficient documentation

## 2019-06-03 DIAGNOSIS — Y999 Unspecified external cause status: Secondary | ICD-10-CM | POA: Insufficient documentation

## 2019-06-03 DIAGNOSIS — Y93G1 Activity, food preparation and clean up: Secondary | ICD-10-CM | POA: Insufficient documentation

## 2019-06-03 DIAGNOSIS — S61213A Laceration without foreign body of left middle finger without damage to nail, initial encounter: Secondary | ICD-10-CM | POA: Insufficient documentation

## 2019-06-03 DIAGNOSIS — Z79899 Other long term (current) drug therapy: Secondary | ICD-10-CM | POA: Insufficient documentation

## 2019-06-03 MED ORDER — TETANUS-DIPHTH-ACELL PERTUSSIS 5-2.5-18.5 LF-MCG/0.5 IM SUSP: 0.5000 mL | Freq: Once | INTRAMUSCULAR | Status: DC
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
## 2019-06-03 NOTE — Discharge Instructions (Signed)
Please read attached information. If you experience any new or worsening signs or symptoms please return to the emergency room for evaluation. Please follow-up with your primary care provider or specialist as discussed.  °

## 2019-06-03 NOTE — ED Triage Notes (Signed)
Pt has small lac to left middle finger that she got last night she states ' maybe from a knife but maybe form glass". No bleeding covered with small bandage.

## 2019-12-25 ENCOUNTER — Encounter (HOSPITAL_COMMUNITY): Payer: Self-pay | Admitting: Emergency Medicine

## 2019-12-25 ENCOUNTER — Other Ambulatory Visit: Payer: Self-pay

## 2019-12-25 ENCOUNTER — Ambulatory Visit (HOSPITAL_COMMUNITY)
Admission: EM | Admit: 2019-12-25 | Discharge: 2019-12-25 | Disposition: A | Discharge status: Home / Self Care | Payer: BC Managed Care – PPO | Attending: Family Medicine | Admitting: Family Medicine

## 2019-12-25 DIAGNOSIS — M545 Low back pain, unspecified: Secondary | ICD-10-CM

## 2019-12-25 DIAGNOSIS — Z20822 Contact with and (suspected) exposure to covid-19: Secondary | ICD-10-CM | POA: Diagnosis not present

## 2019-12-25 DIAGNOSIS — Z79899 Other long term (current) drug therapy: Secondary | ICD-10-CM | POA: Insufficient documentation

## 2019-12-25 DIAGNOSIS — D649 Anemia, unspecified: Secondary | ICD-10-CM | POA: Diagnosis not present

## 2019-12-25 DIAGNOSIS — R112 Nausea with vomiting, unspecified: Secondary | ICD-10-CM | POA: Diagnosis present

## 2019-12-25 DIAGNOSIS — R1084 Generalized abdominal pain: Secondary | ICD-10-CM | POA: Insufficient documentation

## 2019-12-25 DIAGNOSIS — Z3202 Encounter for pregnancy test, result negative: Secondary | ICD-10-CM | POA: Diagnosis not present

## 2019-12-25 LAB — POCT URINALYSIS DIP (DEVICE)
Bilirubin Urine: NEGATIVE
Glucose, UA: NEGATIVE mg/dL
Hgb urine dipstick: NEGATIVE
Ketones, ur: NEGATIVE mg/dL
Nitrite: NEGATIVE
Protein, ur: NEGATIVE mg/dL
Specific Gravity, Urine: 1.02 (ref 1.005–1.030)
Urobilinogen, UA: 0.2 mg/dL (ref 0.0–1.0)
pH: 7 (ref 5.0–8.0)

## 2019-12-25 LAB — POC URINE PREG, ED: Preg Test, Ur: NEGATIVE

## 2019-12-25 LAB — POCT PREGNANCY, URINE: Preg Test, Ur: NEGATIVE

## 2019-12-25 MED ORDER — CEPHALEXIN 500 MG PO CAPS: 500.0000 mg | ORAL_CAPSULE | Freq: Two times a day (BID) | ORAL | 0 refills | Status: AC

## 2019-12-25 MED ORDER — ONDANSETRON 4 MG PO TBDP: 4.0000 mg | ORAL_TABLET | Freq: Three times a day (TID) | ORAL | 0 refills | Status: DC | PRN

## 2019-12-25 MED ORDER — IBUPROFEN 600 MG PO TABS: 600.0000 mg | ORAL_TABLET | Freq: Four times a day (QID) | ORAL | 0 refills | Status: DC | PRN

## 2019-12-25 NOTE — Discharge Instructions (Signed)
COVID swab pending, monitor my chart for results Urine showed signs of possible UTI, urine culture pending for confirmation Begin Keflex twice daily for the next week to treat UTI Zofran as needed for nausea/vomiting every 8 hours Please drink plenty of fluids, slowly transition diet from blander foods, avoid spicy and greasy temporarily May use Tylenol/ibuprofen as needed for back pain  Please follow-up if any of your symptoms are not improving or worsening, developing increased abdominal pain, lightheadedness, dizziness, still not keeping food down with use of Zofran

## 2019-12-25 NOTE — ED Triage Notes (Signed)
Pt here for lower back pain and nausea x 3 days; pt sts she has mirena and does not usually have a period
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
## 2019-12-26 LAB — NOVEL CORONAVIRUS, NAA (HOSP ORDER, SEND-OUT TO REF LAB; TAT 18-24 HRS): SARS-CoV-2, NAA: NOT DETECTED

## 2019-12-27 ENCOUNTER — Encounter (HOSPITAL_COMMUNITY): Payer: Self-pay

## 2019-12-27 ENCOUNTER — Emergency Department (HOSPITAL_COMMUNITY): Payer: BC Managed Care – PPO

## 2019-12-27 ENCOUNTER — Emergency Department (HOSPITAL_COMMUNITY)
Admission: EM | Admit: 2019-12-27 | Discharge: 2019-12-27 | Disposition: A | Discharge status: Home / Self Care | Payer: BC Managed Care – PPO | Attending: Emergency Medicine | Admitting: Emergency Medicine

## 2019-12-27 ENCOUNTER — Other Ambulatory Visit: Payer: Self-pay

## 2019-12-27 DIAGNOSIS — R1110 Vomiting, unspecified: Secondary | ICD-10-CM | POA: Insufficient documentation

## 2019-12-27 DIAGNOSIS — R1010 Upper abdominal pain, unspecified: Secondary | ICD-10-CM

## 2019-12-27 LAB — URINE CULTURE: Culture: 20000 — AB

## 2019-12-27 LAB — I-STAT BETA HCG BLOOD, ED (MC, WL, AP ONLY): I-stat hCG, quantitative: <5 m[IU]/mL (ref <5)

## 2019-12-27 LAB — COMPREHENSIVE METABOLIC PANEL
ALT: 14 U/L (ref 0–44)
AST: 13 U/L — ABNORMAL LOW (ref 15–41)
Albumin: 4.1 g/dL (ref 3.5–5.0)
Alkaline Phosphatase: 63 U/L (ref 38–126)
Anion gap: 9 (ref 5–15)
BUN: 5 mg/dL — ABNORMAL LOW (ref 6–20)
CO2: 21 mmol/L — ABNORMAL LOW (ref 22–32)
Calcium: 9.2 mg/dL (ref 8.9–10.3)
Chloride: 106 mmol/L (ref 98–111)
Creatinine, Ser: 0.69 mg/dL (ref 0.44–1.00)
GFR calc Af Amer: >60 mL/min (ref >60)
GFR calc non Af Amer: >60 mL/min (ref >60)
Glucose, Bld: 102 mg/dL — ABNORMAL HIGH (ref 70–99)
Potassium: 3.9 mmol/L (ref 3.5–5.1)
Sodium: 136 mmol/L (ref 135–145)
Total Bilirubin: 0.9 mg/dL (ref 0.3–1.2)
Total Protein: 6.8 g/dL (ref 6.5–8.1)

## 2019-12-27 LAB — CBC
HCT: 38.2 % (ref 36.0–46.0)
Hemoglobin: 12.4 g/dL (ref 12.0–15.0)
MCH: 30.4 pg (ref 26.0–34.0)
MCHC: 32.5 g/dL (ref 30.0–36.0)
MCV: 93.6 fL (ref 80.0–100.0)
Platelets: 302 10*3/uL (ref 150–400)
RBC: 4.08 MIL/uL (ref 3.87–5.11)
RDW: 12.2 % (ref 11.5–15.5)
WBC: 7.1 10*3/uL (ref 4.0–10.5)
nRBC: 0 % (ref 0.0–0.2)

## 2019-12-27 LAB — LIPASE, BLOOD: Lipase: 21 U/L (ref 11–51)

## 2019-12-27 MED ORDER — KETOROLAC TROMETHAMINE 15 MG/ML IJ SOLN
15.0000 mg | Freq: Once | INTRAMUSCULAR | Status: AC
  Administered 2019-12-27: 15 mg via INTRAVENOUS
  Filled 2019-12-27: qty 1

## 2019-12-27 MED ORDER — METOCLOPRAMIDE HCL 10 MG PO TABS: 10.0000 mg | ORAL_TABLET | Freq: Four times a day (QID) | ORAL | 0 refills | Status: DC | PRN

## 2019-12-27 MED ORDER — ONDANSETRON HCL 4 MG/2ML IJ SOLN
4.0000 mg | Freq: Once | INTRAMUSCULAR | Status: AC
  Administered 2019-12-27: 4 mg via INTRAVENOUS
  Filled 2019-12-27: qty 2

## 2019-12-27 MED ORDER — SODIUM CHLORIDE 0.9 % IV BOLUS
1000.0000 mL | Freq: Once | INTRAVENOUS | Status: AC
  Administered 2019-12-27: 1000 mL via INTRAVENOUS

## 2019-12-27 MED ORDER — SODIUM CHLORIDE 0.9% FLUSH: 3.0000 mL | Freq: Once | INTRAVENOUS | Status: DC

## 2019-12-27 MED ORDER — IOHEXOL 300 MG/ML  SOLN
100.0000 mL | Freq: Once | INTRAMUSCULAR | Status: AC | PRN
  Administered 2019-12-27: 100 mL via INTRAVENOUS
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
## 2019-12-27 NOTE — ED Notes (Signed)
Patient transported to CT 

## 2019-12-27 NOTE — ED Notes (Signed)
Patient verbalizes understanding of discharge instructions. Opportunity for questioning and answers were provided. Armband removed by staff, pt discharged from ED. Pt. ambulatory and discharged home.  

## 2019-12-27 NOTE — ED Triage Notes (Signed)
Pt reports lower abd/back pain, N/V x1 week. Seen at [facility] for the same.

## 2019-12-27 NOTE — Discharge Instructions (Signed)
Follow-up with a local doctor if no improvement next couple days.  You can use Reglan as needed for nausea and vomiting.  Return for new or worsening symptoms.

## 2021-03-08 ENCOUNTER — Emergency Department (HOSPITAL_BASED_OUTPATIENT_CLINIC_OR_DEPARTMENT_OTHER)
Admission: EM | Admit: 2021-03-08 | Discharge: 2021-03-08 | Disposition: A | Discharge status: Home / Self Care | Payer: 59 | Attending: Emergency Medicine | Admitting: Emergency Medicine

## 2021-03-08 ENCOUNTER — Other Ambulatory Visit: Payer: Self-pay

## 2021-03-08 ENCOUNTER — Encounter (HOSPITAL_BASED_OUTPATIENT_CLINIC_OR_DEPARTMENT_OTHER): Payer: Self-pay

## 2021-03-08 ENCOUNTER — Emergency Department (HOSPITAL_BASED_OUTPATIENT_CLINIC_OR_DEPARTMENT_OTHER): Payer: 59

## 2021-03-08 DIAGNOSIS — R519 Headache, unspecified: Secondary | ICD-10-CM | POA: Insufficient documentation

## 2021-03-08 LAB — CBC WITH DIFFERENTIAL/PLATELET
Abs Immature Granulocytes: 0.05 10*3/uL (ref 0.00–0.07)
Basophils Absolute: 0.1 10*3/uL (ref 0.0–0.1)
Basophils Relative: 1 %
Eosinophils Absolute: 0.1 10*3/uL (ref 0.0–0.5)
Eosinophils Relative: 1 %
HCT: 34.9 % — ABNORMAL LOW (ref 36.0–46.0)
Hemoglobin: 11.7 g/dL — ABNORMAL LOW (ref 12.0–15.0)
Immature Granulocytes: 1 %
Lymphocytes Relative: 35 %
Lymphs Abs: 3.3 10*3/uL (ref 0.7–4.0)
MCH: 31.5 pg (ref 26.0–34.0)
MCHC: 33.5 g/dL (ref 30.0–36.0)
MCV: 93.8 fL (ref 80.0–100.0)
Monocytes Absolute: 0.6 10*3/uL (ref 0.1–1.0)
Monocytes Relative: 6 %
Neutro Abs: 5.2 10*3/uL (ref 1.7–7.7)
Neutrophils Relative %: 56 %
Platelets: 253 10*3/uL (ref 150–400)
RBC: 3.72 MIL/uL — ABNORMAL LOW (ref 3.87–5.11)
RDW: 12.3 % (ref 11.5–15.5)
WBC: 9.3 10*3/uL (ref 4.0–10.5)
nRBC: 0 % (ref 0.0–0.2)

## 2021-03-08 LAB — BASIC METABOLIC PANEL
Anion gap: 9 (ref 5–15)
BUN: 13 mg/dL (ref 6–20)
CO2: 24 mmol/L (ref 22–32)
Calcium: 9 mg/dL (ref 8.9–10.3)
Chloride: 105 mmol/L (ref 98–111)
Creatinine, Ser: 0.6 mg/dL (ref 0.44–1.00)
GFR, Estimated: >60 mL/min (ref >60)
Glucose, Bld: 95 mg/dL (ref 70–99)
Potassium: 3.7 mmol/L (ref 3.5–5.1)
Sodium: 138 mmol/L (ref 135–145)

## 2021-03-08 MED ORDER — DIPHENHYDRAMINE HCL 50 MG/ML IJ SOLN
25.0000 mg | Freq: Once | INTRAMUSCULAR | Status: AC
  Administered 2021-03-08: 25 mg via INTRAVENOUS
  Filled 2021-03-08: qty 1

## 2021-03-08 MED ORDER — SODIUM CHLORIDE 0.9 % IV BOLUS
1000.0000 mL | Freq: Once | INTRAVENOUS | Status: AC
  Administered 2021-03-08: 1000 mL via INTRAVENOUS

## 2021-03-08 MED ORDER — KETOROLAC TROMETHAMINE 15 MG/ML IJ SOLN
15.0000 mg | Freq: Once | INTRAMUSCULAR | Status: AC
  Administered 2021-03-08: 15 mg via INTRAVENOUS
  Filled 2021-03-08: qty 1

## 2021-03-08 MED ORDER — METOCLOPRAMIDE HCL 5 MG/ML IJ SOLN
10.0000 mg | Freq: Once | INTRAMUSCULAR | Status: AC
  Administered 2021-03-08: 10 mg via INTRAVENOUS
  Filled 2021-03-08: qty 2
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
## 2021-03-08 NOTE — ED Notes (Signed)
This RN presented the AVS utilizing Teachback Method. Patient verbalizes understanding of Discharge Instructions. Opportunity for Questioning and Answers were provided. Patient Discharged from ED ambulatory to Home.   

## 2021-03-08 NOTE — Discharge Instructions (Signed)
If you develop continued, recurrent, or worsening headache, fever, neck stiffness, vomiting, blurry or double vision, weakness or numbness in your arms or legs, trouble speaking, or any other new/concerning symptoms then return to the ER for evaluation.  

## 2021-03-08 NOTE — ED Triage Notes (Signed)
Patient here POV from Home with Headache approximately 2 weeks.   No recent Trauma. GCS 15. No Neurological Deficits.  No Relief from Tylenol, Ibuprofen, or Tramadol.

## 2021-03-09 ENCOUNTER — Encounter: Payer: Self-pay | Admitting: Neurology

## 2021-04-01 NOTE — Telephone Encounter (Signed)
Error

## 2021-05-17 NOTE — Progress Notes (Deleted)
NEUROLOGY CONSULTATION NOTE  Rachel Sanders MRN: [id number] DOB: 10-24-1990  Referring provider: Pricilla Loveless, MD (ED referral) Primary care provider: ***  Reason for consult:  headache  Assessment/Plan:   ***   Subjective:  Rachel Sanders is a 30 year old ***-handed female who presents for headache.  History supplemented by ED notes.  ***.  She was seen in the ED on 03/08/2021.  CT head personally reviewed was unremarkable and she was treated with a migraine cocktail.   Current NSAIDS/analgesics:  ibuprofen 600mg , Fioricet Current triptans:  none Current ergotamine:  none Current anti-emetic:  Zofran, Reglan Current muscle relaxants:  none Current Antihypertensive medications:  none Current Antidepressant medications:  none Current Anticonvulsant medications:  none Current anti-CGRP:  none Current Vitamins/Herbal/Supplements:  none Current Antihistamines/Decongestants:  none Other therapy:  none Hormone/birth control:  none  Past NSAIDS/analgesics:  Tylenol, Excedrin Migraine, Toradol 10mg  tab, naproxen, tramadol Past abortive triptans:  *** Past abortive ergotamine:  *** Past muscle relaxants:  Flexeril Past anti-emetic:  *** Past antihypertensive medications:  *** Past antidepressant medications:  *** Past anticonvulsant medications:  *** Past anti-CGRP:  *** Past vitamins/Herbal/Supplements:  *** Past antihistamines/decongestants:  *** Other past therapies:  ***  Caffeine:  *** Alcohol:  *** Smoker:  *** Diet:  *** Exercise:  *** Depression:  ***; Anxiety:  *** Other pain:  *** Sleep hygiene:  *** Family history of headache:  ***      PAST MEDICAL HISTORY: Past Medical History:  Diagnosis Date   Anemia    Blood transfusion affecting pregnancy    Chicken pox    as a child   PPH (postpartum hemorrhage)    last delivery and current delivery of 2016    PAST SURGICAL HISTORY: Past Surgical History:  Procedure Laterality Date   NO PAST SURGERIES       MEDICATIONS: Current Outpatient Medications on File Prior to Visit  Medication Sig Dispense Refill   Butalbital-APAP-Caffeine (FIORICET) 50-300-40 MG CAPS Take 1 capsule by mouth every 4 (four) hours as needed.     ferrous sulfate 325 (65 FE) MG tablet Take 1 tablet (325 mg total) by mouth daily. 30 tablet 3   ibuprofen (ADVIL) 600 MG tablet Take 1 tablet (600 mg total) by mouth every 6 (six) hours as needed. 30 tablet 0   metoCLOPramide (REGLAN) 10 MG tablet Take 1 tablet (10 mg total) by mouth every 6 (six) hours as needed for nausea (nausea/headache). 6 tablet 0   metroNIDAZOLE (FLAGYL) 500 MG tablet Take 1 tablet (500 mg total) by mouth 2 (two) times daily. (Patient not taking: Reported on 12/25/2019) 14 tablet 0   ondansetron (ZOFRAN ODT) 4 MG disintegrating tablet Take 1 tablet (4 mg total) by mouth every 8 (eight) hours as needed for nausea or vomiting. 20 tablet 0   Prenatal Vit-Fe Fumarate-FA (PRENATAL MULTIVITAMIN) TABS tablet Take 1 tablet by mouth daily at 12 noon.     No current facility-administered medications on file prior to visit.    ALLERGIES: No Known Allergies  FAMILY HISTORY: Family History  Problem Relation Age of Onset   Asthma Father    Asthma Sister     Objective:  *** General: No acute distress.  Patient appears well-groomed.   Head:  Normocephalic/atraumatic Eyes:  fundi examined but not visualized Neck: supple, no paraspinal tenderness, full range of motion Back: No paraspinal tenderness Heart: regular rate and rhythm Lungs: Clear to auscultation bilaterally. Vascular: No carotid bruits. Neurological Exam: Mental status: alert and oriented  to person, place, and time, recent and remote memory intact, fund of knowledge intact, attention and concentration intact, speech fluent and not dysarthric, language intact. Cranial nerves: CN I: not tested CN II: pupils equal, round and reactive to light, visual fields intact CN III, IV, VI:  full range of  motion, no nystagmus, no ptosis CN V: facial sensation intact. CN VII: upper and lower face symmetric CN VIII: hearing intact CN IX, X: gag intact, uvula midline CN XI: sternocleidomastoid and trapezius muscles intact CN XII: tongue midline Bulk & Tone: normal, no fasciculations. Motor:  muscle strength 5/5 throughout Sensation:  Pinprick, temperature and vibratory sensation intact. Deep Tendon Reflexes:  2+ throughout,  toes downgoing.   Finger to nose testing:  Without dysmetria.   Heel to shin:  Without dysmetria.   Gait:  Normal station and stride.  Romberg negative.    Thank you for allowing me to take part in the care of this patient.  Shon Millet, DO  CC: ***

## 2021-05-18 ENCOUNTER — Ambulatory Visit: Payer: 59 | Admitting: Neurology

## 2021-07-10 ENCOUNTER — Ambulatory Visit: Payer: Self-pay | Admitting: Surgery

## 2021-07-10 NOTE — H&P (Signed)
Subjective    Chief Complaint: Umbilical Hernia       History of Present Illness: Rachel Sanders is a 30 y.o. female who is seen today as an office consultation at the request of Dr. Normand Sloop for evaluation of Umbilical Hernia .     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  Recently, this has been causing increasing sharp radiating pains around the umbilicus.  The hernia seems to be slightly larger.  She denies any obstructive symptoms.  No imaging.  She was diagnosed with an umbilical hernia and is referred to [place] for evaluation.  She does not plan to have any more children.  No previous abdominal surgery.  The patient works from home.     Review of Systems: A complete review of systems was obtained from the patient.  I have reviewed this information and discussed as appropriate with the patient.  See HPI as well for other ROS.   Review of Systems  Constitutional: Negative.   HENT: Negative.   Eyes: Negative.   Respiratory: Negative.   Cardiovascular: Negative.   Gastrointestinal: Positive for abdominal pain.  Genitourinary: Negative.   Musculoskeletal: Negative.   Skin: Negative.   Neurological: Negative.   Endo/Heme/Allergies: Negative.   Psychiatric/Behavioral: Negative.         Medical History: Past Medical History      Past Medical History:  Diagnosis Date   Anemia             Patient Active Problem List  Diagnosis   Umbilical hernia without obstruction or gangrene      Past Surgical History  History reviewed. No pertinent surgical history.      Allergies  No Known Allergies     No current outpatient medications on file prior to visit.    No current facility-administered medications on file prior to visit.      Family History  History reviewed. No pertinent family history.      Social History       Tobacco Use  Smoking Status Not on file  Smokeless Tobacco Not on file      Social  History  Social History        Socioeconomic History   Marital status: Single  Substance and Sexual Activity   Alcohol use: Defer   Drug use: Defer   Sexual activity: Defer        Objective:         Vitals:    07/10/21 1023  Pulse: 94  Temp: 36.8 C (98.2 F)  SpO2: 100%  Weight: 79.1 kg (174 lb 6.4 oz)  Height: 167.6 cm (5\' 6" )    Body mass index is 28.15 kg/m.   Physical Exam    Constitutional:  WDWN in NAD, conversant, no obvious deformities; lying in bed comfortably Eyes:  Pupils equal, round; sclera anicteric; moist conjunctiva; no lid lag HENT:  Oral mucosa moist; good dentition  Neck:  No masses palpated, trachea midline; no thyromegaly Lungs:  CTA bilaterally; normal respiratory effort CV:  Regular rate and rhythm; no murmurs; extremities well-perfused with no edema Abd:  +bowel sounds, soft, non-tender, no palpable organomegaly; upper midline rectus diastases: Patient has fairly lax abdominal muscular tone. Just above the umbilicus there is a 1.5 cm fascial defect.  She has an old scar from a piercing located over this area.  The hernia is easily reducible. Musc:  Unable to assess gait; no apparent clubbing or cyanosis  in extremities Lymphatic:  No palpable cervical or axillary lymphadenopathy Skin:  Warm, dry; no sign of jaundice Psychiatric - alert and oriented x 4; calm mood and affect     Assessment and Plan:  Diagnoses and all orders for this visit:   Umbilical hernia without obstruction or gangrene       Umbilical hernia repair with mesh.The surgical procedure has been discussed with the patient.  Potential risks, benefits, alternative treatments, and expected outcomes have been explained.  All of the patient's questions at this time have been answered.  The likelihood of reaching the patient's treatment goal is good.  The patient understand the proposed surgical procedure and wishes to proceed.     No follow-ups on file.   Alvin Rubano Delbert Harness, MD   07/10/2021 10:38 AM

## 2021-07-25 ENCOUNTER — Other Ambulatory Visit: Payer: Self-pay | Admitting: Surgery

## 2022-02-01 ENCOUNTER — Emergency Department (HOSPITAL_BASED_OUTPATIENT_CLINIC_OR_DEPARTMENT_OTHER)
Admission: EM | Admit: 2022-02-01 | Discharge: 2022-02-01 | Disposition: A | Discharge status: Home / Self Care | Payer: 59 | Attending: Emergency Medicine | Admitting: Emergency Medicine

## 2022-02-01 ENCOUNTER — Other Ambulatory Visit: Payer: Self-pay

## 2022-02-01 ENCOUNTER — Emergency Department (HOSPITAL_BASED_OUTPATIENT_CLINIC_OR_DEPARTMENT_OTHER): Payer: 59

## 2022-02-01 ENCOUNTER — Emergency Department (HOSPITAL_BASED_OUTPATIENT_CLINIC_OR_DEPARTMENT_OTHER): Payer: 59 | Admitting: Radiology

## 2022-02-01 ENCOUNTER — Encounter (HOSPITAL_BASED_OUTPATIENT_CLINIC_OR_DEPARTMENT_OTHER): Payer: Self-pay | Admitting: Emergency Medicine

## 2022-02-01 DIAGNOSIS — R079 Chest pain, unspecified: Secondary | ICD-10-CM | POA: Insufficient documentation

## 2022-02-01 DIAGNOSIS — Z20822 Contact with and (suspected) exposure to covid-19: Secondary | ICD-10-CM | POA: Diagnosis not present

## 2022-02-01 DIAGNOSIS — R911 Solitary pulmonary nodule: Secondary | ICD-10-CM

## 2022-02-01 DIAGNOSIS — R000 Tachycardia, unspecified: Secondary | ICD-10-CM | POA: Insufficient documentation

## 2022-02-01 DIAGNOSIS — R0602 Shortness of breath: Secondary | ICD-10-CM | POA: Diagnosis not present

## 2022-02-01 DIAGNOSIS — M546 Pain in thoracic spine: Secondary | ICD-10-CM | POA: Diagnosis not present

## 2022-02-01 LAB — CBC
HCT: 36.9 % (ref 36.0–46.0)
Hemoglobin: 12.1 g/dL (ref 12.0–15.0)
MCH: 30.4 pg (ref 26.0–34.0)
MCHC: 32.8 g/dL (ref 30.0–36.0)
MCV: 92.7 fL (ref 80.0–100.0)
Platelets: 274 10*3/uL (ref 150–400)
RBC: 3.98 MIL/uL (ref 3.87–5.11)
RDW: 12.1 % (ref 11.5–15.5)
WBC: 12.4 10*3/uL — ABNORMAL HIGH (ref 4.0–10.5)
nRBC: 0 % (ref 0.0–0.2)

## 2022-02-01 LAB — BASIC METABOLIC PANEL
Anion gap: 9 (ref 5–15)
BUN: 8 mg/dL (ref 6–20)
CO2: 21 mmol/L — ABNORMAL LOW (ref 22–32)
Calcium: 9.7 mg/dL (ref 8.9–10.3)
Chloride: 106 mmol/L (ref 98–111)
Creatinine, Ser: 0.75 mg/dL (ref 0.44–1.00)
GFR, Estimated: >60 mL/min (ref >60)
Glucose, Bld: 113 mg/dL — ABNORMAL HIGH (ref 70–99)
Potassium: 3.7 mmol/L (ref 3.5–5.1)
Sodium: 136 mmol/L (ref 135–145)

## 2022-02-01 LAB — TROPONIN I (HIGH SENSITIVITY)
Troponin I (High Sensitivity): <2 ng/L (ref <18)
Troponin I (High Sensitivity): <2 ng/L (ref <18)

## 2022-02-01 LAB — RESP PANEL BY RT-PCR (FLU A&B, COVID) ARPGX2
Influenza A by PCR: NEGATIVE
Influenza B by PCR: NEGATIVE
SARS Coronavirus 2 by RT PCR: NEGATIVE

## 2022-02-01 LAB — HCG, SERUM, QUALITATIVE: Preg, Serum: NEGATIVE

## 2022-02-01 MED ORDER — MORPHINE SULFATE (PF) 4 MG/ML IV SOLN
4.0000 mg | Freq: Once | INTRAVENOUS | Status: AC
  Administered 2022-02-01: 4 mg via INTRAVENOUS
  Filled 2022-02-01: qty 1

## 2022-02-01 MED ORDER — CYCLOBENZAPRINE HCL 5 MG PO TABS: 5.0000 mg | ORAL_TABLET | Freq: Three times a day (TID) | ORAL | 0 refills | Status: DC | PRN

## 2022-02-01 MED ORDER — SODIUM CHLORIDE 0.9 % IV BOLUS
1000.0000 mL | Freq: Once | INTRAVENOUS | Status: AC
  Administered 2022-02-01: 1000 mL via INTRAVENOUS

## 2022-02-01 MED ORDER — LIDOCAINE 5 % EX PTCH: 1.0000 | MEDICATED_PATCH | CUTANEOUS | 0 refills | Status: DC

## 2022-02-01 MED ORDER — IOHEXOL 350 MG/ML SOLN
100.0000 mL | Freq: Once | INTRAVENOUS | Status: AC | PRN
  Administered 2022-02-01: 75 mL via INTRAVENOUS

## 2022-02-01 NOTE — ED Notes (Signed)
EMT-P provided AVS using Teachback Method. Patient verbalizes understanding of Discharge Instructions. Opportunity for Questioning and Answers were provided by EMT-P. Patient Discharged from ED.  ? ?

## 2022-02-01 NOTE — Discharge Instructions (Addendum)
Here heart enzyme test is normal right now ? ?You also do not have a blood clot in your CT scan.  You have an incidental lung nodule that just need to be follow-up with your doctor. ? ?I suspect that you either have a viral infection or muscle spasms. ? ?Take ibuprofen for pain. ? ?Take Flexeril for severe muscle spasms. ? ?You can try lidocaine patch as well ? ?See your doctor for follow-up and if you have persistent pain you can follow-up with the cardiology ? ?Return to ER if you have worse chest pain, shortness of breath, back pain ?

## 2022-02-01 NOTE — ED Triage Notes (Signed)
Pt arrives to ED with c/o chest pain. This started x2 days ago. The pain is located centralized and radiates to her right shoulder blade. The pain is described as sharp. She reports the pain has worsened today. Associated symptoms include cough x1 day and back pain.  ?

## 2022-02-01 NOTE — ED Provider Notes (Signed)
?Rachel Sanders ?Provider Note ? ? ?CSN: [id number] ?Arrival date & time: 02/01/22  1658 ? ?  ? ?History ? ?Chief Complaint  ?Patient presents with  ? Chest Pain  ? ? ?Rachel Sanders is a 31 y.o. female presenting with chest pain.  Patient states that since yesterday she has been having some chest pain and shortness of breath and back pain.  Patient states that this is unusual for her.  She denies any trauma or injury.  Denies any fever or cough.  Patient was noted to be tachycardic in the ED.  No history of blood clots or CAD or stents ? ?The history is provided by the patient.  ? ?  ? ?Home Medications ?Prior to Admission medications   ?Medication Sig Start Date End Date Taking? Authorizing Provider  ?Butalbital-APAP-Caffeine (FIORICET) 50-300-40 MG CAPS Take 1 capsule by mouth every 4 (four) hours as needed.    [provider]  ?ferrous sulfate 325 (65 FE) MG tablet Take 1 tablet (325 mg total) by mouth daily. 08/01/18 08/01/19  Shirley Friar, CNM  ?ibuprofen (ADVIL) 600 MG tablet Take 1 tablet (600 mg total) by mouth every 6 (six) hours as needed. 12/25/19   Wieters, Hallie C, PA-C  ?metoCLOPramide (REGLAN) 10 MG tablet Take 1 tablet (10 mg total) by mouth every 6 (six) hours as needed for nausea (nausea/headache). 12/27/19   Elnora Morrison, MD  ?metroNIDAZOLE (FLAGYL) 500 MG tablet Take 1 tablet (500 mg total) by mouth 2 (two) times daily. ?Patient not taking: Reported on 12/25/2019 12/07/18   Wende Mott, CNM  ?ondansetron (ZOFRAN ODT) 4 MG disintegrating tablet Take 1 tablet (4 mg total) by mouth every 8 (eight) hours as needed for nausea or vomiting. 12/25/19   Wieters, Elesa Hacker, PA-C  ?Prenatal Vit-Fe Fumarate-FA (PRENATAL MULTIVITAMIN) TABS tablet Take 1 tablet by mouth daily at 12 noon.    [provider]  ?   ? ?Allergies    ?Patient has no known allergies.   ? ?Review of Systems   ?Review of Systems  ?Cardiovascular:  Positive for chest pain.  ?All other systems  reviewed and are negative. ? ?Physical Exam ?Updated Vital Signs ?BP 129/71   Pulse (!) 116   Temp 99.2 ?F (37.3 ?C) (Oral)   Resp (!) 24   Ht 5\' 5"  (1.651 m)   Wt 79.4 kg   LMP  (LMP Unknown)   SpO2 98%   BMI 29.12 kg/m?  ?Physical Exam ?Vitals and nursing note reviewed.  ?Constitutional:   ?   Comments: Uncomfortable  ?HENT:  ?   Head: Normocephalic.  ?Eyes:  ?   Extraocular Movements: Extraocular movements intact.  ?   Pupils: Pupils are equal, round, and reactive to light.  ?Cardiovascular:  ?   Rate and Rhythm: Regular rhythm. Tachycardia present.  ?   Heart sounds: Normal heart sounds.  ?Pulmonary:  ?   Effort: Pulmonary effort is normal.  ?   Breath sounds: Normal breath sounds.  ?Chest:  ?   Comments: Reproducible left scapular tenderness ?Abdominal:  ?   General: Bowel sounds are normal.  ?   Palpations: Abdomen is soft.  ?Musculoskeletal:     ?   General: Normal range of motion.  ?   Cervical back: Normal range of motion and neck supple.  ?Skin: ?   General: Skin is warm.  ?   Capillary Refill: Capillary refill takes less than 2 seconds.  ?Psychiatric:     ?  Mood and Affect: Mood normal.     ?   Behavior: Behavior normal.  ? ? ?ED Results / Procedures / Treatments   ?Labs ?(all labs ordered are listed, but only abnormal results are displayed) ?Labs Reviewed  ?BASIC METABOLIC PANEL - Abnormal; Notable for the following components:  ?    Result Value  ? CO2 21 (*)   ? Glucose, Bld 113 (*)   ? All other components within normal limits  ?CBC - Abnormal; Notable for the following components:  ? WBC 12.4 (*)   ? All other components within normal limits  ?RESP PANEL BY RT-PCR (FLU A&B, COVID) ARPGX2  ?HCG, SERUM, QUALITATIVE  ?TROPONIN I (HIGH SENSITIVITY)  ?TROPONIN I (HIGH SENSITIVITY)  ? ? ?EKG ?EKG Interpretation ? ?Date/Time:  Thursday February 01 2022 17:37:02 EDT ?Ventricular Rate:  101 ?PR Interval:  142 ?QRS Duration: 98 ?QT Interval:  342 ?QTC Calculation: 443 ?R Axis:   37 ?Text  Interpretation: Sinus tachycardia Otherwise normal ECG When compared with ECG of 06-Mar-2017 07:05, PREVIOUS ECG IS PRESENT Confirmed by Wandra Arthurs [phone]) on 02/01/2022 5:58:27 PM ? ?Radiology ?DG Chest 2 View ? ?Result Date: 02/01/2022 ?CLINICAL DATA:  Chest pain. EXAM: CHEST - 2 VIEW COMPARISON:  Chest two views 03/05/2017 FINDINGS: Cardiac silhouette and mediastinal contours are within normal limits. The lungs are clear. No pleural effusion or pneumothorax. No acute skeletal abnormality. Bilateral nipple piercings are again incidentally noted. IMPRESSION: No active cardiopulmonary disease. Electronically Signed   By: Yvonne Kendall M.D.   On: 02/01/2022 17:48  ? ?CT Angio Chest PE W and/or Wo Contrast ? ?Result Date: 02/01/2022 ?CLINICAL DATA:  Chest pain x2 days. EXAM: CT ANGIOGRAPHY CHEST WITH CONTRAST TECHNIQUE: Multidetector CT imaging of the chest was performed using the standard protocol during bolus administration of intravenous contrast. Multiplanar CT image reconstructions and MIPs were obtained to evaluate the vascular anatomy. RADIATION DOSE REDUCTION: This exam was performed according to the departmental dose-optimization program which includes automated exposure control, adjustment of the mA and/or kV according to patient size and/or use of iterative reconstruction technique. CONTRAST:  32mL OMNIPAQUE IOHEXOL 350 MG/ML SOLN COMPARISON:  Abdomen and pelvis CT, dated December 27, 2019 FINDINGS: Cardiovascular: Satisfactory opacification of the pulmonary arteries to the segmental level. No evidence of pulmonary embolism. Normal heart size. No pericardial effusion. Mediastinum/Nodes: No enlarged mediastinal, hilar, or axillary lymph nodes. Thyroid gland, trachea, and esophagus demonstrate no significant findings. Lungs/Pleura: A stable, likely benign 3 mm noncalcified lung nodule is seen within the posterolateral aspect of the right lower lobe (axial CT image 100, CT series 6). There is no evidence of acute  infiltrate, pleural effusion or pneumothorax. Upper Abdomen: No acute abnormality. Musculoskeletal: No chest wall abnormality. No acute or significant osseous findings. Review of the MIP images confirms the above findings. IMPRESSION: 1. No CT evidence of pulmonary embolism or other acute cardiopulmonary disease. 2. Stable, likely benign 3 mm noncalcified lung nodule within the posterolateral aspect of the right lower lobe. Electronically Signed   By: Virgina Norfolk M.D.   On: 02/01/2022 20:39   ? ?Procedures ?Procedures  ? ? ?Medications Ordered in ED ?Medications  ?morphine (PF) 4 MG/ML injection 4 mg (4 mg Intravenous Given 02/01/22 2014)  ?sodium chloride 0.9 % bolus 1,000 mL (1,000 mLs Intravenous New Bag/Given 02/01/22 2014)  ?iohexol (OMNIPAQUE) 350 MG/ML injection 100 mL (75 mLs Intravenous Contrast Given 02/01/22 2029)  ? ? ?ED Course/ Medical Decision Making/ A&P ?  ?                        ?  Medical Decision Making ?Rachel Sanders is a 31 y.o. female here presenting with chest pain and shortness of breath.  Patient is tachycardic.  Consider PE.  Low risk for ACS and symptoms for 24 hours or so 1 troponin sufficient.  We will get CTA to rule out PE and CBC and BMP and troponin x1. ? ?9:20 PM ?Heart rate down to 104 now.  CTA chest showed benign lung nodule.  Troponin is negative.  COVID and flu test is negative.  I suspect either musculoskeletal pain or viral syndrome.  Will discharge patient home with lidocaine patch and Flexeril.  ? ?Problems Addressed: ?Acute thoracic back pain, unspecified back pain laterality: acute illness or injury ?Chest pain, unspecified type: acute illness or injury ? ?Amount and/or Complexity of Data Reviewed ?External Data Reviewed: labs. ?Labs: ordered. Decision-making details documented in ED Course. ?Radiology: ordered and independent interpretation performed. Decision-making details documented in ED Course. ?ECG/medicine tests: ordered and independent interpretation performed.  Decision-making details documented in ED Course. ? ?Risk ?Prescription drug management. ? ? ?Final Clinical Impression(s) / ED Diagnoses ?Final diagnoses:  ?None  ? ? ?Rx / DC Orders ?ED Discharge Orders   ? ? None  ? ?  ? ? ?

## 2022-11-06 ENCOUNTER — Encounter (HOSPITAL_COMMUNITY): Payer: Self-pay | Admitting: Emergency Medicine

## 2022-11-06 ENCOUNTER — Ambulatory Visit (HOSPITAL_COMMUNITY)
Admission: EM | Admit: 2022-11-06 | Discharge: 2022-11-06 | Disposition: A | Discharge status: Home / Self Care | Payer: 59 | Attending: Nurse Practitioner | Admitting: Nurse Practitioner

## 2022-11-06 ENCOUNTER — Ambulatory Visit (INDEPENDENT_AMBULATORY_CARE_PROVIDER_SITE_OTHER): Payer: 59

## 2022-11-06 DIAGNOSIS — R059 Cough, unspecified: Secondary | ICD-10-CM | POA: Insufficient documentation

## 2022-11-06 DIAGNOSIS — J069 Acute upper respiratory infection, unspecified: Secondary | ICD-10-CM | POA: Diagnosis present

## 2022-11-06 DIAGNOSIS — Z1152 Encounter for screening for COVID-19: Secondary | ICD-10-CM | POA: Insufficient documentation

## 2022-11-06 DIAGNOSIS — J029 Acute pharyngitis, unspecified: Secondary | ICD-10-CM | POA: Diagnosis not present

## 2022-11-06 DIAGNOSIS — M546 Pain in thoracic spine: Secondary | ICD-10-CM | POA: Diagnosis not present

## 2022-11-06 DIAGNOSIS — Z20828 Contact with and (suspected) exposure to other viral communicable diseases: Secondary | ICD-10-CM | POA: Insufficient documentation

## 2022-11-06 DIAGNOSIS — R0602 Shortness of breath: Secondary | ICD-10-CM | POA: Insufficient documentation

## 2022-11-06 LAB — POC INFLUENZA A AND B ANTIGEN (URGENT CARE ONLY)
INFLUENZA A ANTIGEN, POC: NEGATIVE
INFLUENZA B ANTIGEN, POC: NEGATIVE

## 2022-11-06 MED ORDER — ACETAMINOPHEN 325 MG PO TABS
ORAL_TABLET | ORAL | Status: AC
  Filled 2022-11-06: qty 3

## 2022-11-06 MED ORDER — ACETAMINOPHEN 325 MG PO TABS
975.0000 mg | ORAL_TABLET | Freq: Once | ORAL | Status: AC
  Administered 2022-11-06: 975 mg via ORAL

## 2022-11-06 MED ORDER — AMOXICILLIN-POT CLAVULANATE 875-125 MG PO TABS: 1.0000 | ORAL_TABLET | Freq: Two times a day (BID) | ORAL | 0 refills | Status: DC

## 2022-11-06 NOTE — ED Triage Notes (Addendum)
Reports she was very drowsy throughout the day, falling asleep at work. Then began having a fever (Tmax 101 at home). Reports her cough started last night, and began having headache, generalized body aches, nausea, vomiting, and chest pain around 11 am. Pt states the pain does not improve/change with sitting forward  Took tylenol without improvement. Son diagnosed with the flu 1.5 weeks ago.
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
## 2022-11-06 NOTE — Discharge Instructions (Addendum)
Your COVID is pending.  Influenza is negative.You will be contact if your COVID is positive with additional treatment .  Your xray is negative for pneumonia . You have been prescribed Augmentin 875 mg twice a day for 7 days.  We encourage conservative treatment with symptom relief. We encourage you to use Tylenol alternating with Ibuprofen for your fever if not contraindicated. (Remember to use as directed do not exceed daily dosing recommendations) We also encourage salt water gargles for your sore throat. You should also consider throat lozenges and chloraseptic spray.  Your cough can be soothed with a cough suppressant.

## 2022-11-08 ENCOUNTER — Ambulatory Visit (HOSPITAL_COMMUNITY)
Admission: EM | Admit: 2022-11-08 | Discharge: 2022-11-08 | Disposition: A | Discharge status: Home / Self Care | Payer: 59 | Attending: Sports Medicine | Admitting: Sports Medicine

## 2022-11-08 ENCOUNTER — Encounter (HOSPITAL_COMMUNITY): Payer: Self-pay

## 2022-11-08 DIAGNOSIS — R1084 Generalized abdominal pain: Secondary | ICD-10-CM | POA: Insufficient documentation

## 2022-11-08 DIAGNOSIS — M546 Pain in thoracic spine: Secondary | ICD-10-CM | POA: Diagnosis present

## 2022-11-08 DIAGNOSIS — R079 Chest pain, unspecified: Secondary | ICD-10-CM | POA: Insufficient documentation

## 2022-11-08 LAB — CBC WITH DIFFERENTIAL/PLATELET
Abs Immature Granulocytes: 0.01 10*3/uL (ref 0.00–0.07)
Basophils Absolute: 0 10*3/uL (ref 0.0–0.1)
Basophils Relative: 1 %
Eosinophils Absolute: 0 10*3/uL (ref 0.0–0.5)
Eosinophils Relative: 0 %
HCT: 40.3 % (ref 36.0–46.0)
Hemoglobin: 13.6 g/dL (ref 12.0–15.0)
Immature Granulocytes: 0 %
Lymphocytes Relative: 37 %
Lymphs Abs: 1.3 10*3/uL (ref 0.7–4.0)
MCH: 31.6 pg (ref 26.0–34.0)
MCHC: 33.7 g/dL (ref 30.0–36.0)
MCV: 93.5 fL (ref 80.0–100.0)
Monocytes Absolute: 0.4 10*3/uL (ref 0.1–1.0)
Monocytes Relative: 11 %
Neutro Abs: 1.7 10*3/uL (ref 1.7–7.7)
Neutrophils Relative %: 51 %
Platelets: 245 10*3/uL (ref 150–400)
RBC: 4.31 MIL/uL (ref 3.87–5.11)
RDW: 12.6 % (ref 11.5–15.5)
WBC: 3.4 10*3/uL — ABNORMAL LOW (ref 4.0–10.5)
nRBC: 0 % (ref 0.0–0.2)

## 2022-11-08 LAB — SARS CORONAVIRUS 2 (TAT 6-24 HRS): SARS Coronavirus 2: NEGATIVE

## 2022-11-08 MED ORDER — LIDOCAINE 5 % EX PTCH: 1.0000 | MEDICATED_PATCH | CUTANEOUS | 0 refills | Status: AC

## 2022-11-08 MED ORDER — KETOROLAC TROMETHAMINE 30 MG/ML IJ SOLN
30.0000 mg | Freq: Once | INTRAMUSCULAR | Status: AC
  Administered 2022-11-08: 30 mg via INTRAMUSCULAR

## 2022-11-08 MED ORDER — KETOROLAC TROMETHAMINE 30 MG/ML IJ SOLN
INTRAMUSCULAR | Status: AC
  Filled 2022-11-08: qty 1

## 2022-11-08 MED ORDER — CYCLOBENZAPRINE HCL 5 MG PO TABS: 5.0000 mg | ORAL_TABLET | Freq: Three times a day (TID) | ORAL | 0 refills | Status: DC | PRN

## 2022-11-08 NOTE — ED Triage Notes (Signed)
Pt states fever, abdominal pain,back pain,chest pain, sob on exertion.  Seen her 2 days ago for same. States she is not feeling any better. States she took Tylenol at 0700 for her fever and is taking the Augmentin that was prescribed for her.
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
## 2022-11-08 NOTE — Discharge Instructions (Addendum)
Your abdominal pain is likely secondary to antibiotic use, I recommend you begin a probiotic and continue to take your antibiotics with food. Your back pain is likely secondary to muscle spasms and strain secondary to the coughing you have been having.  Today you received a Toradol injection, anti-inflammatory medication and I have sent to your pharmacy muscle relaxer to use for the next couple of days.  Caution as this may make you drowsy. In light of your chest pain like this is likely reactive secondary to the illness you are experiencing.  I am hopeful that the anti-inflammatory medication will help with your chest pain as well.  We have also sent to your pharmacy some patches to help with the pain in your chest.  Your EKG was reassuring for no acute changes.  If your chest pain worsens acutely I recommend urgent evaluation at the nearest ER or urgent care.  Follow-up with primary care provider who can get you connected with cardiology.

## 2022-11-09 ENCOUNTER — Encounter (HOSPITAL_BASED_OUTPATIENT_CLINIC_OR_DEPARTMENT_OTHER): Payer: Self-pay

## 2022-11-09 ENCOUNTER — Other Ambulatory Visit: Payer: Self-pay

## 2022-11-09 ENCOUNTER — Emergency Department (HOSPITAL_BASED_OUTPATIENT_CLINIC_OR_DEPARTMENT_OTHER): Payer: 59 | Admitting: Radiology

## 2022-11-09 ENCOUNTER — Emergency Department (HOSPITAL_BASED_OUTPATIENT_CLINIC_OR_DEPARTMENT_OTHER): Payer: 59

## 2022-11-09 ENCOUNTER — Emergency Department (HOSPITAL_BASED_OUTPATIENT_CLINIC_OR_DEPARTMENT_OTHER)
Admission: EM | Admit: 2022-11-09 | Discharge: 2022-11-09 | Disposition: A | Discharge status: Home / Self Care | Payer: 59 | Attending: Emergency Medicine | Admitting: Emergency Medicine

## 2022-11-09 DIAGNOSIS — J40 Bronchitis, not specified as acute or chronic: Secondary | ICD-10-CM | POA: Diagnosis not present

## 2022-11-09 DIAGNOSIS — I2694 Multiple subsegmental pulmonary emboli without acute cor pulmonale: Secondary | ICD-10-CM | POA: Diagnosis not present

## 2022-11-09 DIAGNOSIS — R1012 Left upper quadrant pain: Secondary | ICD-10-CM | POA: Insufficient documentation

## 2022-11-09 DIAGNOSIS — Z1152 Encounter for screening for COVID-19: Secondary | ICD-10-CM | POA: Diagnosis not present

## 2022-11-09 DIAGNOSIS — Z7901 Long term (current) use of anticoagulants: Secondary | ICD-10-CM | POA: Diagnosis not present

## 2022-11-09 DIAGNOSIS — R079 Chest pain, unspecified: Secondary | ICD-10-CM | POA: Diagnosis present

## 2022-11-09 LAB — D-DIMER, QUANTITATIVE: D-Dimer, Quant: 0.75 ug/mL-FEU — ABNORMAL HIGH (ref 0.00–0.50)

## 2022-11-09 LAB — COMPREHENSIVE METABOLIC PANEL
ALT: 14 U/L (ref 0–44)
AST: 17 U/L (ref 15–41)
Albumin: 4.1 g/dL (ref 3.5–5.0)
Alkaline Phosphatase: 46 U/L (ref 38–126)
Anion gap: 10 (ref 5–15)
BUN: 19 mg/dL (ref 6–20)
CO2: 22 mmol/L (ref 22–32)
Calcium: 9.2 mg/dL (ref 8.9–10.3)
Chloride: 106 mmol/L (ref 98–111)
Creatinine, Ser: 0.73 mg/dL (ref 0.44–1.00)
GFR, Estimated: >60 mL/min (ref >60)
Glucose, Bld: 94 mg/dL (ref 70–99)
Potassium: 3.8 mmol/L (ref 3.5–5.1)
Sodium: 138 mmol/L (ref 135–145)
Total Bilirubin: 0.4 mg/dL (ref 0.3–1.2)
Total Protein: 7.5 g/dL (ref 6.5–8.1)

## 2022-11-09 LAB — CBC WITH DIFFERENTIAL/PLATELET
Abs Immature Granulocytes: 0.01 10*3/uL (ref 0.00–0.07)
Basophils Absolute: 0 10*3/uL (ref 0.0–0.1)
Basophils Relative: 0 %
Eosinophils Absolute: 0 10*3/uL (ref 0.0–0.5)
Eosinophils Relative: 1 %
HCT: 36.7 % (ref 36.0–46.0)
Hemoglobin: 12.5 g/dL (ref 12.0–15.0)
Immature Granulocytes: 0 %
Lymphocytes Relative: 36 %
Lymphs Abs: 2.2 10*3/uL (ref 0.7–4.0)
MCH: 31.2 pg (ref 26.0–34.0)
MCHC: 34.1 g/dL (ref 30.0–36.0)
MCV: 91.5 fL (ref 80.0–100.0)
Monocytes Absolute: 0.6 10*3/uL (ref 0.1–1.0)
Monocytes Relative: 10 %
Neutro Abs: 3.3 10*3/uL (ref 1.7–7.7)
Neutrophils Relative %: 53 %
Platelets: 242 10*3/uL (ref 150–400)
RBC: 4.01 MIL/uL (ref 3.87–5.11)
RDW: 12.5 % (ref 11.5–15.5)
WBC: 6.2 10*3/uL (ref 4.0–10.5)
nRBC: 0 % (ref 0.0–0.2)

## 2022-11-09 LAB — RESP PANEL BY RT-PCR (RSV, FLU A&B, COVID)  RVPGX2
Influenza A by PCR: NEGATIVE
Influenza B by PCR: NEGATIVE
Resp Syncytial Virus by PCR: NEGATIVE
SARS Coronavirus 2 by RT PCR: NEGATIVE

## 2022-11-09 LAB — LIPASE, BLOOD: Lipase: 18 U/L (ref 11–51)

## 2022-11-09 LAB — HCG, QUANTITATIVE, PREGNANCY: hCG, Beta Chain, Quant, S: <1 m[IU]/mL (ref <5)

## 2022-11-09 MED ORDER — IOHEXOL 350 MG/ML SOLN
100.0000 mL | Freq: Once | INTRAVENOUS | Status: AC | PRN
  Administered 2022-11-09: 70 mL via INTRAVENOUS

## 2022-11-09 MED ORDER — RIVAROXABAN (XARELTO) VTE STARTER PACK (15 & 20 MG): ORAL_TABLET | ORAL | 0 refills | Status: DC

## 2022-11-09 MED ORDER — HYDROCOD POLI-CHLORPHE POLI ER 10-8 MG/5ML PO SUER: 5.0000 mL | Freq: Two times a day (BID) | ORAL | 0 refills | Status: DC | PRN

## 2022-11-09 MED ORDER — HYDROCOD POLI-CHLORPHE POLI ER 10-8 MG/5ML PO SUER: 5.0000 mL | Freq: Two times a day (BID) | ORAL | 0 refills | Status: AC | PRN

## 2022-11-09 NOTE — ED Triage Notes (Signed)
Pt to ED pov from home. Pt c/o chest pain, shortness of breath, back pain and fevers for 2 days. Pt seen at UC x 2 for same. Pt received toradol and muscle relaxer w/o relief.

## 2022-11-09 NOTE — Discharge Instructions (Signed)
While on Xarelto, please do not take NSAIDs such as aspirin, ibuprofen, or naproxen. Use only acetaminophen for pain or fever.
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
## 2022-12-07 ENCOUNTER — Encounter: Payer: Self-pay | Admitting: Oncology

## 2022-12-07 ENCOUNTER — Inpatient Hospital Stay: Payer: 59

## 2022-12-07 ENCOUNTER — Inpatient Hospital Stay: Payer: 59 | Attending: Oncology | Admitting: Oncology

## 2022-12-07 ENCOUNTER — Other Ambulatory Visit: Payer: Self-pay

## 2022-12-07 VITALS — BP 127/74 | HR 97 | Temp 97.5°F | Resp 19 | Ht 65.0 in | Wt 180.6 lb

## 2022-12-07 DIAGNOSIS — I2693 Single subsegmental pulmonary embolism without acute cor pulmonale: Secondary | ICD-10-CM | POA: Diagnosis not present

## 2022-12-07 DIAGNOSIS — D6859 Other primary thrombophilia: Secondary | ICD-10-CM

## 2022-12-07 DIAGNOSIS — E669 Obesity, unspecified: Secondary | ICD-10-CM

## 2022-12-07 DIAGNOSIS — Z7901 Long term (current) use of anticoagulants: Secondary | ICD-10-CM | POA: Insufficient documentation

## 2022-12-07 DIAGNOSIS — Z5181 Encounter for therapeutic drug level monitoring: Secondary | ICD-10-CM

## 2022-12-07 DIAGNOSIS — I2694 Multiple subsegmental pulmonary emboli without acute cor pulmonale: Secondary | ICD-10-CM

## 2022-12-07 DIAGNOSIS — I2699 Other pulmonary embolism without acute cor pulmonale: Secondary | ICD-10-CM | POA: Insufficient documentation

## 2022-12-07 LAB — APTT: aPTT: 30 seconds (ref 24–36)

## 2022-12-07 LAB — FIBRINOGEN: Fibrinogen: 497 mg/dL — ABNORMAL HIGH (ref 210–475)

## 2022-12-07 LAB — D-DIMER, QUANTITATIVE: D-Dimer, Quant: <0.27 ug/mL-FEU (ref 0.00–0.50)

## 2022-12-07 LAB — PROTIME-INR
INR: 1.1 (ref 0.8–1.2)
Prothrombin Time: 13.7 seconds (ref 11.4–15.2)

## 2022-12-07 NOTE — Progress Notes (Unsigned)
[facility] Initial Visit:  Patient Care Team: Pcp, No as PCP - General  CHIEF COMPLAINTS/PURPOSE OF CONSULTATION:  HISTORY OF PRESENTING ILLNESS: Rachel Sanders 32 y.o. female is here because of  pulmonary embolism  Medical history notable for anemia, chickenpox, postpartum hemorrhage  December 27, 2019: CT AP with contrast to evaluate abdominal pain, nausea emesis x 1 week.  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  No acute findings  February 01, 2022: CTPA no evidence of PE.  3 mm noncalcified lung nodule in posterior lateral aspect of right lower lobe  November 06, 2022: Presented to urgent care with URI which began that day.  Placed on Augmentin   Negative for influenza and COVID   November 09, 2022: Presented to the emergency room with sharp pleuritic chest and upper back pain which began the previous day  CTPA  Right upper lobe segmental and subsegmental pulmonary emboli. No evidence of right heart strain. Scattered tree-in-bud nodular opacities in the lungs bilaterally, which may be infectious or inflammatory  WBC 6.2 hemoglobin 12.5 platelet 242; 53 segs 37 lymphs 10 monos 1 EO D-dimer 0.75.  Beta-hCG negative CMP normal  Patient was placed on Xarelto and discharged home   December 07 2022:  [facility] Hematology Consult Patient had no prior history of VTE.  Patient is G3 P3 with last pregnancy in 2019.  Patient had postpartum hemorrhages with first and 2nd deliveries but not with third.  She states that on November 06 2022 she had fatigue, DOE on walking to bedroom to bathroom.  No LE pain or edema.    Social:  Works for [employer].  Sits much of the day but goes to the gym regularly.  Tobacco none.  EtOH on occasion.    [facility] Mother alive 92 well Father alive 13 well Sister alive 1 DM and Asthma  Sister alive 17 well Brother alive 23 well   Review of Systems  Constitutional:  Negative for appetite change, chills, fatigue,  fever and unexpected weight change.  HENT:   Negative for mouth sores, nosebleeds, sore throat, tinnitus, trouble swallowing and voice change.   Eyes:  Negative for eye problems and icterus.       Vision changes:  None  Respiratory:  Negative for chest tightness, cough, hemoptysis, shortness of breath and wheezing.        PND:  none Orthopnea:  none DOE:    Cardiovascular:  Negative for chest pain, leg swelling and palpitations.       PND:  none Orthopnea:  none  Gastrointestinal:  Negative for abdominal distention, abdominal pain, blood in stool, constipation, diarrhea, nausea, rectal pain and vomiting.  Endocrine: Negative for hot flashes.       Cold intolerance:  none Heat intolerance:  none  Genitourinary:  Negative for bladder incontinence, difficulty urinating, dysuria, frequency, hematuria and nocturia.   Musculoskeletal:  Positive for back pain. Negative for arthralgias, gait problem, myalgias, neck pain and neck stiffness.  Skin:  Negative for itching, rash and wound.  Neurological:  Negative for dizziness, extremity weakness, gait problem, headaches, light-headedness, numbness and speech difficulty.  Hematological:  Negative for adenopathy. Does not bruise/bleed easily.  Psychiatric/Behavioral:  Negative for sleep disturbance and suicidal ideas. The patient is not nervous/anxious.     MEDICAL HISTORY: Past Medical History:  Diagnosis Date  . Anemia   . Blood transfusion affecting pregnancy   . Chicken pox    as a child  .  PPH (postpartum hemorrhage)    last delivery and current delivery of 2016    SURGICAL HISTORY: Past Surgical History:  Procedure Laterality Date  . NO PAST SURGERIES      SOCIAL HISTORY: Social History   Socioeconomic History  . Marital status: Single    Spouse name: Not on file  . Number of children: Not on file  . Years of education: Some colle  . Highest education level: Not on file  Occupational History  . Not on file  Tobacco Use  .  Smoking status: Never  . Smokeless tobacco: Never  Vaping Use  . Vaping Use: Never used  Substance and Sexual Activity  . Alcohol use: No    Alcohol/week: 0.0 standard drinks of alcohol  . Drug use: No  . Sexual activity: Yes    Birth control/protection: I.U.D.  Other Topics Concern  . Not on file  Social History Narrative  . Not on file   Social Determinants of Health   Financial Resource Strain: Not on file  Food Insecurity: Not on file  Transportation Needs: Not on file  Physical Activity: Not on file  Stress: Not on file  Social Connections: Not on file  Intimate Partner Violence: Not on file    FAMILY HISTORY Family History  Problem Relation Age of Onset  . Asthma Father   . Asthma Sister     ALLERGIES:  has No Known Allergies.  MEDICATIONS:  Current Outpatient Medications  Medication Sig Dispense Refill  . amoxicillin-clavulanate (AUGMENTIN) 875-125 MG tablet Take 1 tablet by mouth every 12 (twelve) hours. 14 tablet 0  . Butalbital-APAP-Caffeine (FIORICET) 50-300-40 MG CAPS Take 1 capsule by mouth every 4 (four) hours as needed.    . cyclobenzaprine (FLEXERIL) 5 MG tablet Take 1 tablet (5 mg total) by mouth 3 (three) times daily as needed for muscle spasms. 21 tablet 0  . ferrous sulfate 325 (65 FE) MG tablet Take 1 tablet (325 mg total) by mouth daily. 30 tablet 3  . lidocaine (LIDODERM) 5 % Place 1 patch onto the skin daily. Remove & Discard patch within 12 hours or as directed by MD 10 patch 0  . metoCLOPramide (REGLAN) 10 MG tablet Take 1 tablet (10 mg total) by mouth every 6 (six) hours as needed for nausea (nausea/headache). 6 tablet 0  . ondansetron (ZOFRAN ODT) 4 MG disintegrating tablet Take 1 tablet (4 mg total) by mouth every 8 (eight) hours as needed for nausea or vomiting. 20 tablet 0  . Prenatal Vit-Fe Fumarate-FA (PRENATAL MULTIVITAMIN) TABS tablet Take 1 tablet by mouth daily at 12 noon.    Marland Kitchen RIVAROXABAN (XARELTO) VTE STARTER PACK (15 & 20 MG)  Follow package directions: Take one 60m tablet by mouth twice a day. On day 22, switch to one 273mtablet once a day. Take with food. 51 each 0   No current facility-administered medications for this visit.    PHYSICAL EXAMINATION:  ECOG PERFORMANCE STATUS: {CHL ONC ECOG [phone]}   There were no vitals filed for this visit.  There were no vitals filed for this visit.   Physical Exam Vitals and nursing note reviewed.  Constitutional:      General: She is not in acute distress.    Appearance: Normal appearance. She is obese. She is not ill-appearing, toxic-appearing or diaphoretic.     Comments: Here alone  HENT:     Head: Normocephalic and atraumatic.     Right Ear: External ear normal.     Left  Ear: External ear normal.     Nose: Nose normal. No congestion or rhinorrhea.  Eyes:     General: No scleral icterus.    Extraocular Movements: Extraocular movements intact.     Conjunctiva/sclera: Conjunctivae normal.     Pupils: Pupils are equal, round, and reactive to light.  Cardiovascular:     Rate and Rhythm: Normal rate.     Heart sounds: No murmur heard.    No friction rub. No gallop.  Abdominal:     General: Bowel sounds are normal.     Palpations: Abdomen is soft.  Musculoskeletal:        General: No swelling, tenderness or deformity.     Cervical back: Normal range of motion and neck supple. No rigidity or tenderness.  Lymphadenopathy:     Head:     Right side of head: No submental, submandibular, tonsillar, preauricular, posterior auricular or occipital adenopathy.     Left side of head: No submental, submandibular, tonsillar, preauricular, posterior auricular or occipital adenopathy.     Cervical: No cervical adenopathy.     Right cervical: No superficial, deep or posterior cervical adenopathy.    Left cervical: No superficial, deep or posterior cervical adenopathy.     Upper Body:     Right upper body: No supraclavicular, axillary, pectoral or epitrochlear  adenopathy.     Left upper body: No supraclavicular, axillary, pectoral or epitrochlear adenopathy.  Skin:    General: Skin is warm.     Coloration: Skin is not jaundiced.  Neurological:     General: No focal deficit present.     Mental Status: She is alert and oriented to person, place, and time. Mental status is at baseline.     Cranial Nerves: No cranial nerve deficit.  Psychiatric:        Mood and Affect: Mood normal.        Behavior: Behavior normal.        Thought Content: Thought content normal.        Judgment: Judgment normal.    LABORATORY DATA: I have personally reviewed the data as listed:  Admission on 11/09/2022, Discharged on 11/09/2022  Component Date Value Ref Range Status  . SARS Coronavirus 2 by RT PCR 11/09/2022 NEGATIVE  NEGATIVE Final   Comment: (NOTE) SARS-CoV-2 target nucleic acids are NOT DETECTED.  The SARS-CoV-2 RNA is generally detectable in upper respiratory specimens during the acute phase of infection. The lowest concentration of SARS-CoV-2 viral copies this assay can detect is 138 copies/mL. A negative result does not preclude SARS-Cov-2 infection and should not be used as the sole basis for treatment or other patient management decisions. A negative result may occur with  improper specimen collection/handling, submission of specimen other than nasopharyngeal swab, presence of viral mutation(s) within the areas targeted by this assay, and inadequate number of viral copies(<138 copies/mL). A negative result must be combined with clinical observations, patient history, and epidemiological information. The expected result is Negative.  Fact Sheet for Patients:  EntrepreneurPulse.com.au  Fact Sheet for Healthcare Providers:  IncredibleEmployment.be  This test is no                          t yet approved or cleared by the [place] FDA and  has been authorized for detection and/or diagnosis of SARS-CoV-2  by FDA under an Emergency Use Authorization (EUA). This EUA will remain  in effect (meaning this test can be used) for the duration  of the COVID-19 declaration under Section 564(b)(1) of the Act, 21 U.S.C.section 360bbb-3(b)(1), unless the authorization is terminated  or revoked sooner.      . Influenza A by PCR 11/09/2022 NEGATIVE  NEGATIVE Final  . Influenza B by PCR 11/09/2022 NEGATIVE  NEGATIVE Final   Comment: (NOTE) The Xpert Xpress SARS-CoV-2/FLU/RSV plus assay is intended as an aid in the diagnosis of influenza from Nasopharyngeal swab specimens and should not be used as a sole basis for treatment. Nasal washings and aspirates are unacceptable for Xpert Xpress SARS-CoV-2/FLU/RSV testing.  Fact Sheet for Patients: EntrepreneurPulse.com.au  Fact Sheet for Healthcare Providers: IncredibleEmployment.be  This test is not yet approved or cleared by the [place] FDA and has been authorized for detection and/or diagnosis of SARS-CoV-2 by FDA under an Emergency Use Authorization (EUA). This EUA will remain in effect (meaning this test can be used) for the duration of the COVID-19 declaration under Section 564(b)(1) of the Act, 21 U.S.C. section 360bbb-3(b)(1), unless the authorization is terminated or revoked.    Marland Kitchen Resp Syncytial Virus by PCR 11/09/2022 NEGATIVE  NEGATIVE Final   Comment: (NOTE) Fact Sheet for Patients: EntrepreneurPulse.com.au  Fact Sheet for Healthcare Providers: IncredibleEmployment.be  This test is not yet approved or cleared by the [place] FDA and has been authorized for detection and/or diagnosis of SARS-CoV-2 by FDA under an Emergency Use Authorization (EUA). This EUA will remain in effect (meaning this test can be used) for the duration of the COVID-19 declaration under Section 564(b)(1) of the Act, 21 U.S.C. section 360bbb-3(b)(1), unless the authorization is  terminated or revoked.  Performed at [facility], [address], [place], [place] [address]   . WBC 11/09/2022 6.2  4.0 - 10.5 K/uL Final  . RBC 11/09/2022 4.01  3.87 - 5.11 MIL/uL Final  . Hemoglobin 11/09/2022 12.5  12.0 - 15.0 g/dL Final  . HCT 11/09/2022 36.7  36.0 - 46.0 % Final  . MCV 11/09/2022 91.5  80.0 - 100.0 fL Final  . MCH 11/09/2022 31.2  26.0 - 34.0 pg Final  . MCHC 11/09/2022 34.1  30.0 - 36.0 g/dL Final  . RDW 11/09/2022 12.5  11.5 - 15.5 % Final  . Platelets 11/09/2022 242  150 - 400 K/uL Final  . nRBC 11/09/2022 0.0  0.0 - 0.2 % Final  . Neutrophils Relative % 11/09/2022 53  % Final  . Neutro Abs 11/09/2022 3.3  1.7 - 7.7 K/uL Final  . Lymphocytes Relative 11/09/2022 36  % Final  . Lymphs Abs 11/09/2022 2.2  0.7 - 4.0 K/uL Final  . Monocytes Relative 11/09/2022 10  % Final  . Monocytes Absolute 11/09/2022 0.6  0.1 - 1.0 K/uL Final  . Eosinophils Relative 11/09/2022 1  % Final  . Eosinophils Absolute 11/09/2022 0.0  0.0 - 0.5 K/uL Final  . Basophils Relative 11/09/2022 0  % Final  . Basophils Absolute 11/09/2022 0.0  0.0 - 0.1 K/uL Final  . Immature Granulocytes 11/09/2022 0  % Final  . Abs Immature Granulocytes 11/09/2022 0.01  0.00 - 0.07 K/uL Final   Performed at [facility], [address], [place], [place] [address]  . Sodium 11/09/2022 138  135 - 145 mmol/L Final  . Potassium 11/09/2022 3.8  3.5 - 5.1 mmol/L Final  . Chloride 11/09/2022 106  98 - 111 mmol/L Final  . CO2 11/09/2022 22  22 - 32 mmol/L Final  . Glucose, Bld 11/09/2022 94  70 - 99 mg/dL Final   Glucose reference  range applies only to samples taken after fasting for at least 8 hours.  . BUN 11/09/2022 19  6 - 20 mg/dL Final  . Creatinine, Ser 11/09/2022 0.73  0.44 - 1.00 mg/dL Final  . Calcium 11/09/2022 9.2  8.9 - 10.3 mg/dL Final  . Total Protein 11/09/2022 7.5  6.5 - 8.1 g/dL Final  . Albumin 11/09/2022 4.1  3.5 - 5.0 g/dL Final  . AST 11/09/2022  17  15 - 41 U/L Final  . ALT 11/09/2022 14  0 - 44 U/L Final  . Alkaline Phosphatase 11/09/2022 46  38 - 126 U/L Final  . Total Bilirubin 11/09/2022 0.4  0.3 - 1.2 mg/dL Final  . GFR, Estimated 11/09/2022 >60  >60 mL/min Final   Comment: (NOTE) Calculated using the CKD-EPI Creatinine Equation (2021)   . Anion gap 11/09/2022 10  5 - 15 Final   Performed at [facility], [address] [place], [place] [address]  . Lipase 11/09/2022 18  11 - 51 U/L Final   Performed at [facility], [address], [place], [place] [address]  . D-Dimer, Quant 11/09/2022 0.75 (H)  0.00 - 0.50 ug/mL-FEU Final   Comment: (NOTE) At the manufacturer cut-off value of 0.5 g/mL FEU, this assay has a negative predictive value of 95-100%.This assay is intended for use in conjunction with a clinical pretest probability (PTP) assessment model to exclude pulmonary embolism (PE) and deep venous thrombosis (DVT) in outpatients suspected of PE or DVT. Results should be correlated with clinical presentation. Performed at [facility], [address] [place], [place] [address]   . hCG, Levy Sjogren, Chauncey Cruel 11/09/2022 <1  <5 mIU/mL Final   Comment:          GEST. AGE      CONC.  (mIU/mL)   <=1 WEEK        5 - 50     2 WEEKS       50 - 500     3 WEEKS       100 - 10,000     4 WEEKS     1,000 - 30,000     5 WEEKS     3,500 - 115,000   6-8 WEEKS     12,000 - 270,000    12 WEEKS     15,000 - 220,000        FEMALE AND NON-PREGNANT FEMALE:     LESS THAN 5 mIU/mL Performed at [facility], [address], [place], [place] [address]   Admission on 11/08/2022, Discharged on 11/08/2022  Component Date Value Ref Range Status  . WBC 11/08/2022 3.4 (L)  4.0 - 10.5 K/uL Final  . RBC 11/08/2022 4.31  3.87 - 5.11 MIL/uL Final  . Hemoglobin 11/08/2022 13.6  12.0 - 15.0 g/dL Final  . HCT 11/08/2022 40.3  36.0 - 46.0 % Final  . MCV 11/08/2022 93.5   80.0 - 100.0 fL Final  . MCH 11/08/2022 31.6  26.0 - 34.0 pg Final  . MCHC 11/08/2022 33.7  30.0 - 36.0 g/dL Final  . RDW 11/08/2022 12.6  11.5 - 15.5 % Final  . Platelets 11/08/2022 245  150 - 400 K/uL Final  . nRBC 11/08/2022 0.0  0.0 - 0.2 % Final  . Neutrophils Relative % 11/08/2022 51  % Final  . Neutro Abs 11/08/2022 1.7  1.7 - 7.7 K/uL Final  . Lymphocytes Relative 11/08/2022 37  % Final  . Lymphs Abs 11/08/2022 1.3  0.7 - 4.0 K/uL Final  . Monocytes Relative 11/08/2022 11  % Final  . Monocytes Absolute 11/08/2022 0.4  0.1 - 1.0 K/uL Final  . Eosinophils Relative 11/08/2022 0  % Final  . Eosinophils Absolute 11/08/2022 0.0  0.0 - 0.5 K/uL Final  . Basophils Relative 11/08/2022 1  % Final  . Basophils Absolute 11/08/2022 0.0  0.0 - 0.1 K/uL Final  . Immature Granulocytes 11/08/2022 0  % Final  . Abs Immature Granulocytes 11/08/2022 0.01  0.00 - 0.07 K/uL Final   Performed at [facility], [place] [address], [place], [place] [address]    RADIOGRAPHIC STUDIES: I have personally reviewed the radiological images as listed and agree with the findings in the report  No results found.  ASSESSMENT/PLAN Cancer Staging  No matching staging information was found for the patient.   No problem-specific Assessment & Plan notes found for this encounter.   No orders of the defined types were placed in this encounter.   All questions were answered. The patient knows to call the clinic with any problems, questions or concerns.  This note was electronically signed.    Barbee Cough, MD  12/07/2022 8:44 AM

## 2022-12-08 LAB — FACTOR 8 ASSAY: Coagulation Factor VIII: 132 % (ref 56–140)

## 2022-12-09 LAB — BETA-2-GLYCOPROTEIN I ABS, IGG/M/A
Beta-2 Glyco I IgG: <9 GPI IgG units (ref 0–20)
Beta-2-Glycoprotein I IgA: <9 GPI IgA units (ref 0–25)
Beta-2-Glycoprotein I IgM: <9 GPI IgM units (ref 0–32)

## 2022-12-10 LAB — CARDIOLIPIN ANTIBODIES, IGM+IGG
Anticardiolipin IgG: <9 GPL U/mL (ref 0–14)
Anticardiolipin IgM: <9 MPL U/mL (ref 0–12)

## 2022-12-12 LAB — HGB FRACTIONATION CASCADE
Hgb A2: 2.9 % (ref 1.8–3.2)
Hgb A: 97.1 % (ref 96.4–98.8)
Hgb F: 0 % (ref 0.0–2.0)
Hgb S: 0 %

## 2022-12-13 DIAGNOSIS — E669 Obesity, unspecified: Secondary | ICD-10-CM | POA: Insufficient documentation

## 2022-12-13 DIAGNOSIS — D6859 Other primary thrombophilia: Secondary | ICD-10-CM | POA: Insufficient documentation

## 2022-12-13 DIAGNOSIS — E66811 Obesity, class 1: Secondary | ICD-10-CM | POA: Insufficient documentation

## 2022-12-17 ENCOUNTER — Emergency Department (HOSPITAL_BASED_OUTPATIENT_CLINIC_OR_DEPARTMENT_OTHER): Payer: 59

## 2022-12-17 ENCOUNTER — Emergency Department (HOSPITAL_BASED_OUTPATIENT_CLINIC_OR_DEPARTMENT_OTHER)
Admission: EM | Admit: 2022-12-17 | Discharge: 2022-12-17 | Disposition: A | Discharge status: Home / Self Care | Payer: 59 | Attending: Emergency Medicine | Admitting: Emergency Medicine

## 2022-12-17 ENCOUNTER — Other Ambulatory Visit: Payer: Self-pay

## 2022-12-17 ENCOUNTER — Encounter (HOSPITAL_BASED_OUTPATIENT_CLINIC_OR_DEPARTMENT_OTHER): Payer: Self-pay

## 2022-12-17 ENCOUNTER — Other Ambulatory Visit: Payer: Self-pay | Admitting: Family Medicine

## 2022-12-17 DIAGNOSIS — R600 Localized edema: Secondary | ICD-10-CM | POA: Diagnosis not present

## 2022-12-17 DIAGNOSIS — M79604 Pain in right leg: Secondary | ICD-10-CM | POA: Diagnosis not present

## 2022-12-17 DIAGNOSIS — R609 Edema, unspecified: Secondary | ICD-10-CM

## 2022-12-17 DIAGNOSIS — M79605 Pain in left leg: Secondary | ICD-10-CM | POA: Insufficient documentation

## 2022-12-17 DIAGNOSIS — Z7901 Long term (current) use of anticoagulants: Secondary | ICD-10-CM | POA: Diagnosis not present

## 2022-12-17 DIAGNOSIS — M7989 Other specified soft tissue disorders: Secondary | ICD-10-CM

## 2022-12-17 LAB — CBC WITH DIFFERENTIAL/PLATELET
Abs Immature Granulocytes: 0.02 10*3/uL (ref 0.00–0.07)
Basophils Absolute: 0.1 10*3/uL (ref 0.0–0.1)
Basophils Relative: 1 %
Eosinophils Absolute: 0.1 10*3/uL (ref 0.0–0.5)
Eosinophils Relative: 1 %
HCT: 34.6 % — ABNORMAL LOW (ref 36.0–46.0)
Hemoglobin: 11.6 g/dL — ABNORMAL LOW (ref 12.0–15.0)
Immature Granulocytes: 0 %
Lymphocytes Relative: 40 %
Lymphs Abs: 3.4 10*3/uL (ref 0.7–4.0)
MCH: 31.5 pg (ref 26.0–34.0)
MCHC: 33.5 g/dL (ref 30.0–36.0)
MCV: 94 fL (ref 80.0–100.0)
Monocytes Absolute: 0.6 10*3/uL (ref 0.1–1.0)
Monocytes Relative: 7 %
Neutro Abs: 4.4 10*3/uL (ref 1.7–7.7)
Neutrophils Relative %: 51 %
Platelets: 304 10*3/uL (ref 150–400)
RBC: 3.68 MIL/uL — ABNORMAL LOW (ref 3.87–5.11)
RDW: 12.8 % (ref 11.5–15.5)
WBC: 8.4 10*3/uL (ref 4.0–10.5)
nRBC: 0 % (ref 0.0–0.2)

## 2022-12-17 LAB — BASIC METABOLIC PANEL
Anion gap: 8 (ref 5–15)
BUN: 14 mg/dL (ref 6–20)
CO2: 24 mmol/L (ref 22–32)
Calcium: 9 mg/dL (ref 8.9–10.3)
Chloride: 106 mmol/L (ref 98–111)
Creatinine, Ser: 0.83 mg/dL (ref 0.44–1.00)
GFR, Estimated: >60 mL/min (ref >60)
Glucose, Bld: 103 mg/dL — ABNORMAL HIGH (ref 70–99)
Potassium: 3.8 mmol/L (ref 3.5–5.1)
Sodium: 138 mmol/L (ref 135–145)

## 2022-12-17 LAB — TROPONIN I (HIGH SENSITIVITY): Troponin I (High Sensitivity): <2 ng/L (ref <18)

## 2022-12-17 LAB — BRAIN NATRIURETIC PEPTIDE: B Natriuretic Peptide: 10.4 pg/mL (ref 0.0–100.0)

## 2022-12-17 NOTE — ED Triage Notes (Signed)
Patient here POV from Home.  Endorses taking Xarelto for PE for which she was diagnosed in Late January.   Noted Bilateral Lower Extremity Swelling/Pain for 3 weeks. Also endorses SOB that has been worse recently. No CP.   NAD Noted during Triage. A&Ox4. GCS 15. Ambulatory.

## 2022-12-17 NOTE — ED Notes (Signed)
Provider at bedside for evaluation.

## 2022-12-17 NOTE — ED Provider Notes (Signed)
[facility] Provider Note   CSN: [id number] Arrival date & time: 12/17/22  1950     History {Add pertinent medical, surgical, social history, OB history to HPI:1} Chief Complaint  Patient presents with   Leg Swelling    Rachel Sanders is a 32 y.o. female.  She was diagnosed with a PE in late January.  She has been on Xarelto since then.  For the last few weeks she has had some intermittent swelling in her lower extremities and pain in her calves.  She saw her PCP today who set her up for an outpatient ultrasound but they were unable to see your till next month.  Sent her to the emergency department to get an ultrasound.  She continues to have some intermittent shortness of breath.  She has had no bleeding no fevers.  The history is provided by the patient.  Leg Pain Location:  Leg Leg location:  L lower leg and R lower leg Pain details:    Quality:  Aching   Onset quality:  Gradual   Duration:  3 weeks   Timing:  Intermittent   Progression:  Unchanged Chronicity:  New Relieved by:  Nothing Worsened by:  Nothing Ineffective treatments:  None tried Associated symptoms: swelling   Associated symptoms: no fever        Home Medications Prior to Admission medications   Medication Sig Start Date End Date Taking? Authorizing Provider  cyclobenzaprine (FLEXERIL) 5 MG tablet Take 1 tablet (5 mg total) by mouth 3 (three) times daily as needed for muscle spasms. 11/08/22   Rodena Goldmann A, DO  ferrous sulfate 325 (65 FE) MG tablet Take 1 tablet (325 mg total) by mouth daily. 08/01/18 12/07/22  Shirley Friar, CNM  levonorgestrel (MIRENA, 52 MG,) 20 MCG/DAY IUD 1 applicator by Intrauterine route daily.    [provider]  lidocaine (LIDODERM) 5 % Place 1 patch onto the skin daily. Remove & Discard patch within 12 hours or as directed by MD 11/08/22   Elmore Guise, DO  RIVAROXABAN Alveda Reasons) VTE STARTER PACK (15 & 20 MG) Follow  package directions: Take one '15mg'$  tablet by mouth twice a day. On day 22, switch to one '20mg'$  tablet once a day. Take with food. 11/09/22   Fatima Blank, MD      Allergies    Patient has no known allergies.    Review of Systems   Review of Systems  Constitutional:  Negative for fever.  Respiratory:  Positive for shortness of breath.     Physical Exam Updated Vital Signs BP (!) 154/88 (BP Location: Right Arm)   Pulse 88   Temp 98.4 F (36.9 C) (Oral)   Resp 18   Ht '5\' 5"'$  (1.651 m)   Wt 81.9 kg   SpO2 100%   BMI 30.05 kg/m  Physical Exam Vitals and nursing note reviewed.  Constitutional:      General: She is not in acute distress.    Appearance: Normal appearance. She is well-developed.  HENT:     Head: Normocephalic and atraumatic.  Eyes:     Conjunctiva/sclera: Conjunctivae normal.  Cardiovascular:     Rate and Rhythm: Normal rate and regular rhythm.     Heart sounds: No murmur heard. Pulmonary:     Effort: Pulmonary effort is normal. No respiratory distress.     Breath sounds: Normal breath sounds.  Abdominal:     Palpations: Abdomen is soft.  Tenderness: There is no abdominal tenderness.  Musculoskeletal:     Cervical back: Neck supple.     Right lower leg: Edema present.     Left lower leg: Edema present.     Comments: Trace edema bilateral ankles.  No cords appreciated.  Distal motor and sensory intact.  Skin:    General: Skin is warm and dry.     Capillary Refill: Capillary refill takes less than 2 seconds.     Findings: No bruising.  Neurological:     General: No focal deficit present.     Mental Status: She is alert.     Sensory: No sensory deficit.     Motor: No weakness.     ED Results / Procedures / Treatments   Labs (all labs ordered are listed, but only abnormal results are displayed) Labs Reviewed  BASIC METABOLIC PANEL  CBC WITH DIFFERENTIAL/PLATELET  BRAIN NATRIURETIC PEPTIDE  TROPONIN I (HIGH SENSITIVITY)     EKG None  Radiology No results found.  Procedures Procedures  {Document cardiac monitor, telemetry assessment procedure when appropriate:1}  Medications Ordered in ED Medications - No data to display  ED Course/ Medical Decision Making/ A&P   {   Click here for ABCD2, HEART and other calculatorsREFRESH Note before signing :1}                          Medical Decision Making Amount and/or Complexity of Data Reviewed Labs: ordered.   This patient complains of ***; this involves an extensive number of treatment Options and is a complaint that carries with it a high risk of complications and morbidity. The differential includes ***  I ordered, reviewed and interpreted labs, which included *** I ordered medication *** and reviewed PMP when indicated. I ordered imaging studies which included *** and I independently    visualized and interpreted imaging which showed *** Additional history obtained from *** Previous records obtained and reviewed *** I consulted *** and discussed lab and imaging findings and discussed disposition.  Cardiac monitoring reviewed, *** Social determinants considered, *** Critical Interventions: ***  After the interventions stated above, I reevaluated the patient and found *** Admission and further testing considered, ***   {Document critical care time when appropriate:1} {Document review of labs and clinical decision tools ie heart score, Chads2Vasc2 etc:1}  {Document your independent review of radiology images, and any outside records:1} {Document your discussion with family members, caretakers, and with consultants:1} {Document social determinants of health affecting pt's care:1} {Document your decision making why or why not admission, treatments were needed:1} Final Clinical Impression(s) / ED Diagnoses Final diagnoses:  None    Rx / DC Orders ED Discharge Orders     None

## 2022-12-17 NOTE — ED Notes (Signed)
Reviewed AVS with patient, patient expressed understanding of directions, denies further questions at this time.

## 2022-12-17 NOTE — ED Notes (Signed)
Ultrasound at bedside

## 2022-12-17 NOTE — Discharge Instructions (Signed)
You were seen in the emergency department for pain and swelling of both of your legs.  You had an ultrasound that did not show any evidence of blood clot in either leg.  Your lab work was also fairly unremarkable.  Please continue your blood thinner and stick to a low-salt diet.  Elevate your legs.  Follow-up with your primary care doctor.  Return to the emergency department if any worsening or concerning symptoms.

## 2022-12-18 LAB — FACTOR 5 LEIDEN

## 2022-12-19 LAB — PROTHROMBIN GENE MUTATION

## 2022-12-31 ENCOUNTER — Other Ambulatory Visit: Payer: Self-pay | Admitting: Family Medicine

## 2022-12-31 DIAGNOSIS — N644 Mastodynia: Secondary | ICD-10-CM

## 2023-01-04 ENCOUNTER — Inpatient Hospital Stay (HOSPITAL_BASED_OUTPATIENT_CLINIC_OR_DEPARTMENT_OTHER): Payer: 59 | Admitting: Oncology

## 2023-01-04 ENCOUNTER — Other Ambulatory Visit: Payer: Self-pay

## 2023-01-04 ENCOUNTER — Inpatient Hospital Stay: Payer: 59 | Attending: Oncology

## 2023-01-04 VITALS — BP 130/77 | HR 90 | Temp 98.7°F | Resp 17 | Wt 178.8 lb

## 2023-01-04 DIAGNOSIS — Z86711 Personal history of pulmonary embolism: Secondary | ICD-10-CM | POA: Insufficient documentation

## 2023-01-04 DIAGNOSIS — Z5181 Encounter for therapeutic drug level monitoring: Secondary | ICD-10-CM | POA: Diagnosis not present

## 2023-01-04 DIAGNOSIS — M7918 Myalgia, other site: Secondary | ICD-10-CM | POA: Diagnosis not present

## 2023-01-04 DIAGNOSIS — Z7901 Long term (current) use of anticoagulants: Secondary | ICD-10-CM | POA: Diagnosis not present

## 2023-01-04 DIAGNOSIS — M79606 Pain in leg, unspecified: Secondary | ICD-10-CM | POA: Diagnosis not present

## 2023-01-04 DIAGNOSIS — I2693 Single subsegmental pulmonary embolism without acute cor pulmonale: Secondary | ICD-10-CM | POA: Insufficient documentation

## 2023-01-04 DIAGNOSIS — D6859 Other primary thrombophilia: Secondary | ICD-10-CM | POA: Diagnosis not present

## 2023-01-04 DIAGNOSIS — F17200 Nicotine dependence, unspecified, uncomplicated: Secondary | ICD-10-CM | POA: Insufficient documentation

## 2023-01-04 DIAGNOSIS — E669 Obesity, unspecified: Secondary | ICD-10-CM | POA: Diagnosis not present

## 2023-01-04 DIAGNOSIS — Z5986 Financial insecurity: Secondary | ICD-10-CM | POA: Insufficient documentation

## 2023-01-04 DIAGNOSIS — R5383 Other fatigue: Secondary | ICD-10-CM | POA: Diagnosis not present

## 2023-01-04 DIAGNOSIS — D6851 Activated protein C resistance: Secondary | ICD-10-CM | POA: Insufficient documentation

## 2023-01-04 DIAGNOSIS — M7989 Other specified soft tissue disorders: Secondary | ICD-10-CM | POA: Insufficient documentation

## 2023-01-04 DIAGNOSIS — Z79899 Other long term (current) drug therapy: Secondary | ICD-10-CM | POA: Diagnosis not present

## 2023-01-04 DIAGNOSIS — M546 Pain in thoracic spine: Secondary | ICD-10-CM | POA: Insufficient documentation

## 2023-01-04 DIAGNOSIS — I2694 Multiple subsegmental pulmonary emboli without acute cor pulmonale: Secondary | ICD-10-CM | POA: Diagnosis not present

## 2023-01-04 LAB — SEDIMENTATION RATE: Sed Rate: 6 mm/hr (ref 0–22)

## 2023-01-04 LAB — D-DIMER, QUANTITATIVE: D-Dimer, Quant: <0.27 ug/mL-FEU (ref 0.00–0.50)

## 2023-01-04 LAB — C-REACTIVE PROTEIN: CRP: <0.5 mg/dL (ref <1.0)

## 2023-01-04 MED ORDER — APIXABAN 5 MG PO TABS: 5.0000 mg | ORAL_TABLET | Freq: Two times a day (BID) | ORAL | 3 refills | Status: AC

## 2023-01-04 NOTE — Progress Notes (Unsigned)
[facility] up  Visit:  Patient Care Team: Eben Burow, NP as PCP - General (Family Medicine)  CHIEF COMPLAINTS/PURPOSE OF CONSULTATION:  HISTORY OF PRESENTING ILLNESS: Nimco Mchenry 32 y.o. female is here because of  pulmonary embolism  Medical history notable for anemia, chickenpox, postpartum hemorrhage  December 27, 2019: CT AP with contrast to evaluate abdominal pain, nausea emesis x 1 week.  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  No acute findings  February 01, 2022: CTPA no evidence of PE.  3 mm noncalcified lung nodule in posterior lateral aspect of right lower lobe  November 06, 2022: Presented to urgent care with URI which began that day.  Placed on Augmentin   Negative for influenza and COVID   November 09, 2022: Presented to the emergency room with sharp pleuritic chest and upper back pain which began the previous day  CTPA  Right upper lobe segmental and subsegmental pulmonary emboli. No evidence of right heart strain. Scattered tree-in-bud nodular opacities in the lungs bilaterally, which may be infectious or inflammatory  WBC 6.2 hemoglobin 12.5 platelet 242; 53 segs 37 lymphs 10 monos 1 EO D-dimer 0.75.  Beta-hCG negative CMP normal  Patient was placed on Xarelto and discharged home   December 07 2022:  [facility] Hematology Consult Patient had no prior history of VTE.  Patient is G3 P3 with last pregnancy in 2019.  Patient had postpartum hemorrhages with first and 2nd deliveries but not with third.  She states that on November 06 2022 she had fatigue, DOE on walking to bedroom to bathroom.  No LE pain or edema.    Social:  Works for [employer].  Sits much of the day but goes to the gym regularly.  Tobacco none.  EtOH on occasion.    [facility] Mother alive 61 well Father alive 64 well Sister alive 76 DM and Asthma  Sister alive 86 well Brother alive 39 well  Hemoglobin electrophoresis normal adult  pattern D-dimer undetectable.  Fibrinogen 497 INR 1.1 PTT 30 Factor V Leiden and prothrombin gene mutation testing negative Anticardiolipin antibody and antibeta 2 glycoprotein antibody negative  December 17, 2022: Patient referred to ED by PCP because patient was having pain and swelling in both legs, particularly ankles L> R Lower extremity Dopplers negative for DVT WBC 8.4 hemoglobin 11.6 MCV 94 platelet count 304; 51 segs 40 lymphs 7 monos 1 EO 1 basophil.   BMP notable for glucose of 103 troponin negative BNP 10.4  January 04 2023: Scheduled follow-up regarding pulmonary embolism.  Notes increased bruising on legs.  No epistaxis, gingival bleeding.  No menses because on IUD.  Pain and swelling in legs are 5/10 for which she takes tylenol with some relief.  Not taking oral iron  Xarelto-  Neuromuscular & skeletal: Back pain (3%), limb pain (pediatric patients and adults: 2% to 7%),   Review of Systems  Constitutional:  Negative for appetite change, chills, fatigue, fever and unexpected weight change.  HENT:   Negative for mouth sores, nosebleeds, sore throat, tinnitus, trouble swallowing and voice change.   Eyes:  Negative for eye problems and icterus.       Vision changes:  None  Respiratory:  Negative for chest tightness, cough, hemoptysis, shortness of breath and wheezing.        PND:  none Orthopnea:  none DOE:    Cardiovascular:  Negative for chest pain, leg swelling and palpitations.  PND:  none Orthopnea:  none  Gastrointestinal:  Negative for abdominal distention, abdominal pain, blood in stool, constipation, diarrhea, nausea, rectal pain and vomiting.  Endocrine: Negative for hot flashes.       Cold intolerance:  none Heat intolerance:  none  Genitourinary:  Negative for bladder incontinence, difficulty urinating, dysuria, frequency, hematuria and nocturia.   Musculoskeletal:  Positive for back pain. Negative for arthralgias, gait problem, myalgias, neck pain and neck  stiffness.  Skin:  Negative for itching, rash and wound.  Neurological:  Negative for dizziness, extremity weakness, gait problem, headaches, light-headedness, numbness and speech difficulty.  Hematological:  Negative for adenopathy. Does not bruise/bleed easily.  Psychiatric/Behavioral:  Negative for sleep disturbance and suicidal ideas. The patient is not nervous/anxious.     MEDICAL HISTORY: Past Medical History:  Diagnosis Date   Anemia    Blood transfusion affecting pregnancy    Chicken pox    as a child   PPH (postpartum hemorrhage)    last delivery and current delivery of 2016    SURGICAL HISTORY: Past Surgical History:  Procedure Laterality Date   NO PAST SURGERIES      SOCIAL HISTORY: Social History   Socioeconomic History   Marital status: Single    Spouse name: Not on file   Number of children: Not on file   Years of education: Some colle   Highest education level: Not on file  Occupational History   Not on file  Tobacco Use   Smoking status: Never   Smokeless tobacco: Never  Vaping Use   Vaping Use: Never used  Substance and Sexual Activity   Alcohol use: No    Alcohol/week: 0.0 standard drinks of alcohol   Drug use: No   Sexual activity: Yes    Birth control/protection: I.U.D.  Other Topics Concern   Not on file  Social History Narrative   Not on file   Social Determinants of Health   Financial Resource Strain: High Risk (12/07/2022)   Overall Financial Resource Strain (CARDIA)    Difficulty of Paying Living Expenses: Hard  Food Insecurity: Food Insecurity Present (12/07/2022)   Hunger Vital Sign    Worried About Running Out of Food in the Last Year: Sometimes true    Ran Out of Food in the Last Year: Sometimes true  Transportation Needs: No Transportation Needs (12/07/2022)   PRAPARE - Hydrologist (Medical): No    Lack of Transportation (Non-Medical): No  Physical Activity: Not on file  Stress: Not on file   Social Connections: Not on file  Intimate Partner Violence: Not At Risk (12/07/2022)   Humiliation, Afraid, Rape, and Kick questionnaire    Fear of Current or Ex-Partner: No    Emotionally Abused: No    Physically Abused: No    Sexually Abused: No    FAMILY HISTORY Family History  Problem Relation Age of Onset   Asthma Father    Asthma Sister     ALLERGIES:  has No Known Allergies.  MEDICATIONS:  Current Outpatient Medications  Medication Sig Dispense Refill   cyclobenzaprine (FLEXERIL) 5 MG tablet Take 1 tablet (5 mg total) by mouth 3 (three) times daily as needed for muscle spasms. 21 tablet 0   levonorgestrel (MIRENA, 52 MG,) 20 MCG/DAY IUD 1 applicator by Intrauterine route daily.     lidocaine (LIDODERM) 5 % Place 1 patch onto the skin daily. Remove & Discard patch within 12 hours or as directed by MD 10 patch 0  RIVAROXABAN (XARELTO) VTE STARTER PACK (15 & 20 MG) Follow package directions: Take one 15mg  tablet by mouth twice a day. On day 22, switch to one 20mg  tablet once a day. Take with food. 51 each 0   ferrous sulfate 325 (65 FE) MG tablet Take 1 tablet (325 mg total) by mouth daily. 30 tablet 3   No current facility-administered medications for this visit.    PHYSICAL EXAMINATION:  ECOG PERFORMANCE STATUS: 0 - Asymptomatic   Vitals:   01/04/23 0945  BP: 130/77  Pulse: 90  Resp: 17  Temp: 98.7 F (37.1 C)  SpO2: 98%    Filed Weights   01/04/23 0945  Weight: 178 lb 12.8 oz (81.1 kg)     Physical Exam Vitals and nursing note reviewed.  Constitutional:      General: She is not in acute distress.    Appearance: Normal appearance. She is obese. She is not ill-appearing, toxic-appearing or diaphoretic.     Comments: Here alone  HENT:     Head: Normocephalic and atraumatic.     Right Ear: External ear normal.     Left Ear: External ear normal.     Nose: Nose normal. No congestion or rhinorrhea.  Eyes:     General: No scleral icterus.     Extraocular Movements: Extraocular movements intact.     Conjunctiva/sclera: Conjunctivae normal.     Pupils: Pupils are equal, round, and reactive to light.  Cardiovascular:     Rate and Rhythm: Normal rate.     Heart sounds: No murmur heard.    No friction rub. No gallop.  Abdominal:     General: Bowel sounds are normal.     Palpations: Abdomen is soft.  Musculoskeletal:        General: No swelling, tenderness or deformity.     Cervical back: Normal range of motion and neck supple. No rigidity or tenderness.  Lymphadenopathy:     Head:     Right side of head: No submental, submandibular, tonsillar, preauricular, posterior auricular or occipital adenopathy.     Left side of head: No submental, submandibular, tonsillar, preauricular, posterior auricular or occipital adenopathy.     Cervical: No cervical adenopathy.     Right cervical: No superficial, deep or posterior cervical adenopathy.    Left cervical: No superficial, deep or posterior cervical adenopathy.     Upper Body:     Right upper body: No supraclavicular, axillary, pectoral or epitrochlear adenopathy.     Left upper body: No supraclavicular, axillary, pectoral or epitrochlear adenopathy.  Skin:    General: Skin is warm.     Coloration: Skin is not jaundiced.  Neurological:     General: No focal deficit present.     Mental Status: She is alert and oriented to person, place, and time. Mental status is at baseline.     Cranial Nerves: No cranial nerve deficit.  Psychiatric:        Mood and Affect: Mood normal.        Behavior: Behavior normal.        Thought Content: Thought content normal.        Judgment: Judgment normal.     LABORATORY DATA: I have personally reviewed the data as listed:  Admission on 12/17/2022, Discharged on 12/17/2022  Component Date Value Ref Range Status   Sodium 12/17/2022 138  135 - 145 mmol/L Final   Potassium 12/17/2022 3.8  3.5 - 5.1 mmol/L Final   Chloride 12/17/2022 106  98 -  111  mmol/L Final   CO2 12/17/2022 24  22 - 32 mmol/L Final   Glucose, Bld 12/17/2022 103 (H)  70 - 99 mg/dL Final   Glucose reference range applies only to samples taken after fasting for at least 8 hours.   BUN 12/17/2022 14  6 - 20 mg/dL Final   Creatinine, Ser 12/17/2022 0.83  0.44 - 1.00 mg/dL Final   Calcium 12/17/2022 9.0  8.9 - 10.3 mg/dL Final   GFR, Estimated 12/17/2022 >60  >60 mL/min Final   Comment: (NOTE) Calculated using the CKD-EPI Creatinine Equation (2021)    Anion gap 12/17/2022 8  5 - 15 Final   Performed at [facility], [address] [place], [place] [address]   WBC 12/17/2022 8.4  4.0 - 10.5 K/uL Final   RBC 12/17/2022 3.68 (L)  3.87 - 5.11 MIL/uL Final   Hemoglobin 12/17/2022 11.6 (L)  12.0 - 15.0 g/dL Final   HCT 12/17/2022 34.6 (L)  36.0 - 46.0 % Final   MCV 12/17/2022 94.0  80.0 - 100.0 fL Final   MCH 12/17/2022 31.5  26.0 - 34.0 pg Final   MCHC 12/17/2022 33.5  30.0 - 36.0 g/dL Final   RDW 12/17/2022 12.8  11.5 - 15.5 % Final   Platelets 12/17/2022 304  150 - 400 K/uL Final   nRBC 12/17/2022 0.0  0.0 - 0.2 % Final   Neutrophils Relative % 12/17/2022 51  % Final   Neutro Abs 12/17/2022 4.4  1.7 - 7.7 K/uL Final   Lymphocytes Relative 12/17/2022 40  % Final   Lymphs Abs 12/17/2022 3.4  0.7 - 4.0 K/uL Final   Monocytes Relative 12/17/2022 7  % Final   Monocytes Absolute 12/17/2022 0.6  0.1 - 1.0 K/uL Final   Eosinophils Relative 12/17/2022 1  % Final   Eosinophils Absolute 12/17/2022 0.1  0.0 - 0.5 K/uL Final   Basophils Relative 12/17/2022 1  % Final   Basophils Absolute 12/17/2022 0.1  0.0 - 0.1 K/uL Final   Immature Granulocytes 12/17/2022 0  % Final   Abs Immature Granulocytes 12/17/2022 0.02  0.00 - 0.07 K/uL Final   Performed at [facility], [place], [place] [address]   Troponin I (High Sensitivity) 12/17/2022 <2  <18 ng/L Final   Comment: (NOTE) Elevated high sensitivity troponin I (hsTnI)  values and significant  changes across serial measurements may suggest ACS but many other  chronic and acute conditions are known to elevate hsTnI results.  Refer to the "Links" section for chest pain algorithms and additional  guidance. Performed at [facility], [address], [place], [place] [address]    B Natriuretic Peptide 12/17/2022 10.4  0.0 - 100.0 pg/mL Final   Performed at [facility], [address] [place], [place] [address]  Appointment on 12/07/2022  Component Date Value Ref Range Status   D-Dimer, Quant 12/07/2022 <0.27  0.00 - 0.50 ug/mL-FEU Final   Comment: (NOTE) At the manufacturer cut-off value of 0.5 g/mL FEU, this assay has a negative predictive value of 95-100%.This assay is intended for use in conjunction with a clinical pretest probability (PTP) assessment model to exclude pulmonary embolism (PE) and deep venous thrombosis (DVT) in outpatients suspected of PE or DVT. Results should be correlated with clinical presentation. Performed at [facility], [place] [address], [place], [place] [address]    Prothrombin Time 12/07/2022 13.7  11.4 - 15.2 seconds Final   INR 12/07/2022 1.1  0.8 -  1.2 Final   Comment: (NOTE) INR goal varies based on device and disease states. Performed at [facility], [place] [address] [place], [place] [address]    aPTT 12/07/2022 30  24 - 36 seconds Final   Performed at [facility], [place] [address], [place], [place] [address]   Hgb F 12/07/2022 0.0  0.0 - 2.0 % Final   Hgb A 12/07/2022 97.1  96.4 - 98.8 % Final   Hgb A2 12/07/2022 2.9  1.8 - 3.2 % Final   Hgb S 12/07/2022 0.0  0.0 % Final   Interpretation, Hgb Fract 12/07/2022 Comment   Final   Comment: (NOTE) Normal hemoglobin present; no hemoglobin variant or beta thalassemia identified. Note: Alpha thalassemia may not be detected by the Hgb Fractionation Cascade panel. If alpha  thalassemia is suspected, [facility] offers Alpha-Thalassemia DNA Analysis [phone]). Performed At: [facility] [place], [place] [id number] Rush Farmer MD [id number]   Office Visit on 12/07/2022  Component Date Value Ref Range Status   Beta-2 Glyco I IgG 12/07/2022 <9  0 - 20 GPI IgG units Final   Comment: (NOTE) The reference interval reflects a 3SD or 99th percentile interval, which is thought to represent a potentially clinically significant result in accordance with the International Consensus Statement on the classification criteria for definitive antiphospholipid syndrome (APS). J Thromb Haem 2006;4:295-306.    Beta-2-Glycoprotein I IgM 12/07/2022 <9  0 - 32 GPI IgM units Final   Comment: (NOTE) The reference interval reflects a 3SD or 99th percentile interval, which is thought to represent a potentially clinically significant result in accordance with the International Consensus Statement on the classification criteria for definitive antiphospholipid syndrome (APS). J Thromb Haem 2006;4:295-306. Performed At: [facility] [place], [place] [id number] Rush Farmer MD [id number]    Beta-2-Glycoprotein I IgA 12/07/2022 <9  0 - 25 GPI IgA units Final   Comment: (NOTE) The reference interval reflects a 3SD or 99th percentile interval, which is thought to represent a potentially clinically significant result in accordance with the International Consensus Statement on the classification criteria for definitive antiphospholipid syndrome (APS). J Thromb Haem 2006;4:295-306.    Anticardiolipin IgG 12/07/2022 <9  0 - 14 GPL U/mL Final   Comment: (NOTE)                          Negative:              <15                          Indeterminate:     15 - 20                          Low-Med Positive: >20 - 80                          High Positive:         >80    Anticardiolipin IgM 12/07/2022 <9  0 - 12 MPL U/mL Final    Comment: (NOTE)                          Negative:              <13  Indeterminate:     13 - 20                          Low-Med Positive: >20 - 80                          High Positive:         >80 Performed At: [facility] [address] [place], [place] [id number] Rush Farmer MD [id number]    Coagulation Factor VIII 12/07/2022 132  56 - 140 % Final   Comment: (NOTE) Performed At: [facility] [place], [place] [id number] Rush Farmer MD [id number]    Recommendations-F5LEID: 12/07/2022 Comment   Final   Comment: (NOTE) Result: c.1601G>A (p.Arg534Gln) - Not Detected This result is not associated with an increased risk for venous thromboembolism. See Additional Clinical Information and Comments. Additional Clinical Information: Venous thromboembolism is a multifactorial disease influenced by genetic, environmental, and circumstantial risk factors. The c.1601G>A (p. Arg534Gln) variant in the F5 gene, commonly referred to as Factor V Leiden, is a genetic risk factor for venous thromboembolism. Heterozygous carriers of this variant have a 6- to 8- fold increased risk for venous thromboembolism. Individuals homozygous for this variant (ie, with a copy of the variant on each chromosome) have an approximately 80-fold increased risk for venous thromboembolism. Individuals who carry both a c.*97G>A variant in the F2 gene and Factor V Leiden have an approximately 20-fold increased risk for venous thromboembolism. Risks are likely to be even higher in more complex genotype combinations in                          volving the F2 c.*97G>A variant and Factor V Leiden (PMID: [id number]). Additional risk factors include but are not limited to: deficiency of protein C, protein S, or antithrombin III, age, female sex, personal or family history of deep vein thromboembolism, smoking, surgery, prolonged immobilization, malignant  neoplasm, tamoxifen treatment, raloxifene treatment, oral contraceptive use, hormone replacement therapy, and pregnancy. Management of thrombotic risk and thrombotic events should follow established guidelines and fit the clinical circumstance. This result cannot predict the occurrence or recurrence of a thrombotic event. Comment: Genetic counseling is recommended to discuss the potential clinical implications of positive results, as well as recommendations for testing family members. Genetic Coordinators are available for health care providers to discuss results at [phone]). Test Details: Variant Analyzed: c.1601G>A (p. Arg534Gln), referred to as Fact                          or V Leiden Methods/Limitations: DNA analysis of the F5 gene (NM_000130.5) was performed by PCR amplification followed by restriction enzyme analysis. The diagnostic sensitivity is >99%. Results must be combined with clinical information for the most accurate interpretation. Molecular- based testing is highly accurate, but as in any laboratory test, diagnostic errors may occur. False positive or false negative results may occur for reasons that include genetic variants, blood transfusions, bone marrow transplantation, somatic or tissue-specific mosaicism, mislabeled samples, or erroneous representation of family relationships. This test was developed and its performance characteristics determined by [facility]. It has not been cleared or approved by the Food and Drug Administration. References: [facility], [facility] [place] [employer]; ACMG Professional Practice and Guidelines Committee. Addendum: [place] consensus statement on fac  tor V Leiden mutation testing. Genet Med. 2021 Mar 5. doi: [id number]. PMID: [id number]. Kristopher Oppenheim. Factor V Leiden Thrombophilia. 1999 May 14 (Updated 2018 Jan 4). In: Tarri Glenn, Ardinger  HH, Pagon RA, et al., editors. GeneReviews(R) (Internet). [address] ([place]): [place] [employer] [place], [place]; 1993-2021. Available from: [email or web address] Terrilee Files, Carla Drape, Marin Shutter CS; ACMG Laboratory Quality Assurance Committee. Venous thromboembolism laboratory testing (factor V Leiden and factor II c.*97G>A), 2018 update: a technical standard of the [place] (ACMG). Genet Med. 2018 Dec;20(12):1489-1498. doi: [id number]. Epub 2018 Oct 5. PMID: [id number].    Reviewed By: 12/07/2022 Jane Canary, PhD   Final   Comment: (NOTE) Performed At: [facility] [address] [place], [place] [id number] Katina Degree MDPhD [id number]    Recommendations-PTGENE: 12/07/2022 Comment   Final   Comment: (NOTE) Result: c.*97G>A - Not Detected This result is not associated with an increased risk for venous thromboembolism. See Additional Clinical Information and Comments. Additional Clinical Information: Venous thromboembolism is a multifactorial disease influenced by genetic, environmental, and circumstantial risk factors. The c.*97G>A variant in the F2 gene is a genetic risk factor for venous thromboembolism. Heterozygous carriers have a 2- to 4-fold increased risk for venous thromboembolism. Homozygotes for the c.*97G>A variant are rare. The annual risk of VTE in homozygotes has been reported to be 1.1%/year. Individuals who carry both a c.*97G>A variant in the F2 gene and a c.1601G>A (p. Arg534Gln) variant in the F5 gene (commonly referred to as Factor V Leiden) have an approximately 20- fold increased risk for venous thromboembolism. Risks are likely to be even higher in more complex genotype combinations involving the F2 c.*97G>A variant and Factor V Leiden (PMID:                           [id number]). Additional risk factors include but are not limited to: deficiency of  protein C, protein S, or antithrombin III, age, female sex, personal or family history of deep vein thromboembolism, smoking, surgery, prolonged immobilization, malignant neoplasm, tamoxifen treatment, raloxifene treatment, oral contraceptive use, hormone replacement therapy, and pregnancy. Management of thrombotic risk and thrombotic events should follow established guidelines and fit the clinical circumstance. This result cannot predict the occurrence or recurrence of a thrombotic event. Comments: Genetic counseling is recommended to discuss the potential clinical implications of positive results, as well as recommendations for testing family members. Genetic Coordinators are available for health care providers to discuss results at [phone]). Test Details: Variant analyzed: c.*97G>A, previously referred to as G20210A Methods/Limitations: DNA analysis of the F2 gene (NM_000                          506.5) was performed by PCR amplification followed by restriction enzyme analysis. The diagnostic sensitivity is >99%. Results must be combined with clinical information for the most accurate interpretation. Molecular-based testing is highly accurate, but as in any laboratory test, diagnostic errors may occur. False positive or false negative results may occur for reasons that include genetic variants, blood transfusions, bone marrow transplantation, somatic or tissue-specific mosaicism, mislabeled samples, or erroneous representation of family relationships. This test was developed and its performance characteristics determined by [facility]. It has not been cleared or approved by the Food and Drug Administration. References: [facility], [facility]; ACMG Professional Practice and Guidelines Committee. Addendum: [employer]  of Medical Genetics consensus statement on factor V Leiden mutation testing. Genet Med. 2021 Mar 5. doi: [id number]                           [id number]. PMID: [id number]. Kristopher Oppenheim. Prothrombin Thrombophilia. 2006 Jul 25 [Updated 2021 Feb 4]. In: Tarri Glenn, Ardinger HH, Pagon RA, et al., editors. GeneReviews(R) [Internet]. [address] ([place]): [place] [employer] [place], [place]; 1993-2021. Available from: [email or web address]/ Terrilee Files, Carla Drape, Marin Shutter CS; ACMG Laboratory Quality Assurance Committee. Venous thromboembolism laboratory testing (factor V Leiden and factor II c.*97G>A), 2018 update: a technical standard of the [place] (ACMG). Genet Med. 2018 Dec;20(12):1489-1498. doi: [id number]. Epub 2018 Oct 5. PMID: IN:459269.    Reviewed by: 12/07/2022 Comment   Final   Comment: (NOTE) Earlean Polka, PhD Director, Molecular Genetics Performed At: [facility] [address] [place], [place] [id number] Katina Degree MDPhD [id number]    Fibrinogen 12/07/2022 497 (H)  210 - 475 mg/dL Final   Comment: (NOTE) Fibrinogen results may be underestimated in patients receiving thrombolytic therapy. Performed at [facility], [place] [address] [place], [place] [address]     RADIOGRAPHIC STUDIES: I have personally reviewed the radiological images as listed and agree with the findings in the report  No results found.  ASSESSMENT/PLAN  32 y.o. female is here because of  pulmonary embolism.  Medical history notable for anemia, chickenpox, postpartum hemorrhage  Pulmonary embolism  November 09, 2022: Presented to the emergency room with sharp pleuritic chest and upper back pain which began the previous day CTPA  RUL segmental and subsegmental pulmonary emboli. No right heart strain. Scattered tree-in-bud nodular opacities bilaterally, which may be infectious or inflammatory Patient had tested negative for Influenza and COVID a few days prior   By history this was an unprovoked  event  Therapy:  Continue full dose anticoagulation with Xarelto  VTE Risk factors  Obesity:  Generally considered to be a moderate risk factor for VTE leading to two- to five-fold increased risk of developing VTE, compared to those with normal BMI    The risk increases with increasing BMI: individuals with a BMI ?35 kg/m2 may present a six-fold greater risk than those with normal BMI.  Risk of VTE associated with obesity was highest in patients aged >50 years The interaction between obesity and another acquired risk factor almost doubles the risk of VTE. Obesity is associated with inactivity, raised intra-abdominal pressure, a chronic low-grade inflammatory state, impaired fibrinolysis, high levels of fibrinogen, von Willebrand factor and factor VIII, leading to a prothrombotic condition and elevated risk of VTE  (Reference:  Med Pharm Rep. 2020 Apr; 93(2): 162-168.)    Hypercoagulable state evaluation:  Given her young age and only one discernable risk factor will perform a hypercoagulable state evaluation Will obtain the following:  CBC with diff, CMP, PT, PTT, Factor V Leiden Gene, Prothrombin Gene, anti-beta2 glycolipoprotein, anticardiolipan antibody, d-dimer, ANA with reflex, RF, Hgb fractionation.  Can consider PCR for bcr-abl, JAK 2 with reflex, flow for PNH, FVIII level.   Because of anticoagulation with a direct thrombin inhibitor can not obtain Protein C, Protein S, ATIII activities or Lupus Anticoagulant as those are interfered with by these agents.     Cancer Staging  No matching staging information was found for the patient.   No problem-specific Assessment & Plan notes found for this encounter.  No orders of the defined types were placed in this encounter.   60 minutes was spent in patient care.  This included time spent preparing to see the patient (e.g., review of tests), obtaining and/or reviewing separately obtained history, counseling and educating the  patient/family/caregiver, ordering medications, tests, or procedures; documenting clinical information in the electronic or other health record, independently interpreting results and communicating results to the patient/family/caregiver as well as coordination of care.      All questions were answered. The patient knows to call the clinic with any problems, questions or concerns.  This note was electronically signed.    Barbee Cough, MD  01/04/2023 10:17 AM

## 2023-01-06 LAB — ANA COMPREHENSIVE PANEL
Anti JO-1: <0.2 AI (ref 0.0–0.9)
Centromere Ab Screen: <0.2 AI (ref 0.0–0.9)
Chromatin Ab SerPl-aCnc: <0.2 AI (ref 0.0–0.9)
ENA SM Ab Ser-aCnc: <0.2 AI (ref 0.0–0.9)
Ribonucleic Protein: 0.4 AI (ref 0.0–0.9)
SSA (Ro) (ENA) Antibody, IgG: <0.2 AI (ref 0.0–0.9)
SSB (La) (ENA) Antibody, IgG: <0.2 AI (ref 0.0–0.9)
Scleroderma (Scl-70) (ENA) Antibody, IgG: <0.2 AI (ref 0.0–0.9)
ds DNA Ab: <1 IU/mL (ref 0–9)

## 2023-01-06 LAB — RHEUMATOID FACTOR: Rheumatoid fact SerPl-aCnc: <10 IU/mL (ref <14.0)

## 2023-01-08 DIAGNOSIS — M7918 Myalgia, other site: Secondary | ICD-10-CM | POA: Insufficient documentation

## 2023-02-07 ENCOUNTER — Other Ambulatory Visit: Payer: Self-pay | Admitting: Oncology

## 2023-02-07 DIAGNOSIS — Z5181 Encounter for therapeutic drug level monitoring: Secondary | ICD-10-CM

## 2023-02-08 ENCOUNTER — Inpatient Hospital Stay: Payer: 59 | Attending: Oncology

## 2023-02-08 ENCOUNTER — Inpatient Hospital Stay (HOSPITAL_BASED_OUTPATIENT_CLINIC_OR_DEPARTMENT_OTHER): Payer: 59 | Admitting: Oncology

## 2023-02-08 ENCOUNTER — Other Ambulatory Visit: Payer: Self-pay

## 2023-02-08 ENCOUNTER — Encounter: Payer: Self-pay | Admitting: Oncology

## 2023-02-08 VITALS — BP 124/62 | HR 91 | Resp 18 | Wt 187.0 lb

## 2023-02-08 DIAGNOSIS — E669 Obesity, unspecified: Secondary | ICD-10-CM

## 2023-02-08 DIAGNOSIS — I2694 Multiple subsegmental pulmonary emboli without acute cor pulmonale: Secondary | ICD-10-CM

## 2023-02-08 DIAGNOSIS — D6859 Other primary thrombophilia: Secondary | ICD-10-CM

## 2023-02-08 DIAGNOSIS — I2693 Single subsegmental pulmonary embolism without acute cor pulmonale: Secondary | ICD-10-CM | POA: Insufficient documentation

## 2023-02-08 DIAGNOSIS — Z7901 Long term (current) use of anticoagulants: Secondary | ICD-10-CM | POA: Diagnosis not present

## 2023-02-08 DIAGNOSIS — Z5986 Financial insecurity: Secondary | ICD-10-CM | POA: Insufficient documentation

## 2023-02-08 DIAGNOSIS — M7918 Myalgia, other site: Secondary | ICD-10-CM

## 2023-02-08 LAB — CBC WITH DIFFERENTIAL/PLATELET
Abs Immature Granulocytes: 0.03 10*3/uL (ref 0.00–0.07)
Basophils Absolute: 0.1 10*3/uL (ref 0.0–0.1)
Basophils Relative: 1 %
Eosinophils Absolute: 0.1 10*3/uL (ref 0.0–0.5)
Eosinophils Relative: 1 %
HCT: 35.1 % — ABNORMAL LOW (ref 36.0–46.0)
Hemoglobin: 11.9 g/dL — ABNORMAL LOW (ref 12.0–15.0)
Immature Granulocytes: 0 %
Lymphocytes Relative: 32 %
Lymphs Abs: 2.4 10*3/uL (ref 0.7–4.0)
MCH: 31.4 pg (ref 26.0–34.0)
MCHC: 33.9 g/dL (ref 30.0–36.0)
MCV: 92.6 fL (ref 80.0–100.0)
Monocytes Absolute: 0.5 10*3/uL (ref 0.1–1.0)
Monocytes Relative: 7 %
Neutro Abs: 4.5 10*3/uL (ref 1.7–7.7)
Neutrophils Relative %: 59 %
Platelets: 309 10*3/uL (ref 150–400)
RBC: 3.79 MIL/uL — ABNORMAL LOW (ref 3.87–5.11)
RDW: 12.5 % (ref 11.5–15.5)
WBC: 7.6 10*3/uL (ref 4.0–10.5)
nRBC: 0 % (ref 0.0–0.2)

## 2023-02-08 LAB — COMPREHENSIVE METABOLIC PANEL
ALT: 10 U/L (ref 0–44)
AST: 12 U/L — ABNORMAL LOW (ref 15–41)
Albumin: 4.2 g/dL (ref 3.5–5.0)
Alkaline Phosphatase: 64 U/L (ref 38–126)
Anion gap: 5 (ref 5–15)
BUN: 9 mg/dL (ref 6–20)
CO2: 23 mmol/L (ref 22–32)
Calcium: 9.4 mg/dL (ref 8.9–10.3)
Chloride: 110 mmol/L (ref 98–111)
Creatinine, Ser: 0.67 mg/dL (ref 0.44–1.00)
GFR, Estimated: >60 mL/min (ref >60)
Glucose, Bld: 98 mg/dL (ref 70–99)
Potassium: 3.9 mmol/L (ref 3.5–5.1)
Sodium: 138 mmol/L (ref 135–145)
Total Bilirubin: 0.4 mg/dL (ref 0.3–1.2)
Total Protein: 7.3 g/dL (ref 6.5–8.1)

## 2023-02-08 LAB — D-DIMER, QUANTITATIVE: D-Dimer, Quant: 0.58 ug/mL-FEU — ABNORMAL HIGH (ref 0.00–0.50)

## 2023-02-08 NOTE — Progress Notes (Signed)
[facility] Cancer Follow up  Visit:  Patient Care Team: Loney Laurence, NP as PCP - General (Family Medicine)  CHIEF COMPLAINTS/PURPOSE OF CONSULTATION:  HISTORY OF PRESENTING ILLNESS: Rachel Sanders 32 y.o. female is here because of  pulmonary embolism  Medical history notable for anemia, chickenpox, postpartum hemorrhage  December 27, 2019: CT AP with contrast to evaluate abdominal pain, nausea emesis x 1 week.  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  No acute findings  February 01, 2022: CTPA no evidence of PE.  3 mm noncalcified lung nodule in posterior lateral aspect of right lower lobe  November 06, 2022: Presented to urgent care with URI which began that day.  Placed on Augmentin   Negative for influenza and COVID   November 09, 2022: Presented to the emergency room with sharp pleuritic chest and upper back pain which began the previous day  CTPA  Right upper lobe segmental and subsegmental pulmonary emboli. No evidence of right heart strain. Scattered tree-in-bud nodular opacities in the lungs bilaterally, which may be infectious or inflammatory  WBC 6.2 hemoglobin 12.5 platelet 242; 53 segs 37 lymphs 10 monos 1 EO D-dimer 0.75.  Beta-hCG negative CMP normal  Patient was placed on Xarelto and discharged home   December 07 2022:  [facility] Hematology Consult Patient had no prior history of VTE.  Patient is G3 P3 with last pregnancy in 2019.  Patient had postpartum hemorrhages with first and 2nd deliveries but not with third.  She states that on November 06 2022 she had fatigue, DOE on walking to bedroom to bathroom.  No LE pain or edema.    Social:  Works for [employer].  Sits much of the day but goes to the gym regularly.  Tobacco none.  EtOH on occasion.    [facility] Mother alive 54 well Father alive 30 well Sister alive 69 DM and Asthma  Sister alive 56 well Brother alive 25 well  Hemoglobin electrophoresis normal adult  pattern D-dimer undetectable.  Fibrinogen 497 INR 1.1 PTT 30 Factor V Leiden and prothrombin gene mutation testing negative Anticardiolipin antibody and antibeta 2 glycoprotein antibody negative  December 17, 2022: Patient referred to ED by PCP because patient was having pain and swelling in both legs, particularly ankles L> R Lower extremity Dopplers negative for DVT WBC 8.4 hemoglobin 11.6 MCV 94 platelet count 304; 51 segs 40 lymphs 7 monos 1 EO 1 basophil.   BMP notable for glucose of 103 troponin negative BNP 10.4  January 04 2023:  Notes increased bruising on legs.  No epistaxis, gingival bleeding.  No menses because on IUD.  Pain and swelling in legs are 5/10 for which she takes tylenol with some relief.  Not taking oral iron  Xarelto-  Neuromuscular & skeletal: Back pain (3%), limb pain (pediatric patients and adults: 2% to 7%),   CRP undetectable.  Sed rate 6.  D-dimer undetectable ANA panel and rheumatoid factor negative  February 08 2023:  Scheduled follow-up regarding pulmonary embolism.  Has been on Eliquis 5 mg bid since last visit.  Less musculoskeletal pain since the change from Xarelto to Eliquis.  Has more spotting on Eliquis.  Will check labs today.  Continue full dose Eliquis.  Not taking oral iron because she forgets.  Reviewed results of labs with patient  Hgb 11.9 D dimer 0.58  Review of Systems  Constitutional:  Negative for appetite change, chills, fatigue, fever and unexpected weight change.  HENT:  Negative for mouth sores, nosebleeds, sore throat, tinnitus, trouble swallowing and voice change.   Eyes:  Negative for eye problems and icterus.       Vision changes:  None  Respiratory:  Negative for chest tightness, cough, hemoptysis, shortness of breath and wheezing.        PND:  none Orthopnea:  none DOE:    Cardiovascular:  Negative for chest pain, leg swelling and palpitations.       PND:  none Orthopnea:  none  Gastrointestinal:  Negative for abdominal  distention, abdominal pain, blood in stool, constipation, diarrhea, nausea, rectal pain and vomiting.  Endocrine: Negative for hot flashes.       Cold intolerance:  none Heat intolerance:  none  Genitourinary:  Negative for bladder incontinence, difficulty urinating, dysuria, frequency, hematuria and nocturia.   Musculoskeletal:  Positive for back pain. Negative for arthralgias, gait problem, myalgias, neck pain and neck stiffness.  Skin:  Negative for itching, rash and wound.  Neurological:  Negative for dizziness, extremity weakness, gait problem, headaches, light-headedness, numbness and speech difficulty.  Hematological:  Negative for adenopathy. Does not bruise/bleed easily.  Psychiatric/Behavioral:  Negative for sleep disturbance and suicidal ideas. The patient is not nervous/anxious.     MEDICAL HISTORY: Past Medical History:  Diagnosis Date   Anemia    Blood transfusion affecting pregnancy    Chicken pox    as a child   PPH (postpartum hemorrhage)    last delivery and current delivery of 2016    SURGICAL HISTORY: Past Surgical History:  Procedure Laterality Date   NO PAST SURGERIES      SOCIAL HISTORY: Social History   Socioeconomic History   Marital status: Single    Spouse name: Not on file   Number of children: Not on file   Years of education: Some colle   Highest education level: Not on file  Occupational History   Not on file  Tobacco Use   Smoking status: Never   Smokeless tobacco: Never  Vaping Use   Vaping Use: Never used  Substance and Sexual Activity   Alcohol use: No    Alcohol/week: 0.0 standard drinks of alcohol   Drug use: No   Sexual activity: Yes    Birth control/protection: I.U.D.  Other Topics Concern   Not on file  Social History Narrative   Not on file   Social Determinants of Health   Financial Resource Strain: High Risk (12/07/2022)   Overall Financial Resource Strain (CARDIA)    Difficulty of Paying Living Expenses: Hard   Food Insecurity: Food Insecurity Present (12/07/2022)   Hunger Vital Sign    Worried About Running Out of Food in the Last Year: Sometimes true    Ran Out of Food in the Last Year: Sometimes true  Transportation Needs: No Transportation Needs (12/07/2022)   PRAPARE - Administrator, Civil Service (Medical): No    Lack of Transportation (Non-Medical): No  Physical Activity: Not on file  Stress: Not on file  Social Connections: Not on file  Intimate Partner Violence: Not At Risk (12/07/2022)   Humiliation, Afraid, Rape, and Kick questionnaire    Fear of Current or Ex-Partner: No    Emotionally Abused: No    Physically Abused: No    Sexually Abused: No    FAMILY HISTORY Family History  Problem Relation Age of Onset   Asthma Father    Asthma Sister     ALLERGIES:  has No Known Allergies.  MEDICATIONS:  Current Outpatient Medications  Medication Sig Dispense Refill   apixaban (ELIQUIS) 5 MG TABS tablet Take 1 tablet (5 mg total) by mouth 2 (two) times daily. 60 tablet 3   cyclobenzaprine (FLEXERIL) 5 MG tablet Take 1 tablet (5 mg total) by mouth 3 (three) times daily as needed for muscle spasms. 21 tablet 0   levonorgestrel (MIRENA, 52 MG,) 20 MCG/DAY IUD 1 applicator by Intrauterine route daily.     lidocaine (LIDODERM) 5 % Place 1 patch onto the skin daily. Remove & Discard patch within 12 hours or as directed by MD 10 patch 0   No current facility-administered medications for this visit.    PHYSICAL EXAMINATION:  ECOG PERFORMANCE STATUS: 0 - Asymptomatic   There were no vitals filed for this visit.   There were no vitals filed for this visit.    Physical Exam Vitals and nursing note reviewed.  Constitutional:      General: She is not in acute distress.    Appearance: Normal appearance. She is obese. She is not ill-appearing, toxic-appearing or diaphoretic.     Comments: Here alone  HENT:     Head: Normocephalic and atraumatic.     Right Ear: External  ear normal.     Left Ear: External ear normal.     Nose: Nose normal. No congestion or rhinorrhea.  Eyes:     General: No scleral icterus.    Extraocular Movements: Extraocular movements intact.     Conjunctiva/sclera: Conjunctivae normal.     Pupils: Pupils are equal, round, and reactive to light.  Cardiovascular:     Rate and Rhythm: Normal rate.     Heart sounds: No murmur heard.    No friction rub. No gallop.  Abdominal:     General: Bowel sounds are normal.     Palpations: Abdomen is soft.  Musculoskeletal:        General: No swelling, tenderness or deformity.     Cervical back: Normal range of motion and neck supple. No rigidity or tenderness.  Lymphadenopathy:     Head:     Right side of head: No submental, submandibular, tonsillar, preauricular, posterior auricular or occipital adenopathy.     Left side of head: No submental, submandibular, tonsillar, preauricular, posterior auricular or occipital adenopathy.     Cervical: No cervical adenopathy.     Right cervical: No superficial, deep or posterior cervical adenopathy.    Left cervical: No superficial, deep or posterior cervical adenopathy.     Upper Body:     Right upper body: No supraclavicular, axillary, pectoral or epitrochlear adenopathy.     Left upper body: No supraclavicular, axillary, pectoral or epitrochlear adenopathy.  Skin:    General: Skin is warm.     Coloration: Skin is not jaundiced.  Neurological:     General: No focal deficit present.     Mental Status: She is alert and oriented to person, place, and time. Mental status is at baseline.     Cranial Nerves: No cranial nerve deficit.  Psychiatric:        Mood and Affect: Mood normal.        Behavior: Behavior normal.        Thought Content: Thought content normal.        Judgment: Judgment normal.    LABORATORY DATA: I have personally reviewed the data as listed:  No visits with results within 1 Month(s) from this visit.  Latest known visit with  results is:  Appointment on 01/04/2023  Component Date Value Ref Range Status   Sed Rate 01/04/2023 6  0 - 22 mm/hr Final   Performed at [facility], [address] [place], [place] [address]   CRP 01/04/2023 <0.5  <1.0 mg/dL Final   Performed at [facility], [address] [place], [place] [address]   Rheumatoid fact SerPl-aCnc 01/04/2023 <10.0  <14.0 IU/mL Final   Comment: (NOTE) Performed At: [facility] [address] [place], [place] [id number] Jolene Schimke MD [id number]    ds DNA Ab 01/04/2023 <1  0 - 9 IU/mL Final   Comment: (NOTE)                                   Negative      <5                                   Equivocal  5 - 9                                   Positive      >9    Ribonucleic Protein 01/04/2023 0.4  0.0 - 0.9 AI Final   ENA SM Ab Ser-aCnc 01/04/2023 <0.2  0.0 - 0.9 AI Final   Scleroderma (Scl-70) (ENA) Antibod* 01/04/2023 <0.2  0.0 - 0.9 AI Final   SSA (Ro) (ENA) Antibody, IgG 01/04/2023 <0.2  0.0 - 0.9 AI Final   SSB (La) (ENA) Antibody, IgG 01/04/2023 <0.2  0.0 - 0.9 AI Final   Chromatin Ab SerPl-aCnc 01/04/2023 <0.2  0.0 - 0.9 AI Final   Anti JO-1 01/04/2023 <0.2  0.0 - 0.9 AI Final   Centromere Ab Screen 01/04/2023 <0.2  0.0 - 0.9 AI Final   See below: 01/04/2023 Comment   Final   Comment: (NOTE) Autoantibody                       Disease Association ------------------------------------------------------------                        Condition                  Frequency ---------------------   ------------------------   --------- Antinuclear Antibody,    SLE, mixed connective Direct (ANA-D)           tissue diseases ---------------------   ------------------------   --------- dsDNA                    SLE                        40 - 60% ---------------------   ------------------------   --------- Chromatin                Drug induced SLE                90%                         SLE                         48 - 97% ---------------------   ------------------------   ---------  SSA (Ro)                 SLE                        25 - 35%                         Sjogren's Syndrome         40 - 70%                         Neonatal Lupus                 100% ---------------------   ------------------------   --------- SSB (La)                 SLE                                                       10%                         Sjogren's Syndrome              30% ---------------------   -----------------------    --------- Sm (anti-Smith)          SLE                        15 - 30% ---------------------   -----------------------    --------- RNP                      Mixed Connective Tissue                         Disease                         95% (U1 nRNP,                SLE                        30 - 50% anti-ribonucleoprotein)  Polymyositis and/or                         Dermatomyositis                 20% ---------------------   ------------------------   --------- Scl-70 (antiDNA          Scleroderma (diffuse)      20 - 35% topoisomerase)           Crest                           13% ---------------------   ------------------------   --------- Jo-1                     Polymyositis and/or                         Dermatomyositis            20 -  40% ---------------------   ------------------------   --------- Centromere B             Scleroderma -                           Crest                         variant                         80% Performed At: [facility] [place] [address] [place], [place] [id number] Jolene Schimke MD [id number]     RADIOGRAPHIC STUDIES: I have personally reviewed the radiological images as listed and agree with the findings in the report  No results found.  ASSESSMENT/PLAN  32 y.o. female is here because of  pulmonary embolism.  Medical history notable for anemia, chickenpox, postpartum hemorrhage  Pulmonary embolism  November 09, 2022:  Presented to the emergency room with sharp pleuritic chest and upper back pain which began the previous day CTPA  RUL segmental and subsegmental pulmonary emboli. No right heart strain. Scattered tree-in-bud nodular opacities bilaterally, which may be infectious or inflammatory Patient had tested negative for Influenza and COVID a few days prior   By history this was an unprovoked event  Therapy:   December 07 2022- Continue full dose anticoagulation with Xarelto January 04 2023- Having arthralgias and myalgias which began after starting Xarelto.  Will switch to Eliquis 5 mg bid ANA panel, RF, ESR and CRP unremarkable.   February 08 2023- Tolerating Eliquis well.  D dimer elevated this visit.  Continuing full dose anticoagulation.  Will follow.   VTE Risk factors  Obesity:  Generally considered to be a moderate risk factor for VTE leading to two- to five-fold increased risk of developing VTE, compared to those with normal BMI    The risk increases with increasing BMI: individuals with a BMI ?35 kg/m2 may present a six-fold greater risk than those with normal BMI.  Risk of VTE associated with obesity was highest in patients aged >50 years The interaction between obesity and another acquired risk factor almost doubles the risk of VTE. Obesity is associated with inactivity, raised intra-abdominal pressure, a chronic low-grade inflammatory state, impaired fibrinolysis, high levels of fibrinogen, von Willebrand factor and factor VIII, leading to a prothrombotic condition and elevated risk of VTE  (Reference:  Med Pharm Rep. 2020 Apr; 93(2): 162-168.)    Hypercoagulable state evaluation:  Given her young age and only one discernable risk factor performed  a hypercoagulable state evaluation  December 07 2022- Hemoglobin electrophoresis normal adult pattern D-dimer undetectable.  Fibrinogen 497 INR 1.1 PTT 30 Factor V Leiden and prothrombin gene mutation testing negative Anticardiolipin antibody and antibeta  2 glycoprotein antibody negative Because of anticoagulation with a direct thrombin inhibitor can not obtain Protein C, Protein S, ATIII activities or Lupus Anticoagulant as those are interfered with by these agents.    February 08 2023- Will obtain flow for PNH.  In the future consider FISH for bcr-abl, JAK 2 with reflex, FVIII level.     Cancer Staging  No matching staging information was found for the patient.   No problem-specific Assessment & Plan notes found for this encounter.   No orders of the defined types were placed in this encounter.   30 minutes was spent in patient care.  This included time spent preparing to  see the patient (e.g., review of tests), obtaining and/or reviewing separately obtained history, counseling and educating the patient ordering medications, tests, or procedures; documenting clinical information in the electronic or other health record, independently interpreting results and communicating results to the patient as well as coordination of care.      All questions were answered. The patient knows to call the clinic with any problems, questions or concerns.  This note was electronically signed.    Loni Muse, MD  02/08/2023 12:58 PM

## 2023-02-08 NOTE — Patient Instructions (Signed)
Please begin taking an iron supplement daily.  This can be Flintstones vitamin with iron, Ashby Dawes made iron or Solgar gentle iron.  This should help keep you from becoming anemic

## 2023-02-11 ENCOUNTER — Telehealth: Payer: Self-pay | Admitting: Oncology

## 2023-02-13 ENCOUNTER — Ambulatory Visit: Payer: 59

## 2023-02-13 ENCOUNTER — Ambulatory Visit
Admission: RE | Admit: 2023-02-13 | Discharge: 2023-02-13 | Disposition: A | Discharge status: Home / Self Care | Payer: 59 | Source: Ambulatory Visit | Attending: Family Medicine | Admitting: Family Medicine

## 2023-02-13 ENCOUNTER — Other Ambulatory Visit: Payer: Self-pay | Admitting: Family Medicine

## 2023-02-13 DIAGNOSIS — N644 Mastodynia: Secondary | ICD-10-CM

## 2023-02-13 DIAGNOSIS — R921 Mammographic calcification found on diagnostic imaging of breast: Secondary | ICD-10-CM

## 2023-02-18 LAB — PNH PROFILE (-HIGH SENSITIVITY)

## 2023-05-02 ENCOUNTER — Other Ambulatory Visit: Payer: Self-pay | Admitting: Oncology

## 2023-05-02 DIAGNOSIS — Z7901 Long term (current) use of anticoagulants: Secondary | ICD-10-CM

## 2023-05-02 NOTE — Progress Notes (Deleted)
[facility] Cancer Follow up  Visit:  Patient Care Team: Loney Laurence, NP as PCP - General (Family Medicine)  CHIEF COMPLAINTS/PURPOSE OF CONSULTATION:  HISTORY OF PRESENTING ILLNESS: Rachel Sanders 32 y.o. female is here because of  pulmonary embolism  Medical history notable for anemia, chickenpox, postpartum hemorrhage  December 27, 2019: CT AP with contrast to evaluate abdominal pain, nausea emesis x 1 week.  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  No acute findings  February 01, 2022: CTPA no evidence of PE.  3 mm noncalcified lung nodule in posterior lateral aspect of right lower lobe  November 06, 2022: Presented to urgent care with URI which began that day.  Placed on Augmentin   Negative for influenza and COVID   November 09, 2022: Presented to the emergency room with sharp pleuritic chest and upper back pain which began the previous day  CTPA  Right upper lobe segmental and subsegmental pulmonary emboli. No evidence of right heart strain. Scattered tree-in-bud nodular opacities in the lungs bilaterally, which may be infectious or inflammatory  WBC 6.2 hemoglobin 12.5 platelet 242; 53 segs 37 lymphs 10 monos 1 EO D-dimer 0.75.  Beta-hCG negative CMP normal  Patient was placed on Xarelto and discharged home   December 07 2022:  [facility] Hematology Consult Patient had no prior history of VTE.  Patient is G3 P3 with last pregnancy in 2019.  Patient had postpartum hemorrhages with first and 2nd deliveries but not with third.  She states that on November 06 2022 she had fatigue, DOE on walking to bedroom to bathroom.  No LE pain or edema.    Social:  Works for [employer].  Sits much of the day but goes to the gym regularly.  Tobacco none.  EtOH on occasion.    [facility] Mother alive 61 well Father alive 38 well Sister alive 28 DM and Asthma  Sister alive 7 well Brother alive 48 well  Hemoglobin electrophoresis normal adult  pattern D-dimer undetectable.  Fibrinogen 497 INR 1.1 PTT 30 Factor V Leiden and prothrombin gene mutation testing negative Anticardiolipin antibody and antibeta 2 glycoprotein antibody negative  December 17, 2022: Patient referred to ED by PCP because patient was having pain and swelling in both legs, particularly ankles L> R Lower extremity Dopplers negative for DVT WBC 8.4 hemoglobin 11.6 MCV 94 platelet count 304; 51 segs 40 lymphs 7 monos 1 EO 1 basophil.   BMP notable for glucose of 103 troponin negative BNP 10.4  January 04 2023:  Notes increased bruising on legs.  No epistaxis, gingival bleeding.  No menses because on IUD.  Pain and swelling in legs are 5/10 for which she takes tylenol with some relief.  Not taking oral iron  Xarelto-  Neuromuscular & skeletal: Back pain (3%), limb pain (pediatric patients and adults: 2% to 7%),   CRP undetectable.  Sed rate 6.  D-dimer undetectable ANA panel and rheumatoid factor negative  February 08 2023:    Has been on Eliquis 5 mg bid since last visit.  Less musculoskeletal pain since the change from Xarelto to Eliquis.  Has more spotting on Eliquis.  Will check labs today.  Continue full dose Eliquis.  Not taking oral iron because she forgets.  Reviewed results of labs with patient  Hgb 11.9 D dimer 0.58  May 03 2023:  Scheduled follow-up regarding pulmonary embolism.  Review of Systems  Constitutional:  Negative for appetite change, chills, fatigue, fever  and unexpected weight change.  HENT:   Negative for mouth sores, nosebleeds, sore throat, tinnitus, trouble swallowing and voice change.   Eyes:  Negative for eye problems and icterus.       Vision changes:  None  Respiratory:  Negative for chest tightness, cough, hemoptysis, shortness of breath and wheezing.        PND:  none Orthopnea:  none DOE:    Cardiovascular:  Negative for chest pain, leg swelling and palpitations.       PND:  none Orthopnea:  none  Gastrointestinal:  Negative  for abdominal distention, abdominal pain, blood in stool, constipation, diarrhea, nausea, rectal pain and vomiting.  Endocrine: Negative for hot flashes.       Cold intolerance:  none Heat intolerance:  none  Genitourinary:  Negative for bladder incontinence, difficulty urinating, dysuria, frequency, hematuria and nocturia.   Musculoskeletal:  Positive for back pain. Negative for arthralgias, gait problem, myalgias, neck pain and neck stiffness.  Skin:  Negative for itching, rash and wound.  Neurological:  Negative for dizziness, extremity weakness, gait problem, headaches, light-headedness, numbness and speech difficulty.  Hematological:  Negative for adenopathy. Does not bruise/bleed easily.  Psychiatric/Behavioral:  Negative for sleep disturbance and suicidal ideas. The patient is not nervous/anxious.     MEDICAL HISTORY: Past Medical History:  Diagnosis Date   Anemia    Blood transfusion affecting pregnancy    Chicken pox    as a child   PPH (postpartum hemorrhage)    last delivery and current delivery of 2016    SURGICAL HISTORY: Past Surgical History:  Procedure Laterality Date   NO PAST SURGERIES      SOCIAL HISTORY: Social History   Socioeconomic History   Marital status: Single    Spouse name: Not on file   Number of children: Not on file   Years of education: Some colle   Highest education level: Not on file  Occupational History   Not on file  Tobacco Use   Smoking status: Never   Smokeless tobacco: Never  Vaping Use   Vaping status: Never Used  Substance and Sexual Activity   Alcohol use: No    Alcohol/week: 0.0 standard drinks of alcohol   Drug use: No   Sexual activity: Yes    Birth control/protection: I.U.D.  Other Topics Concern   Not on file  Social History Narrative   Not on file   Social Determinants of Health   Financial Resource Strain: High Risk (12/07/2022)   Overall Financial Resource Strain (CARDIA)    Difficulty of Paying Living  Expenses: Hard  Food Insecurity: Food Insecurity Present (12/07/2022)   Hunger Vital Sign    Worried About Running Out of Food in the Last Year: Sometimes true    Ran Out of Food in the Last Year: Sometimes true  Transportation Needs: No Transportation Needs (12/07/2022)   PRAPARE - Administrator, Civil Service (Medical): No    Lack of Transportation (Non-Medical): No  Physical Activity: Not on file  Stress: Not on file  Social Connections: Unknown (03/06/2022)   Received from [facility]   Social Network    Social Network: Not on file  Intimate Partner Violence: Not At Risk (12/07/2022)   Humiliation, Afraid, Rape, and Kick questionnaire    Fear of Current or Ex-Partner: No    Emotionally Abused: No    Physically Abused: No    Sexually Abused: No    FAMILY HISTORY Family History  Problem Relation Age of Onset   Asthma Father    Asthma Sister     ALLERGIES:  has No Known Allergies.  MEDICATIONS:  Current Outpatient Medications  Medication Sig Dispense Refill   apixaban (ELIQUIS) 5 MG TABS tablet Take 1 tablet (5 mg total) by mouth 2 (two) times daily. 60 tablet 3   cyclobenzaprine (FLEXERIL) 5 MG tablet Take 1 tablet (5 mg total) by mouth 3 (three) times daily as needed for muscle spasms. 21 tablet 0   levonorgestrel (MIRENA, 52 MG,) 20 MCG/DAY IUD 1 applicator by Intrauterine route daily.     lidocaine (LIDODERM) 5 % Place 1 patch onto the skin daily. Remove & Discard patch within 12 hours or as directed by MD 10 patch 0   No current facility-administered medications for this visit.    PHYSICAL EXAMINATION:  ECOG PERFORMANCE STATUS: 0 - Asymptomatic   There were no vitals filed for this visit.   There were no vitals filed for this visit.    Physical Exam Vitals and nursing note reviewed.  Constitutional:      General: She is not in acute distress.    Appearance: Normal appearance. She is obese. She is not ill-appearing,  toxic-appearing or diaphoretic.     Comments: Here alone  HENT:     Head: Normocephalic and atraumatic.     Right Ear: External ear normal.     Left Ear: External ear normal.     Nose: Nose normal. No congestion or rhinorrhea.  Eyes:     General: No scleral icterus.    Extraocular Movements: Extraocular movements intact.     Conjunctiva/sclera: Conjunctivae normal.     Pupils: Pupils are equal, round, and reactive to light.  Cardiovascular:     Rate and Rhythm: Normal rate.     Heart sounds: No murmur heard.    No friction rub. No gallop.  Abdominal:     General: Bowel sounds are normal.     Palpations: Abdomen is soft.  Musculoskeletal:        General: No swelling, tenderness or deformity.     Cervical back: Normal range of motion and neck supple. No rigidity or tenderness.  Lymphadenopathy:     Head:     Right side of head: No submental, submandibular, tonsillar, preauricular, posterior auricular or occipital adenopathy.     Left side of head: No submental, submandibular, tonsillar, preauricular, posterior auricular or occipital adenopathy.     Cervical: No cervical adenopathy.     Right cervical: No superficial, deep or posterior cervical adenopathy.    Left cervical: No superficial, deep or posterior cervical adenopathy.     Upper Body:     Right upper body: No supraclavicular, axillary, pectoral or epitrochlear adenopathy.     Left upper body: No supraclavicular, axillary, pectoral or epitrochlear adenopathy.  Skin:    General: Skin is warm.     Coloration: Skin is not jaundiced.  Neurological:     General: No focal deficit present.     Mental Status: She is alert and oriented to person, place, and time. Mental status is at baseline.     Cranial Nerves: No cranial nerve deficit.  Psychiatric:        Mood and Affect: Mood normal.        Behavior: Behavior normal.        Thought Content: Thought content normal.        Judgment: Judgment normal.    LABORATORY DATA: I  have personally  reviewed the data as listed:  No visits with results within 1 Month(s) from this visit.  Latest known visit with results is:  Appointment on 02/08/2023  Component Date Value Ref Range Status   Interpretation 02/08/2023 Comment   Final   Comment: See Scanned report in [facility] (NOTE) Peripheral Blood: No evidence of paroxysmal nocturnal hemoglobinuria (PN[facility]) DISCLAIMER: REFER TO HARDCOPY OR PDF FOR COMPLETE RESULT. If synopsis provided, clinical decisions should not be based on this interfaced synopsis alone. Performed At: ;# [facility] [address] [place], [place] [id number] Donnald Garre MD EX:[id number] Performed at [facility], [address], [place], [place] [address]    Sodium 02/08/2023 138  135 - 145 mmol/L Final   Potassium 02/08/2023 3.9  3.5 - 5.1 mmol/L Final   Chloride 02/08/2023 110  98 - 111 mmol/L Final   CO2 02/08/2023 23  22 - 32 mmol/L Final   Glucose, Bld 02/08/2023 98  70 - 99 mg/dL Final   Glucose reference range applies only to samples taken after fasting for at least 8 hours.   BUN 02/08/2023 9  6 - 20 mg/dL Final   Creatinine, Ser 02/08/2023 0.67  0.44 - 1.00 mg/dL Final   Calcium 53/66/4403 9.4  8.9 - 10.3 mg/dL Final   Total Protein 47/42/5956 7.3  6.5 - 8.1 g/dL Final   Albumin 38/75/6433 4.2  3.5 - 5.0 g/dL Final   AST 29/51/8841 12 (L)  15 - 41 U/L Final   ALT 02/08/2023 10  0 - 44 U/L Final   Alkaline Phosphatase 02/08/2023 64  38 - 126 U/L Final   Total Bilirubin 02/08/2023 0.4  0.3 - 1.2 mg/dL Final   GFR, Estimated 02/08/2023 >60  >60 mL/min Final   Comment: (NOTE) Calculated using the CKD-EPI Creatinine Equation (2021)    Anion gap 02/08/2023 5  5 - 15 Final   Performed at [facility], [address] [place], [place] [address]   WBC 02/08/2023 7.6  4.0 - 10.5 K/uL Final   RBC 02/08/2023 3.79 (L)  3.87 - 5.11 MIL/uL Final    Hemoglobin 02/08/2023 11.9 (L)  12.0 - 15.0 g/dL Final   HCT 01/60/1093 35.1 (L)  36.0 - 46.0 % Final   MCV 02/08/2023 92.6  80.0 - 100.0 fL Final   MCH 02/08/2023 31.4  26.0 - 34.0 pg Final   MCHC 02/08/2023 33.9  30.0 - 36.0 g/dL Final   RDW 23/55/7322 12.5  11.5 - 15.5 % Final   Platelets 02/08/2023 309  150 - 400 K/uL Final   nRBC 02/08/2023 0.0  0.0 - 0.2 % Final   Neutrophils Relative % 02/08/2023 59  % Final   Neutro Abs 02/08/2023 4.5  1.7 - 7.7 K/uL Final   Lymphocytes Relative 02/08/2023 32  % Final   Lymphs Abs 02/08/2023 2.4  0.7 - 4.0 K/uL Final   Monocytes Relative 02/08/2023 7  % Final   Monocytes Absolute 02/08/2023 0.5  0.1 - 1.0 K/uL Final   Eosinophils Relative 02/08/2023 1  % Final   Eosinophils Absolute 02/08/2023 0.1  0.0 - 0.5 K/uL Final   Basophils Relative 02/08/2023 1  % Final   Basophils Absolute 02/08/2023 0.1  0.0 - 0.1 K/uL Final   Immature Granulocytes 02/08/2023 0  % Final   Abs Immature Granulocytes 02/08/2023 0.03  0.00 - 0.07 K/uL Final   Performed at [facility], [address], [place], [place] [address]   D-Dimer, Quant 02/08/2023 0.58 (H)  0.00 - 0.50 ug/mL-FEU Final   Comment: (NOTE) At the manufacturer cut-off value of 0.5 g/mL FEU, this assay has a negative predictive value of 95-100%.This assay is intended for use in conjunction with a clinical pretest probability (PTP) assessment model to exclude pulmonary embolism (PE) and deep venous thrombosis (DVT) in outpatients suspected of PE or DVT. Results should be correlated with clinical presentation. Performed at [facility], [address], [place], [place] [address]     RADIOGRAPHIC STUDIES: I have personally reviewed the radiological images as listed and agree with the findings in the report  No results found.  ASSESSMENT/PLAN  32 y.o. female is here because of  pulmonary embolism.  Medical history notable for anemia, chickenpox,  postpartum hemorrhage  Pulmonary embolism  November 09, 2022: Presented to the emergency room with sharp pleuritic chest and upper back pain which began the previous day CTPA  RUL segmental and subsegmental pulmonary emboli. No right heart strain. Scattered tree-in-bud nodular opacities bilaterally, which may be infectious or inflammatory Patient had tested negative for Influenza and COVID a few days prior   By history this was an unprovoked event  Therapy:   December 07 2022- Continue full dose anticoagulation with Xarelto January 04 2023- Having arthralgias and myalgias which began after starting Xarelto.  Will switch to Eliquis 5 mg bid ANA panel, RF, ESR and CRP unremarkable.   February 08 2023- Tolerating Eliquis well.  D dimer elevated this visit.  Continuing full dose anticoagulation.  Will follow.   VTE Risk factors  Obesity:  Generally considered to be a moderate risk factor for VTE leading to two- to five-fold increased risk of developing VTE, compared to those with normal BMI    The risk increases with increasing BMI: individuals with a BMI ?35 kg/m2 may present a six-fold greater risk than those with normal BMI.  Risk of VTE associated with obesity was highest in patients aged >50 years The interaction between obesity and another acquired risk factor almost doubles the risk of VTE. Obesity is associated with inactivity, raised intra-abdominal pressure, a chronic low-grade inflammatory state, impaired fibrinolysis, high levels of fibrinogen, von Willebrand factor and factor VIII, leading to a prothrombotic condition and elevated risk of VTE  (Reference:  Med Pharm Rep. 2020 Apr; 93(2): 162-168.)    Hypercoagulable state evaluation:  Given her young age and only one discernable risk factor performed  a hypercoagulable state evaluation  December 07 2022- Hemoglobin electrophoresis normal adult pattern D-dimer undetectable.  Fibrinogen 497 INR 1.1 PTT 30 Factor V Leiden and prothrombin gene  mutation testing negative Anticardiolipin antibody and antibeta 2 glycoprotein antibody negative Because of anticoagulation with a direct thrombin inhibitor can not obtain Protein C, Protein S, ATIII activities or Lupus Anticoagulant as those are interfered with by these agents.    February 08 2023- Will obtain flow for PNH.  In the future consider FISH for bcr-abl, JAK 2 with reflex, FVIII level.     Cancer Staging  No matching staging information was found for the patient.    No problem-specific Assessment & Plan notes found for this encounter.   No orders of the defined types were placed in this encounter.   30 minutes was spent in patient care.  This included time spent preparing to see the patient (e.g., review of tests), obtaining and/or reviewing separately obtained history, counseling and educating the patient ordering medications, tests, or procedures;  documenting clinical information in the electronic or other health record, independently interpreting results and communicating results to the patient as well as coordination of care.      All questions were answered. The patient knows to call the clinic with any problems, questions or concerns.  This note was electronically signed.    Loni Muse, MD  05/02/2023 12:34 PM

## 2023-05-03 ENCOUNTER — Inpatient Hospital Stay: Payer: 59 | Admitting: Oncology

## 2023-05-03 ENCOUNTER — Inpatient Hospital Stay: Payer: 59 | Attending: Oncology

## 2023-07-09 ENCOUNTER — Emergency Department (HOSPITAL_BASED_OUTPATIENT_CLINIC_OR_DEPARTMENT_OTHER): Payer: 59

## 2023-07-09 ENCOUNTER — Emergency Department (HOSPITAL_BASED_OUTPATIENT_CLINIC_OR_DEPARTMENT_OTHER)
Admission: EM | Admit: 2023-07-09 | Discharge: 2023-07-09 | Disposition: A | Discharge status: Home / Self Care | Payer: 59 | Attending: Emergency Medicine | Admitting: Emergency Medicine

## 2023-07-09 DIAGNOSIS — X58XXXA Exposure to other specified factors, initial encounter: Secondary | ICD-10-CM | POA: Diagnosis not present

## 2023-07-09 DIAGNOSIS — S39012A Strain of muscle, fascia and tendon of lower back, initial encounter: Secondary | ICD-10-CM | POA: Insufficient documentation

## 2023-07-09 DIAGNOSIS — Z79899 Other long term (current) drug therapy: Secondary | ICD-10-CM | POA: Diagnosis not present

## 2023-07-09 DIAGNOSIS — R519 Headache, unspecified: Secondary | ICD-10-CM | POA: Insufficient documentation

## 2023-07-09 DIAGNOSIS — R079 Chest pain, unspecified: Secondary | ICD-10-CM | POA: Diagnosis present

## 2023-07-09 DIAGNOSIS — Z7901 Long term (current) use of anticoagulants: Secondary | ICD-10-CM | POA: Diagnosis not present

## 2023-07-09 LAB — BASIC METABOLIC PANEL
Anion gap: 7 (ref 5–15)
BUN: 15 mg/dL (ref 6–20)
CO2: 23 mmol/L (ref 22–32)
Calcium: 9.1 mg/dL (ref 8.9–10.3)
Chloride: 107 mmol/L (ref 98–111)
Creatinine, Ser: 0.78 mg/dL (ref 0.44–1.00)
GFR, Estimated: >60 mL/min (ref >60)
Glucose, Bld: 99 mg/dL (ref 70–99)
Potassium: 3.8 mmol/L (ref 3.5–5.1)
Sodium: 137 mmol/L (ref 135–145)

## 2023-07-09 LAB — TROPONIN I (HIGH SENSITIVITY)
Troponin I (High Sensitivity): 2 ng/L (ref <18)
Troponin I (High Sensitivity): <2 ng/L (ref <18)

## 2023-07-09 LAB — URINALYSIS, ROUTINE W REFLEX MICROSCOPIC
Bilirubin Urine: NEGATIVE
Glucose, UA: NEGATIVE mg/dL
Hgb urine dipstick: NEGATIVE
Ketones, ur: NEGATIVE mg/dL
Leukocytes,Ua: NEGATIVE
Nitrite: NEGATIVE
Protein, ur: NEGATIVE mg/dL
Specific Gravity, Urine: 1.02 (ref 1.005–1.030)
pH: 6.5 (ref 5.0–8.0)

## 2023-07-09 LAB — HEPATIC FUNCTION PANEL
ALT: 11 U/L (ref 0–44)
AST: 12 U/L — ABNORMAL LOW (ref 15–41)
Albumin: 4.3 g/dL (ref 3.5–5.0)
Alkaline Phosphatase: 50 U/L (ref 38–126)
Bilirubin, Direct: <0.1 mg/dL (ref 0.0–0.2)
Total Bilirubin: 0.4 mg/dL (ref 0.3–1.2)
Total Protein: 7.4 g/dL (ref 6.5–8.1)

## 2023-07-09 LAB — CBC
HCT: 35.6 % — ABNORMAL LOW (ref 36.0–46.0)
Hemoglobin: 12.1 g/dL (ref 12.0–15.0)
MCH: 31.4 pg (ref 26.0–34.0)
MCHC: 34 g/dL (ref 30.0–36.0)
MCV: 92.5 fL (ref 80.0–100.0)
Platelets: 281 10*3/uL (ref 150–400)
RBC: 3.85 MIL/uL — ABNORMAL LOW (ref 3.87–5.11)
RDW: 12.4 % (ref 11.5–15.5)
WBC: 7.5 10*3/uL (ref 4.0–10.5)
nRBC: 0 % (ref 0.0–0.2)

## 2023-07-09 LAB — D-DIMER, QUANTITATIVE: D-Dimer, Quant: <0.27 ug{FEU}/mL (ref 0.00–0.50)

## 2023-07-09 LAB — PREGNANCY, URINE: Preg Test, Ur: NEGATIVE

## 2023-07-09 MED ORDER — ACETAMINOPHEN 325 MG PO TABS
650.0000 mg | ORAL_TABLET | Freq: Once | ORAL | Status: AC
  Administered 2023-07-09: 650 mg via ORAL

## 2023-07-09 MED ORDER — KETOROLAC TROMETHAMINE 30 MG/ML IJ SOLN
30.0000 mg | Freq: Once | INTRAMUSCULAR | Status: AC
  Administered 2023-07-09: 30 mg via INTRAVENOUS
  Filled 2023-07-09: qty 1

## 2023-07-09 MED ORDER — METHOCARBAMOL 500 MG PO TABS: 500.0000 mg | ORAL_TABLET | Freq: Two times a day (BID) | ORAL | 0 refills | Status: DC | PRN

## 2023-07-09 MED ORDER — ACETAMINOPHEN 325 MG PO TABS
ORAL_TABLET | ORAL | Status: AC
  Filled 2023-07-09: qty 2

## 2023-07-09 MED ORDER — SODIUM CHLORIDE 0.9 % IV BOLUS
1000.0000 mL | Freq: Once | INTRAVENOUS | Status: AC
  Administered 2023-07-09: 1000 mL via INTRAVENOUS

## 2023-07-09 NOTE — ED Notes (Signed)
Pt aware of the need for a urine... Unable to currently provide the sample.Marland KitchenMarland Kitchen

## 2023-07-09 NOTE — ED Triage Notes (Signed)
HA x1 week. Taking tylenol daily no relief. Central CP and between shoulders. Denies SOB. Also reports lower back tightness. Currently taking eliquis for PE in January.

## 2023-07-09 NOTE — ED Notes (Signed)
RN reviewed discharge instructions with pt. Pt verbalized understanding and had no further questions. VSS upon discharge.

## 2023-07-09 NOTE — ED Provider Notes (Signed)
[place] EMERGENCY DEPARTMENT AT [facility] Provider Note   CSN: [id number] Arrival date & time: 07/09/23  1933     History  Chief Complaint  Patient presents with   Chest Pain    Rachel Sanders is a 32 y.o. female.  Pt is a 32 yo female with pmhx significant for PE and anemia.  Pt has been compliant with her Eliquis.  She has a headache, low back pain, and some chest pain.  Pt denies fevers.         Home Medications Prior to Admission medications   Medication Sig Start Date End Date Taking? Authorizing Provider  methocarbamol (ROBAXIN) 500 MG tablet Take 1 tablet (500 mg total) by mouth 2 (two) times daily as needed for muscle spasms. 07/09/23  Yes Jacalyn Lefevre, MD  apixaban (ELIQUIS) 5 MG TABS tablet Take 1 tablet (5 mg total) by mouth 2 (two) times daily. 01/04/23 05/04/23  Loni Muse, MD  cyclobenzaprine (FLEXERIL) 5 MG tablet Take 1 tablet (5 mg total) by mouth 3 (three) times daily as needed for muscle spasms. 11/08/22   Claudie Leach, DO  levonorgestrel (MIRENA, 52 MG,) 20 MCG/DAY IUD 1 applicator by Intrauterine route daily.    [provider]  lidocaine (LIDODERM) 5 % Place 1 patch onto the skin daily. Remove & Discard patch within 12 hours or as directed by MD 11/08/22   Claudie Leach, DO      Allergies    Patient has no known allergies.    Review of Systems   Review of Systems  Cardiovascular:  Positive for chest pain.  Musculoskeletal:  Positive for back pain.  Neurological:  Positive for headaches.  All other systems reviewed and are negative.   Physical Exam Updated Vital Signs BP (!) 133/96 (BP Location: Left Arm)   Pulse 87   Temp 98.7 F (37.1 C) (Oral)   Resp 19   Wt 80.3 kg   SpO2 97%   BMI 29.45 kg/m  Physical Exam Vitals and nursing note reviewed.  Constitutional:      Appearance: She is well-developed.  HENT:     Head: Normocephalic and atraumatic.  Eyes:     Extraocular Movements: Extraocular  movements intact.     Pupils: Pupils are equal, round, and reactive to light.  Cardiovascular:     Rate and Rhythm: Normal rate and regular rhythm.     Heart sounds: Normal heart sounds.  Pulmonary:     Effort: Pulmonary effort is normal.     Breath sounds: Normal breath sounds.  Abdominal:     General: Bowel sounds are normal.     Palpations: Abdomen is soft.  Musculoskeletal:        General: Normal range of motion.     Cervical back: Normal range of motion and neck supple.  Skin:    General: Skin is warm.  Neurological:     General: No focal deficit present.     Mental Status: She is alert and oriented to person, place, and time.  Psychiatric:        Mood and Affect: Mood normal.        Behavior: Behavior normal.     ED Results / Procedures / Treatments   Labs (all labs ordered are listed, but only abnormal results are displayed) Labs Reviewed  CBC - Abnormal; Notable for the following components:      Result Value   RBC 3.85 (*)    HCT 35.6 (*)  All other components within normal limits  URINALYSIS, ROUTINE W REFLEX MICROSCOPIC - Abnormal; Notable for the following components:   Color, Urine COLORLESS (*)    All other components within normal limits  HEPATIC FUNCTION PANEL - Abnormal; Notable for the following components:   AST 12 (*)    All other components within normal limits  BASIC METABOLIC PANEL  D-DIMER, QUANTITATIVE  PREGNANCY, URINE  TROPONIN I (HIGH SENSITIVITY)  TROPONIN I (HIGH SENSITIVITY)    EKG EKG Interpretation Date/Time:  Tuesday July 09 2023 19:44:23 EDT Ventricular Rate:  95 PR Interval:  146 QRS Duration:  98 QT Interval:  330 QTC Calculation: 414 R Axis:   50  Text Interpretation: Normal sinus rhythm Cannot rule out Anterior infarct , age undetermined Abnormal ECG When compared with ECG of 09-Nov-2022 01:35, PREVIOUS ECG IS PRESENT No significant change since last tracing Confirmed by Jacalyn Lefevre [phone]) on 07/09/2023 8:35:00  PM  Radiology DG Chest Port 1 View  Result Date: 07/09/2023 CLINICAL DATA:  Shortness of breath EXAM: PORTABLE CHEST 1 VIEW COMPARISON:  11/09/2022 FINDINGS: The heart size and mediastinal contours are within normal limits. Both lungs are clear. The visualized skeletal structures are unremarkable. IMPRESSION: No active disease. Electronically Signed   By: Duanne Guess D.O.   On: 07/09/2023 20:58    Procedures Procedures    Medications Ordered in ED Medications  sodium chloride 0.9 % bolus 1,000 mL (0 mLs Intravenous Stopped 07/09/23 2144)  ketorolac (TORADOL) 30 MG/ML injection 30 mg (30 mg Intravenous Given 07/09/23 2051)  acetaminophen (TYLENOL) tablet 650 mg (650 mg Oral Given 07/09/23 2219)    ED Course/ Medical Decision Making/ A&P                                 Medical Decision Making Amount and/or Complexity of Data Reviewed Labs: ordered.  Risk OTC drugs. Prescription drug management.   This patient presents to the ED for concern of headache, back pain, this involves an extensive number of treatment options, and is a complaint that carries with it a high risk of complications and morbidity.  The differential diagnosis includes migraine, headache,    Co morbidities that complicate the patient evaluation  PE, anemia   Additional history obtained:  Additional history obtained from epic chart review  Lab Tests:  I Ordered, and personally interpreted labs.  The pertinent results include:  cbc nl, bmp nl, ddimer neg, lfts neg, trop nl, ua neg, preg neg   Imaging Studies ordered:  I ordered imaging studies including cxr  I independently visualized and interpreted imaging which showed No active disease.  I agree with the radiologist interpretation   Cardiac Monitoring:  The patient was maintained on a cardiac monitor.  I personally viewed and interpreted the cardiac monitored which showed an underlying rhythm of: nsr   Medicines ordered and prescription  drug management:  I ordered medication including ivfs/toradol  for sx  Reevaluation of the patient after these medicines showed that the patient improved I have reviewed the patients home medicines and have made adjustments as needed   Test Considered:  Ct, but ddimer neg   Critical Interventions:  Pain control   Problem List / ED Course:  Headache and back pain. Pt has improved.  Labs unremarkable.  Ddimer is neg, so CT not necessary.  Pt is stable for d/c.  Return if worse.  F/u with pcp.   Reevaluation:  After the interventions noted above, I reevaluated the patient and found that they have :improved   Social Determinants of Health:  Lives at home   Dispostion:  After consideration of the diagnostic results and the patients response to treatment, I feel that the patent would benefit from discharge with outpatient f/u.          Final Clinical Impression(s) / ED Diagnoses Final diagnoses:  Acute nonintractable headache, unspecified headache type  Strain of lumbar region, initial encounter    Rx / DC Orders ED Discharge Orders          Ordered    methocarbamol (ROBAXIN) 500 MG tablet  2 times daily PRN        07/09/23 2223              Jacalyn Lefevre, MD 07/09/23 2225

## 2023-07-09 NOTE — ED Notes (Signed)
Report given to the next RN.Marland KitchenMarland Kitchen

## 2023-09-09 ENCOUNTER — Other Ambulatory Visit: Payer: Self-pay

## 2023-09-09 ENCOUNTER — Emergency Department (HOSPITAL_COMMUNITY)
Admission: EM | Admit: 2023-09-09 | Discharge: 2023-09-09 | Disposition: A | Discharge status: Home / Self Care | Payer: 59 | Attending: Emergency Medicine | Admitting: Emergency Medicine

## 2023-09-09 ENCOUNTER — Encounter (HOSPITAL_COMMUNITY): Payer: Self-pay | Admitting: Emergency Medicine

## 2023-09-09 ENCOUNTER — Emergency Department (HOSPITAL_COMMUNITY): Payer: 59

## 2023-09-09 DIAGNOSIS — J988 Other specified respiratory disorders: Secondary | ICD-10-CM | POA: Diagnosis not present

## 2023-09-09 DIAGNOSIS — R0789 Other chest pain: Secondary | ICD-10-CM | POA: Insufficient documentation

## 2023-09-09 DIAGNOSIS — R079 Chest pain, unspecified: Secondary | ICD-10-CM | POA: Diagnosis present

## 2023-09-09 DIAGNOSIS — Z7901 Long term (current) use of anticoagulants: Secondary | ICD-10-CM | POA: Diagnosis not present

## 2023-09-09 DIAGNOSIS — M546 Pain in thoracic spine: Secondary | ICD-10-CM | POA: Diagnosis not present

## 2023-09-09 HISTORY — DX: Other pulmonary embolism without acute cor pulmonale: I26.99

## 2023-09-09 LAB — CBC WITH DIFFERENTIAL/PLATELET
Abs Immature Granulocytes: 0.03 10*3/uL (ref 0.00–0.07)
Basophils Absolute: 0 10*3/uL (ref 0.0–0.1)
Basophils Relative: 1 %
Eosinophils Absolute: 0 10*3/uL (ref 0.0–0.5)
Eosinophils Relative: 0 %
HCT: 40.9 % (ref 36.0–46.0)
Hemoglobin: 13.2 g/dL (ref 12.0–15.0)
Immature Granulocytes: 1 %
Lymphocytes Relative: 13 %
Lymphs Abs: 0.6 10*3/uL — ABNORMAL LOW (ref 0.7–4.0)
MCH: 30.9 pg (ref 26.0–34.0)
MCHC: 32.3 g/dL (ref 30.0–36.0)
MCV: 95.8 fL (ref 80.0–100.0)
Monocytes Absolute: 0.5 10*3/uL (ref 0.1–1.0)
Monocytes Relative: 13 %
Neutro Abs: 3 10*3/uL (ref 1.7–7.7)
Neutrophils Relative %: 72 %
Platelets: 267 10*3/uL (ref 150–400)
RBC: 4.27 MIL/uL (ref 3.87–5.11)
RDW: 12.4 % (ref 11.5–15.5)
WBC: 4.2 10*3/uL (ref 4.0–10.5)
nRBC: 0 % (ref 0.0–0.2)

## 2023-09-09 LAB — BASIC METABOLIC PANEL
Anion gap: 9 (ref 5–15)
BUN: 6 mg/dL (ref 6–20)
CO2: 21 mmol/L — ABNORMAL LOW (ref 22–32)
Calcium: 9.3 mg/dL (ref 8.9–10.3)
Chloride: 104 mmol/L (ref 98–111)
Creatinine, Ser: 0.92 mg/dL (ref 0.44–1.00)
GFR, Estimated: >60 mL/min (ref >60)
Glucose, Bld: 97 mg/dL (ref 70–99)
Potassium: 3.6 mmol/L (ref 3.5–5.1)
Sodium: 134 mmol/L — ABNORMAL LOW (ref 135–145)

## 2023-09-09 LAB — TROPONIN I (HIGH SENSITIVITY): Troponin I (High Sensitivity): <2 ng/L (ref <18)

## 2023-09-09 LAB — HCG, SERUM, QUALITATIVE: Preg, Serum: NEGATIVE

## 2023-09-09 MED ORDER — ACETAMINOPHEN 500 MG PO TABS
1000.0000 mg | ORAL_TABLET | Freq: Once | ORAL | Status: DC
  Filled 2023-09-09: qty 2

## 2023-09-09 MED ORDER — METOCLOPRAMIDE HCL 5 MG/ML IJ SOLN
10.0000 mg | Freq: Once | INTRAMUSCULAR | Status: AC
  Administered 2023-09-09: 10 mg via INTRAVENOUS
  Filled 2023-09-09: qty 2

## 2023-09-09 MED ORDER — AZITHROMYCIN 250 MG PO TABS: 250.0000 mg | ORAL_TABLET | Freq: Every day | ORAL | 0 refills | Status: DC

## 2023-09-09 MED ORDER — DIPHENHYDRAMINE HCL 50 MG/ML IJ SOLN
50.0000 mg | Freq: Once | INTRAMUSCULAR | Status: AC
  Administered 2023-09-09: 50 mg via INTRAVENOUS
  Filled 2023-09-09: qty 1

## 2023-09-09 MED ORDER — IOHEXOL 350 MG/ML SOLN
53.0000 mL | Freq: Once | INTRAVENOUS | Status: AC | PRN
  Administered 2023-09-09: 53 mL via INTRAVENOUS

## 2023-09-09 NOTE — Discharge Instructions (Signed)
Take antibiotic as prescribed and follow-up closely with your primary doctor for recheck. Make sure you take your anticoagulation Eliquis medication until your primary doctor says it is safe to stop or hematology discusses with you. Return for sudden shortness of breath, passing out, significant chest pain or new concerns. Use Tylenol every 4 hours needed for pain.  You are not able to use ibuprofen/Aleve/Motrin medications when you are on a blood thinner.

## 2023-09-09 NOTE — ED Provider Notes (Signed)
[place] EMERGENCY DEPARTMENT AT [facility] Provider Note   CSN: [id number] Arrival date & time: 09/09/23  1308     History  Chief Complaint  Patient presents with   Shortness of Breath    Arrived via EMS for c/o chest pain and shortness of breath. Also reports dizziness. Per EMS, pt has hx of PE. Currently on Eliquis. Reports this morning she started coughing and vomited blood.     Rachel Sanders is a 32 y.o. female.  Patient presents with upper back pain, coughing, anterior nonradiating chest discomfort mild shortness of breath with dizziness since earlier this morning.  No heart problem history.  Patient has history of blood clots no known causes supposed to follow-up with hematologist.  Blood clots diagnosed in January.  Patient missed recently 1 month of anticoagulant now on Eliquis did not take this morning.  Patient denies other medical problems.  The history is provided by the patient.  Shortness of Breath Associated symptoms: chest pain, cough and headaches   Associated symptoms: no abdominal pain, no fever, no neck pain, no rash and no vomiting        Home Medications Prior to Admission medications   Medication Sig Start Date End Date Taking? Authorizing Provider  apixaban (ELIQUIS) 5 MG TABS tablet Take 1 tablet (5 mg total) by mouth 2 (two) times daily. 01/04/23 09/09/23 Yes Loni Muse, MD  azithromycin (ZITHROMAX) 250 MG tablet Take 1 tablet (250 mg total) by mouth daily. Take first 2 tablets together, then 1 every day until finished. 09/09/23  Yes Blane Ohara, MD  cyclobenzaprine (FLEXERIL) 5 MG tablet Take 1 tablet (5 mg total) by mouth 3 (three) times daily as needed for muscle spasms. 11/08/22  Yes Gillermo Murdoch A, DO  guaiFENesin (MUCINEX) 600 MG 12 hr tablet Take 600 mg by mouth 2 (two) times daily as needed for cough or to loosen phlegm.   Yes [provider]  levonorgestrel (MIRENA, 52 MG,) 20 MCG/DAY IUD 1 applicator by  Intrauterine route daily.   Yes [provider]  lidocaine (LIDODERM) 5 % Place 1 patch onto the skin daily. Remove & Discard patch within 12 hours or as directed by MD Patient taking differently: Place 1 patch onto the skin daily as needed (Pain). Remove & Discard patch within 12 hours or as directed by MD 11/08/22  Yes Claudie Leach, DO  valACYclovir (VALTREX) 500 MG tablet Take 500 mg by mouth daily as needed (Flare ups). 08/30/23  Yes [provider]  methocarbamol (ROBAXIN) 500 MG tablet Take 1 tablet (500 mg total) by mouth 2 (two) times daily as needed for muscle spasms. Patient not taking: Reported on 09/09/2023 07/09/23   Jacalyn Lefevre, MD      Allergies    Patient has no known allergies.    Review of Systems   Review of Systems  Constitutional:  Negative for chills and fever.  HENT:  Negative for congestion.   Eyes:  Negative for visual disturbance.  Respiratory:  Positive for cough and shortness of breath.   Cardiovascular:  Positive for chest pain.  Gastrointestinal:  Negative for abdominal pain and vomiting.  Genitourinary:  Negative for dysuria and flank pain.  Musculoskeletal:  Negative for back pain, neck pain and neck stiffness.  Skin:  Negative for rash.  Neurological:  Positive for dizziness and headaches. Negative for light-headedness.    Physical Exam Updated Vital Signs BP 116/68   Pulse 83   Temp 98.1 F (36.7  C) (Oral)   Resp 20   Ht 5\' 5"  (1.651 m)   Wt 79.4 kg   LMP  (LMP Unknown)   SpO2 100%   BMI 29.12 kg/m  Physical Exam Vitals and nursing note reviewed.  Constitutional:      General: She is not in acute distress.    Appearance: She is well-developed.  HENT:     Head: Normocephalic and atraumatic.     Mouth/Throat:     Mouth: Mucous membranes are moist.  Eyes:     General:        Right eye: No discharge.        Left eye: No discharge.     Conjunctiva/sclera: Conjunctivae normal.  Neck:     Trachea: No tracheal  deviation.  Cardiovascular:     Rate and Rhythm: Normal rate and regular rhythm.     Heart sounds: No murmur heard. Pulmonary:     Effort: Pulmonary effort is normal.     Breath sounds: Normal breath sounds.  Abdominal:     General: There is no distension.     Palpations: Abdomen is soft.     Tenderness: There is no abdominal tenderness. There is no guarding.  Musculoskeletal:     Cervical back: Normal range of motion and neck supple. No rigidity.     Right lower leg: No tenderness. No edema.     Left lower leg: No tenderness. No edema.  Skin:    General: Skin is warm.     Capillary Refill: Capillary refill takes less than 2 seconds.     Findings: No rash.  Neurological:     General: No focal deficit present.     Mental Status: She is alert.     Cranial Nerves: No cranial nerve deficit.  Psychiatric:        Mood and Affect: Mood normal.     ED Results / Procedures / Treatments   Labs (all labs ordered are listed, but only abnormal results are displayed) Labs Reviewed  BASIC METABOLIC PANEL - Abnormal; Notable for the following components:      Result Value   Sodium 134 (*)    CO2 21 (*)    All other components within normal limits  CBC WITH DIFFERENTIAL/PLATELET - Abnormal; Notable for the following components:   Lymphs Abs 0.6 (*)    All other components within normal limits  HCG, SERUM, QUALITATIVE  TROPONIN I (HIGH SENSITIVITY)    EKG EKG Interpretation Date/Time:  Monday September 09 2023 07:03:24 EST Ventricular Rate:  78 PR Interval:  126 QRS Duration:  107 QT Interval:  369 QTC Calculation: 421 R Axis:   65  Text Interpretation: Sinus rhythm Confirmed by Blane Ohara [phone]) on 09/09/2023 7:39:55 AM  Radiology CT Angio Chest PE W and/or Wo Contrast  Result Date: 09/09/2023 CLINICAL DATA:  History of pulmonary embolism with missed anticoagulation does EXAM: CT ANGIOGRAPHY CHEST WITH CONTRAST TECHNIQUE: Multidetector CT imaging of the chest was  performed using the standard protocol during bolus administration of intravenous contrast. Multiplanar CT image reconstructions and MIPs were obtained to evaluate the vascular anatomy. RADIATION DOSE REDUCTION: This exam was performed according to the departmental dose-optimization program which includes automated exposure control, adjustment of the mA and/or kV according to patient size and/or use of iterative reconstruction technique. CONTRAST:  53mL OMNIPAQUE IOHEXOL 350 MG/ML SOLN COMPARISON:  CTA chest dated 11/09/2022 FINDINGS: Cardiovascular: The study is high quality for the evaluation of pulmonary embolism. There are no filling  defects in the central, lobar, segmental or subsegmental pulmonary artery branches to suggest acute pulmonary embolism. Great vessels are normal in course and caliber. Normal heart size. No significant pericardial fluid/thickening. Mediastinum/Nodes: Imaged thyroid gland without nodules meeting criteria for imaging follow-up by size. Normal esophagus. No pathologically enlarged axillary, supraclavicular, mediastinal, or hilar lymph nodes. Lungs/Pleura: The central airways are patent. Mild diffuse bronchial wall thickening. Scattered right upper, middle, and peripheral lower lobe ground-glass nodules. Unchanged bilateral lower lobe subpleural and left apical nodules measuring up to 3 mm. No focal consolidation. No pneumothorax. No pleural effusion. Upper abdomen: Normal. Musculoskeletal: No acute or abnormal lytic or blastic osseous lesions. Review of the MIP images confirms the above findings. IMPRESSION: 1. No evidence of pulmonary embolism. 2. New scattered right upper, middle, and peripheral lower lobe ground-glass nodules, likely infectious/inflammatory. 3. Unchanged bilateral lower lobe subpleural and left apical nodules measuring up to 3 mm. No specific follow-up imaging recommended. Electronically Signed   By: Agustin Cree M.D.   On: 09/09/2023 09:58    Procedures Procedures     Medications Ordered in ED Medications  iohexol (OMNIPAQUE) 350 MG/ML injection 53 mL (53 mLs Intravenous Contrast Given 09/09/23 0903)  metoCLOPramide (REGLAN) injection 10 mg (10 mg Intravenous Given 09/09/23 1010)  diphenhydrAMINE (BENADRYL) injection 50 mg (50 mg Intravenous Given 09/09/23 1010)    ED Course/ Medical Decision Making/ A&P                                 Medical Decision Making Amount and/or Complexity of Data Reviewed Labs: ordered. Radiology: ordered.  Risk OTC drugs. Prescription drug management.   Patient with known pulm embolism history and recent noncompliance presents with nonspecific chest pain, upper thoracic pain without injury shortness of breath.  Differential includes recurrent pulmonary embolism, musculoskeletal, atypical ACS, pleuritis, reflux, other.  Plan for CT angiogram pulm embolism for further delineation since patient is moderate risk, general blood work, troponin, EKG, Tylenol for pain and reassessment.  Patient comfortable plan.  Discussed with nursing staff at bedside. Patient's blood work reassuring.  CT angiogram results independently reviewed showing atypical infection but no blood clots.  Instructed patient to continue to follow-up with primary doctor and hematologist for guidance on anticoagulant use.  Discussed taking oral antibiotics as prescribed.  Patient symptoms improved, headache improved with migraine cocktail.  Patient has a ride home.  Patient troponin is negative patient low risk for ACS.         Final Clinical Impression(s) / ED Diagnoses Final diagnoses:  Acute chest pain  Acute bilateral thoracic back pain  Respiratory infection    Rx / DC Orders ED Discharge Orders          Ordered    azithromycin (ZITHROMAX) 250 MG tablet  Daily        09/09/23 1058              Blane Ohara, MD 09/09/23 1059

## 2023-09-09 NOTE — ED Notes (Signed)
Patient transported to CT 

## 2023-09-09 NOTE — ED Notes (Signed)
Pt ambulatory to the nurses' station demanding two warm blankets. EMS personnel informed pt that she could grab the blankets. Pt states, "No, let them do their fucking job. You already had to clean the room. They can bring them to me."

## 2023-09-30 ENCOUNTER — Ambulatory Visit
Admission: RE | Admit: 2023-09-30 | Discharge: 2023-09-30 | Disposition: A | Discharge status: Home / Self Care | Payer: 59 | Source: Ambulatory Visit | Attending: Family Medicine | Admitting: Family Medicine

## 2023-09-30 DIAGNOSIS — R921 Mammographic calcification found on diagnostic imaging of breast: Secondary | ICD-10-CM

## 2023-10-03 ENCOUNTER — Other Ambulatory Visit: Payer: Self-pay | Admitting: Family Medicine

## 2023-10-03 DIAGNOSIS — R921 Mammographic calcification found on diagnostic imaging of breast: Secondary | ICD-10-CM

## 2023-12-19 ENCOUNTER — Encounter (HOSPITAL_BASED_OUTPATIENT_CLINIC_OR_DEPARTMENT_OTHER): Payer: Self-pay

## 2023-12-19 ENCOUNTER — Emergency Department (HOSPITAL_BASED_OUTPATIENT_CLINIC_OR_DEPARTMENT_OTHER)
Admission: EM | Admit: 2023-12-19 | Discharge: 2023-12-19 | Discharge status: Against Medical Advice | Payer: 59 | Attending: Emergency Medicine | Admitting: Emergency Medicine

## 2023-12-19 ENCOUNTER — Other Ambulatory Visit: Payer: Self-pay

## 2023-12-19 DIAGNOSIS — G43909 Migraine, unspecified, not intractable, without status migrainosus: Secondary | ICD-10-CM | POA: Diagnosis present

## 2023-12-19 DIAGNOSIS — Z5321 Procedure and treatment not carried out due to patient leaving prior to being seen by health care provider: Secondary | ICD-10-CM | POA: Diagnosis not present

## 2023-12-19 LAB — CBC WITH DIFFERENTIAL/PLATELET
Abs Immature Granulocytes: 0.03 10*3/uL (ref 0.00–0.07)
Basophils Absolute: 0 10*3/uL (ref 0.0–0.1)
Basophils Relative: 1 %
Eosinophils Absolute: 0 10*3/uL (ref 0.0–0.5)
Eosinophils Relative: 1 %
HCT: 38.2 % (ref 36.0–46.0)
Hemoglobin: 13.2 g/dL (ref 12.0–15.0)
Immature Granulocytes: 0 %
Lymphocytes Relative: 35 %
Lymphs Abs: 2.4 10*3/uL (ref 0.7–4.0)
MCH: 31.9 pg (ref 26.0–34.0)
MCHC: 34.6 g/dL (ref 30.0–36.0)
MCV: 92.3 fL (ref 80.0–100.0)
Monocytes Absolute: 0.4 10*3/uL (ref 0.1–1.0)
Monocytes Relative: 6 %
Neutro Abs: 4 10*3/uL (ref 1.7–7.7)
Neutrophils Relative %: 57 %
Platelets: 292 10*3/uL (ref 150–400)
RBC: 4.14 MIL/uL (ref 3.87–5.11)
RDW: 12.4 % (ref 11.5–15.5)
WBC: 6.9 10*3/uL (ref 4.0–10.5)
nRBC: 0 % (ref 0.0–0.2)

## 2023-12-19 LAB — COMPREHENSIVE METABOLIC PANEL
ALT: 12 U/L (ref 0–44)
AST: 14 U/L — ABNORMAL LOW (ref 15–41)
Albumin: 4.9 g/dL (ref 3.5–5.0)
Alkaline Phosphatase: 51 U/L (ref 38–126)
Anion gap: 10 (ref 5–15)
BUN: 9 mg/dL (ref 6–20)
CO2: 23 mmol/L (ref 22–32)
Calcium: 9.4 mg/dL (ref 8.9–10.3)
Chloride: 104 mmol/L (ref 98–111)
Creatinine, Ser: 0.72 mg/dL (ref 0.44–1.00)
GFR, Estimated: >60 mL/min (ref >60)
Glucose, Bld: 117 mg/dL — ABNORMAL HIGH (ref 70–99)
Potassium: 3.8 mmol/L (ref 3.5–5.1)
Sodium: 137 mmol/L (ref 135–145)
Total Bilirubin: 0.9 mg/dL (ref 0.0–1.2)
Total Protein: 7.9 g/dL (ref 6.5–8.1)

## 2023-12-19 NOTE — ED Triage Notes (Signed)
 Pt c/o "real bad migraine to the point that RX med doesn't help"  Photosensitivity, "throbbing in my head worse when I stand."

## 2023-12-20 ENCOUNTER — Emergency Department (HOSPITAL_BASED_OUTPATIENT_CLINIC_OR_DEPARTMENT_OTHER): Payer: 59

## 2023-12-20 ENCOUNTER — Other Ambulatory Visit: Payer: Self-pay

## 2023-12-20 ENCOUNTER — Emergency Department (HOSPITAL_BASED_OUTPATIENT_CLINIC_OR_DEPARTMENT_OTHER)
Admission: EM | Admit: 2023-12-20 | Discharge: 2023-12-20 | Discharge status: Against Medical Advice | Payer: 59 | Attending: Emergency Medicine | Admitting: Emergency Medicine

## 2023-12-20 ENCOUNTER — Emergency Department (HOSPITAL_BASED_OUTPATIENT_CLINIC_OR_DEPARTMENT_OTHER)
Admission: EM | Admit: 2023-12-20 | Discharge: 2023-12-20 | Disposition: A | Discharge status: Home / Self Care | Payer: 59 | Source: Home / Self Care | Attending: Emergency Medicine | Admitting: Emergency Medicine

## 2023-12-20 ENCOUNTER — Encounter (HOSPITAL_BASED_OUTPATIENT_CLINIC_OR_DEPARTMENT_OTHER): Payer: Self-pay | Admitting: Emergency Medicine

## 2023-12-20 ENCOUNTER — Encounter (HOSPITAL_BASED_OUTPATIENT_CLINIC_OR_DEPARTMENT_OTHER): Payer: Self-pay | Admitting: *Deleted

## 2023-12-20 DIAGNOSIS — Z79899 Other long term (current) drug therapy: Secondary | ICD-10-CM | POA: Insufficient documentation

## 2023-12-20 DIAGNOSIS — R519 Headache, unspecified: Secondary | ICD-10-CM | POA: Insufficient documentation

## 2023-12-20 DIAGNOSIS — G43909 Migraine, unspecified, not intractable, without status migrainosus: Secondary | ICD-10-CM | POA: Insufficient documentation

## 2023-12-20 DIAGNOSIS — R110 Nausea: Secondary | ICD-10-CM | POA: Insufficient documentation

## 2023-12-20 DIAGNOSIS — R112 Nausea with vomiting, unspecified: Secondary | ICD-10-CM | POA: Diagnosis present

## 2023-12-20 DIAGNOSIS — Z7901 Long term (current) use of anticoagulants: Secondary | ICD-10-CM | POA: Insufficient documentation

## 2023-12-20 DIAGNOSIS — Z5321 Procedure and treatment not carried out due to patient leaving prior to being seen by health care provider: Secondary | ICD-10-CM | POA: Insufficient documentation

## 2023-12-20 MED ORDER — DEXAMETHASONE SODIUM PHOSPHATE 10 MG/ML IJ SOLN
10.0000 mg | Freq: Once | INTRAMUSCULAR | Status: AC
  Administered 2023-12-20: 10 mg via INTRAVENOUS
  Filled 2023-12-20: qty 1

## 2023-12-20 MED ORDER — CYCLOBENZAPRINE HCL 5 MG PO TABS: 5.0000 mg | ORAL_TABLET | Freq: Three times a day (TID) | ORAL | 0 refills | Status: AC | PRN

## 2023-12-20 MED ORDER — DIPHENHYDRAMINE HCL 50 MG/ML IJ SOLN
25.0000 mg | Freq: Once | INTRAMUSCULAR | Status: AC
  Administered 2023-12-20: 25 mg via INTRAVENOUS
  Filled 2023-12-20: qty 1

## 2023-12-20 MED ORDER — CYCLOBENZAPRINE HCL 5 MG PO TABS: 5.0000 mg | ORAL_TABLET | Freq: Three times a day (TID) | ORAL | 0 refills | Status: DC | PRN

## 2023-12-20 MED ORDER — PROCHLORPERAZINE EDISYLATE 10 MG/2ML IJ SOLN
10.0000 mg | Freq: Once | INTRAMUSCULAR | Status: AC
  Administered 2023-12-20: 10 mg via INTRAVENOUS
  Filled 2023-12-20: qty 2

## 2023-12-20 MED ORDER — SODIUM CHLORIDE 0.9 % IV BOLUS
1000.0000 mL | Freq: Once | INTRAVENOUS | Status: AC
  Administered 2023-12-20: 1000 mL via INTRAVENOUS

## 2023-12-20 NOTE — ED Notes (Signed)
 Pt called x3 for room placement, no answer.

## 2023-12-20 NOTE — ED Provider Notes (Signed)
 [place] EMERGENCY DEPARTMENT AT [facility] Provider Note   CSN: [id number] Arrival date & time: 12/20/23  1203     History  Chief Complaint  Patient presents with   Headache    Rachel Sanders is a 33 y.o. female.  Patient with history of PE, currently on anticoagulation with apixaban, history of migraine headaches --presents to the emergency department today for evaluation of headache.  Patient has frontal headache with light sensitivity, nausea, no vomiting.  Symptoms started 4 days ago.  Symptoms have been more prolonged than typical symptoms.  She also has some spots in her vision at times.  She reports headache is worsened with movement, lying flat. Patient denies signs of stroke including: facial droop, slurred speech, aphasia, weakness/numbness in extremities, imbalance/trouble walking.  She states that her PCP recommended that she come in to get checked.  She does take cyclobenzaprine at times which will help with her symptoms, which currently is not helping as much.  No fevers.  She denies head injury.  No thunderclap onset.      Home Medications Prior to Admission medications   Medication Sig Start Date End Date Taking? Authorizing Provider  apixaban (ELIQUIS) 5 MG TABS tablet Take 1 tablet (5 mg total) by mouth 2 (two) times daily. 01/04/23 09/09/23  Loni Muse, MD  azithromycin (ZITHROMAX) 250 MG tablet Take 1 tablet (250 mg total) by mouth daily. Take first 2 tablets together, then 1 every day until finished. 09/09/23   Blane Ohara, MD  cyclobenzaprine (FLEXERIL) 5 MG tablet Take 1 tablet (5 mg total) by mouth 3 (three) times daily as needed for muscle spasms. 11/08/22   Claudie Leach, DO  guaiFENesin (MUCINEX) 600 MG 12 hr tablet Take 600 mg by mouth 2 (two) times daily as needed for cough or to loosen phlegm.    [provider]  levonorgestrel (MIRENA, 52 MG,) 20 MCG/DAY IUD 1 applicator by Intrauterine route daily.    [provider]  lidocaine (LIDODERM) 5 % Place 1 patch onto the skin daily. Remove & Discard patch within 12 hours or as directed by MD Patient taking differently: Place 1 patch onto the skin daily as needed (Pain). Remove & Discard patch within 12 hours or as directed by MD 11/08/22   Claudie Leach, DO  methocarbamol (ROBAXIN) 500 MG tablet Take 1 tablet (500 mg total) by mouth 2 (two) times daily as needed for muscle spasms. Patient not taking: Reported on 09/09/2023 07/09/23   Jacalyn Lefevre, MD  valACYclovir (VALTREX) 500 MG tablet Take 500 mg by mouth daily as needed (Flare ups). 08/30/23   [provider]      Allergies    Patient has no known allergies.    Review of Systems   Review of Systems  Physical Exam Updated Vital Signs BP 119/79   Pulse 83   Temp 97.6 F (36.4 C)   Resp 15   SpO2 99%   Physical Exam Vitals and nursing note reviewed.  Constitutional:      Appearance: She is well-developed.  HENT:     Head: Normocephalic and atraumatic.     Right Ear: Tympanic membrane, ear canal and external ear normal.     Left Ear: Tympanic membrane, ear canal and external ear normal.     Nose: Nose normal.     Mouth/Throat:     Pharynx: Uvula midline.  Eyes:     General: Lids are normal.     Extraocular Movements:  Right eye: No nystagmus.     Left eye: No nystagmus.     Conjunctiva/sclera: Conjunctivae normal.     Pupils: Pupils are equal, round, and reactive to light.  Cardiovascular:     Rate and Rhythm: Normal rate and regular rhythm.  Pulmonary:     Effort: Pulmonary effort is normal.     Breath sounds: Normal breath sounds.  Abdominal:     Palpations: Abdomen is soft.     Tenderness: There is no abdominal tenderness.  Musculoskeletal:     Cervical back: Normal range of motion and neck supple. No tenderness or bony tenderness.  Skin:    General: Skin is warm and dry.  Neurological:     Mental Status: She is alert and oriented to person,  place, and time.     GCS: GCS eye subscore is 4. GCS verbal subscore is 5. GCS motor subscore is 6.     Cranial Nerves: No cranial nerve deficit.     Sensory: No sensory deficit.     Motor: No weakness.     Coordination: Coordination normal.     Gait: Gait normal.     Comments: Upper extremity myotomes tested bilaterally:  C5 Shoulder abduction 5/5 C6 Elbow flexion/wrist extension 5/5 C7 Elbow extension 5/5 C8 Finger flexion 5/5 T1 Finger abduction 5/5  Lower extremity myotomes tested bilaterally: L2 Hip flexion 5/5 L3 Knee extension 5/5 L4 Ankle dorsiflexion 5/5 S1 Ankle plantar flexion 5/5      ED Results / Procedures / Treatments   Labs (all labs ordered are listed, but only abnormal results are displayed) Labs Reviewed - No data to display  EKG None  Radiology No results found.  Procedures Procedures    Medications Ordered in ED Medications  prochlorperazine (COMPAZINE) injection 10 mg (10 mg Intravenous Given 12/20/23 1716)  diphenhydrAMINE (BENADRYL) injection 25 mg (25 mg Intravenous Given 12/20/23 1713)  dexamethasone (DECADRON) injection 10 mg (10 mg Intravenous Given 12/20/23 1714)  sodium chloride 0.9 % bolus 1,000 mL (1,000 mLs Intravenous New Bag/Given 12/20/23 1709)    ED Course/ Medical Decision Making/ A&P    Patient seen and examined. History obtained directly from patient.   Labs/EKG: None ordered  Imaging: Ordered CT head without contrast.  Medications/Fluids: Ordered: IV Compazine, IV Benadryl, IV dexamethasone, IV fluid bolus.   Most recent vital signs reviewed and are as follows: BP 119/79   Pulse 83   Temp 97.6 F (36.4 C)   Resp 15   SpO2 99%   Initial impression: Headache with typical features, however patient does report that it has lasted longer than usual.  7:04 PM Reassessment performed. Patient appears stable, improved.  She reports headache not resolved, but improved.  Imaging personally visualized and interpreted  including: CT head, agree negative.  Reviewed pertinent lab work and imaging with patient at bedside. Questions answered.   Most current vital signs reviewed and are as follows: BP 119/79   Pulse 83   Temp 98.5 F (36.9 C) (Oral)   Resp 15   SpO2 99%   Plan: Discharge to home.   Prescriptions written for: Flexeril  Patient counseled on proper use of muscle relaxant medication.  They were told not to drink alcohol, drive any vehicle, or do any dangerous activities while taking this medication.  Patient verbalized understanding.  ED return instructions discussed: Patient counseled to return if they have weakness in their arms or legs, slurred speech, trouble walking or talking, confusion, trouble with their balance, or if  they have any other concerns. Patient verbalizes understanding and agrees with plan.   Follow-up instructions discussed: Patient encouraged to follow-up with their PCP in 5 days.                                 Medical Decision Making Amount and/or Complexity of Data Reviewed Radiology: ordered.  Risk Prescription drug management.   In regards to the patient's headache, critical differentials were considered including subarachnoid hemorrhage, intracerebral hemorrhage, epidural/subdural hematoma, pituitary apoplexy, vertebral/carotid artery dissection, giant cell arteritis, central venous thrombosis, reversible cerebral vasoconstriction, acute angle closure glaucoma, idiopathic intracranial hypertension, bacterial meningitis, viral encephalitis, carbon monoxide poisoning, posterior reversible encephalopathy syndrome, pre-eclampsia.   Reg flag symptoms related to these causes were considered including systemic symptoms (fever, weight loss), neurologic symptoms (confusion, mental status change, vision change, associated seizure), acute or sudden "thunderclap" onset, patient age 70 or older with new or progressive headache, patient of any age with first headache or  change in headache pattern, pregnant or postpartum status, history of HIV or other immunocompromise, history of cancer, headache occurring with exertion, associated neck or shoulder pain, associated traumatic injury, concurrent use of anticoagulation, family history of spontaneous SAH, and concurrent drug use.    Other benign, more common causes of headache were considered including migraine, tension-type headache, cluster headache, referred pain from other cause such as sinus infection, dental pain, trigeminal neuralgia.   On exam, patient has a reassuring neuro exam including baseline mental status, no significant neck pain or meningeal signs, no signs of severe infection or fever.   Patient is anticoagulated, CT head was performed given some atypical features of headache and anticoagulation status.  This was negative.  No thunderclap, symptoms not suspicious for occult bleed or sentinel bleed.  The patient's vital signs, pertinent lab work and imaging were reviewed and interpreted as discussed in the ED course. Hospitalization was considered for further testing, treatments, or serial exams/observation. However as patient is well-appearing, has a stable exam over the course of their evaluation, and reassuring studies today, I do not feel that they warrant admission at this time. This plan was discussed with the patient who verbalizes agreement and comfort with this plan and seems reliable and able to return to the Emergency Department with worsening or changing symptoms.          Final Clinical Impression(s) / ED Diagnoses Final diagnoses:  Acute nonintractable headache, unspecified headache type    Rx / DC Orders ED Discharge Orders          Ordered    cyclobenzaprine (FLEXERIL) 5 MG tablet  3 times daily PRN,   Status:  Discontinued        12/20/23 1857    cyclobenzaprine (FLEXERIL) 5 MG tablet  3 times daily PRN        12/20/23 1857              Renne Crigler,  Cordelia Poche 12/20/23 1906    Rondel Baton, MD 12/23/23 1116

## 2023-12-20 NOTE — ED Triage Notes (Signed)
 Pt reports headache since Monday.  She states that she came yesterday and this am but LWBS due to needing to care for her children.  Pt has had sensitivity to light and sound and nausea with this, declines swab as this feels like a migraine.

## 2023-12-20 NOTE — Discharge Instructions (Signed)
 Please read and follow all provided instructions.  Your diagnoses today include:  1. Acute nonintractable headache, unspecified headache type     Tests performed today include: CT of your head which was normal and did not show any serious cause of your headache Vital signs. See below for your results today.   Medications:  In the Emergency Department you received: Reglan - antinausea/headache medication Benadryl - antihistamine to counteract potential side effects of reglan Decadron - steroid which may help decrease headache recurrence in the short-term   Flexeril (cyclobenzaprine) - muscle relaxer medication  DO NOT drive or perform any activities that require you to be awake and alert because this medicine can make you drowsy.   Take any prescribed medications only as directed.  Additional information:  Follow any educational materials contained in this packet.  You are having a headache. No specific cause was found today for your headache. It may have been a migraine or other cause of headache. Stress, anxiety, fatigue, and depression are common triggers for headaches.   Your headache today does not appear to be life-threatening or require hospitalization, but often the exact cause of headaches is not determined in the emergency department. Therefore, follow-up with your doctor is very important to find out what may have caused your headache and whether or not you need any further diagnostic testing or treatment.   Sometimes headaches can appear benign (not harmful), but then more serious symptoms can develop which should prompt an immediate re-evaluation by your doctor or the emergency department.  BE VERY CAREFUL not to take multiple medicines containing Tylenol (also called acetaminophen). Doing so can lead to an overdose which can damage your liver and cause liver failure and possibly death.   Follow-up instructions: Please follow-up with your primary care provider in the  next 3 days for further evaluation of your symptoms.   Return instructions:  Please return to the Emergency Department if you experience worsening symptoms. Return if the medications do not resolve your headache, if it recurs, or if you have multiple episodes of vomiting or cannot keep down fluids. Return if you have a change from the usual headache. RETURN IMMEDIATELY IF you: Develop a sudden, severe headache Develop confusion or become poorly responsive or faint Develop a fever above 100.65F or problem breathing Have a change in speech, vision, swallowing, or understanding Develop new weakness, numbness, tingling, incoordination in your arms or legs Have a seizure Please return if you have any other emergent concerns.  Additional Information:  Your vital signs today were: BP 119/79   Pulse 83   Temp 98.5 F (36.9 C) (Oral)   Resp 15   SpO2 99%  If your blood pressure (BP) was elevated above 135/85 this visit, please have this repeated by your doctor within one month. --------------

## 2023-12-20 NOTE — ED Triage Notes (Signed)
 Pt reports "migraine" with n/v x 1 week. LWBS yesterday

## 2024-03-28 ENCOUNTER — Ambulatory Visit (HOSPITAL_COMMUNITY)
Admission: RE | Admit: 2024-03-28 | Discharge: 2024-03-28 | Disposition: A | Discharge status: Home / Self Care | Source: Ambulatory Visit | Attending: Family Medicine | Admitting: Family Medicine

## 2024-03-28 ENCOUNTER — Other Ambulatory Visit: Payer: Self-pay | Admitting: Medical Genetics

## 2024-03-28 ENCOUNTER — Encounter (HOSPITAL_COMMUNITY): Payer: Self-pay

## 2024-03-28 VITALS — BP 112/75 | HR 96 | Temp 98.8°F | Resp 16 | Ht 65.0 in | Wt 175.0 lb

## 2024-03-28 DIAGNOSIS — H00033 Abscess of eyelid right eye, unspecified eyelid: Secondary | ICD-10-CM

## 2024-03-28 DIAGNOSIS — H5789 Other specified disorders of eye and adnexa: Secondary | ICD-10-CM | POA: Diagnosis not present

## 2024-03-28 DIAGNOSIS — H1031 Unspecified acute conjunctivitis, right eye: Secondary | ICD-10-CM

## 2024-03-28 MED ORDER — CEPHALEXIN 500 MG PO CAPS: 500.0000 mg | ORAL_CAPSULE | Freq: Two times a day (BID) | ORAL | 0 refills | Status: AC

## 2024-03-28 MED ORDER — GENTAMICIN SULFATE 0.3 % OP SOLN: 2.0000 [drp] | Freq: Three times a day (TID) | OPHTHALMIC | 0 refills | Status: AC

## 2024-03-28 NOTE — Discharge Instructions (Signed)
 Put gentamicin  eyedrops in the affected eye(s) 3 times daily for 5 days.   Take cephalexin  500 mg--1 capsule 2 times daily for 5 days
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
## 2024-03-28 NOTE — ED Triage Notes (Signed)
 Patient presenting with redness, blurriness, and irritation in the right eye onset 4 days ago. Last night Patient states had a bad migraine with it and tenderness around the eye with blinking and touch.  Prescriptions or OTC medications tried: Yes- otc eye drops    with no relief, it actually got worse.

## 2024-03-30 ENCOUNTER — Other Ambulatory Visit (HOSPITAL_COMMUNITY)
Admission: RE | Admit: 2024-03-30 | Discharge: 2024-03-30 | Disposition: A | Discharge status: Home / Self Care | Payer: Self-pay | Source: Ambulatory Visit | Attending: Oncology | Admitting: Oncology

## 2024-04-06 ENCOUNTER — Emergency Department (HOSPITAL_BASED_OUTPATIENT_CLINIC_OR_DEPARTMENT_OTHER)

## 2024-04-06 ENCOUNTER — Encounter (HOSPITAL_BASED_OUTPATIENT_CLINIC_OR_DEPARTMENT_OTHER): Payer: Self-pay

## 2024-04-06 ENCOUNTER — Emergency Department (HOSPITAL_BASED_OUTPATIENT_CLINIC_OR_DEPARTMENT_OTHER)
Admission: EM | Admit: 2024-04-06 | Discharge: 2024-04-06 | Disposition: A | Discharge status: Home / Self Care | Attending: Emergency Medicine | Admitting: Emergency Medicine

## 2024-04-06 ENCOUNTER — Other Ambulatory Visit: Payer: Self-pay

## 2024-04-06 DIAGNOSIS — Z7901 Long term (current) use of anticoagulants: Secondary | ICD-10-CM | POA: Insufficient documentation

## 2024-04-06 DIAGNOSIS — M79602 Pain in left arm: Secondary | ICD-10-CM | POA: Insufficient documentation

## 2024-04-06 DIAGNOSIS — R202 Paresthesia of skin: Secondary | ICD-10-CM | POA: Insufficient documentation

## 2024-04-06 DIAGNOSIS — R233 Spontaneous ecchymoses: Secondary | ICD-10-CM | POA: Diagnosis not present

## 2024-04-06 LAB — CBC WITH DIFFERENTIAL/PLATELET
Abs Immature Granulocytes: 0.05 10*3/uL (ref 0.00–0.07)
Basophils Absolute: 0 10*3/uL (ref 0.0–0.1)
Basophils Relative: 0 %
Eosinophils Absolute: 0 10*3/uL (ref 0.0–0.5)
Eosinophils Relative: 0 %
HCT: 37.8 % (ref 36.0–46.0)
Hemoglobin: 12.9 g/dL (ref 12.0–15.0)
Immature Granulocytes: 1 %
Lymphocytes Relative: 24 %
Lymphs Abs: 2.3 10*3/uL (ref 0.7–4.0)
MCH: 31.9 pg (ref 26.0–34.0)
MCHC: 34.1 g/dL (ref 30.0–36.0)
MCV: 93.3 fL (ref 80.0–100.0)
Monocytes Absolute: 0.4 10*3/uL (ref 0.1–1.0)
Monocytes Relative: 4 %
Neutro Abs: 6.9 10*3/uL (ref 1.7–7.7)
Neutrophils Relative %: 71 %
Platelets: 312 10*3/uL (ref 150–400)
RBC: 4.05 MIL/uL (ref 3.87–5.11)
RDW: 12.1 % (ref 11.5–15.5)
WBC: 9.8 10*3/uL (ref 4.0–10.5)
nRBC: 0 % (ref 0.0–0.2)

## 2024-04-06 LAB — COMPREHENSIVE METABOLIC PANEL WITH GFR
ALT: 10 U/L (ref 0–44)
AST: 21 U/L (ref 15–41)
Albumin: 4.5 g/dL (ref 3.5–5.0)
Alkaline Phosphatase: 72 U/L (ref 38–126)
Anion gap: 14 (ref 5–15)
BUN: 9 mg/dL (ref 6–20)
CO2: 18 mmol/L — ABNORMAL LOW (ref 22–32)
Calcium: 9.5 mg/dL (ref 8.9–10.3)
Chloride: 106 mmol/L (ref 98–111)
Creatinine, Ser: 0.98 mg/dL (ref 0.44–1.00)
GFR, Estimated: >60 mL/min (ref >60)
Glucose, Bld: 156 mg/dL — ABNORMAL HIGH (ref 70–99)
Potassium: 3.6 mmol/L (ref 3.5–5.1)
Sodium: 139 mmol/L (ref 135–145)
Total Bilirubin: 0.6 mg/dL (ref 0.0–1.2)
Total Protein: 8 g/dL (ref 6.5–8.1)

## 2024-04-06 NOTE — Discharge Instructions (Signed)
 Your blood sugar was a little high otherwise your lab work looked normal today.  Your blood cell counts were normal.  The ultrasound of your left upper extremity did not show any blood clots.  Please monitor your symptoms.  If you develop weakness, severe headache or neck pain, you should return to the emergency department.  If your symptoms do not improve in your left upper extremity in the next 1 week, you should follow-up with your primary care doctor.  I would expect that the skin changes in the right upper arm area should gradually fade like a bruise over the next several days.  If these spread or seem to worsen, you should follow-up with your doctor or come back to the emergency department.  No concerning changes with your labs in regards to this today.

## 2024-04-06 NOTE — ED Triage Notes (Signed)
 Pt c/o L arm pain, tingling, numbness in fingers, bruising or something on inside of R forearm near axillary area. Compliant w eliquis  r/t hx PE

## 2024-04-06 NOTE — ED Provider Notes (Signed)
 [place] EMERGENCY DEPARTMENT AT [facility] Provider Note   CSN: [id number] Arrival date & time: 04/06/24  1407     Patient presents with: No chief complaint on file.   Rachel Sanders is a 33 y.o. female.   Patient with history of pulmonary embolism diagnosed January 2024, on Eliquis , reports fair compliance, does miss occasional doses --presents to the emergency department mainly for left arm pain.  Patient noted an aching pain that started in her left hand and radiates up her left arm today.  No swelling of the arm.  She does report some tingling sensation.  Also reports skin changes to the left upper arm, axillary area, looks like small bruises.  Patient denies any injury to the arm, squeezing of the arm.  She does not have pain on the right side.  No swelling on the right side.  No neck pain.  No weakness in the extremities.  No lower extremity symptoms.      Prior to Admission medications   Medication Sig Start Date End Date Taking? Authorizing Provider  apixaban  (ELIQUIS ) 5 MG TABS tablet Take 1 tablet (5 mg total) by mouth 2 (two) times daily. 01/04/23 03/28/24  Inwood Hammans, MD  cyclobenzaprine  (FLEXERIL ) 5 MG tablet Take 1 tablet (5 mg total) by mouth 3 (three) times daily as needed for muscle spasms. 12/20/23   Johntae Broxterman, PA-C  levonorgestrel (MIRENA, 52 MG,) 20 MCG/DAY IUD 1 applicator by Intrauterine route daily.    [provider]  lidocaine  (LIDODERM ) 5 % Place 1 patch onto the skin daily. Remove & Discard patch within 12 hours or as directed by MD Patient taking differently: Place 1 patch onto the skin daily as needed (Pain). Remove & Discard patch within 12 hours or as directed by MD 11/08/22   Aliene Ip, DO    Allergies: Patient has no known allergies.    Review of Systems  Updated Vital Signs BP 120/78 (BP Location: Right Arm)   Pulse 91   Temp 98.2 F (36.8 C)   Resp 16   LMP  (LMP Unknown)   SpO2 100%   Physical  Exam Vitals and nursing note reviewed.  Constitutional:      Appearance: She is well-developed.  HENT:     Head: Normocephalic and atraumatic.     Right Ear: External ear normal.     Left Ear: External ear normal.     Nose: Nose normal.     Mouth/Throat:     Pharynx: Uvula midline.   Eyes:     General: Lids are normal.     Extraocular Movements:     Right eye: No nystagmus.     Left eye: No nystagmus.     Conjunctiva/sclera: Conjunctivae normal.     Pupils: Pupils are equal, round, and reactive to light.    Cardiovascular:     Rate and Rhythm: Normal rate and regular rhythm.     Pulses:          Radial pulses are 2+ on the right side and 2+ on the left side.  Pulmonary:     Effort: Pulmonary effort is normal. No respiratory distress.     Breath sounds: Normal breath sounds.  Abdominal:     Palpations: Abdomen is soft.     Tenderness: There is no abdominal tenderness.   Musculoskeletal:     Right shoulder: No tenderness. Normal range of motion.     Left shoulder: No tenderness. Normal range of motion.  Left upper arm: No tenderness or bony tenderness.     Left forearm: No tenderness or bony tenderness.     Left wrist: No tenderness or bony tenderness.     Left hand: No tenderness. Normal range of motion.     Cervical back: Normal range of motion and neck supple. No tenderness or bony tenderness.     Comments: 2+ radial pulses bilaterally.  Full range of motion of the left and right upper extremity at the wrist, elbow, shoulder, normal strength.  No left shoulder pain or cervical spine tenderness, full range of motion cervical spine.  Right upper arm: On the inside of the arm, near the axilla, patient has several small circular areas that of the appearance of a bruise, nonblanchable, nonindurated.  There do appear to be some lighter areas as well as more pronounced areas.  No tenderness to palpation.  No vesicles.   Skin:    General: Skin is warm and dry.    Neurological:     Mental Status: She is alert and oriented to person, place, and time.     GCS: GCS eye subscore is 4. GCS verbal subscore is 5. GCS motor subscore is 6.     Cranial Nerves: No cranial nerve deficit.     Sensory: No sensory deficit.     Motor: No weakness.     Coordination: Coordination normal.     Gait: Gait normal.     Comments: Patient reports decreased sensation but not absent sensation to light touch of the left hand volarly and dorsally, the left forearm dorsally but not volarly, each side of the upper arm.  No shoulder pain or tenderness.   Upper extremity myotomes tested bilaterally:  C5 Shoulder abduction 5/5 C6 Elbow flexion/wrist extension 5/5 C7 Elbow extension 5/5 C8 Finger flexion 5/5 T1 Finger abduction 5/5  Lower extremity myotomes tested bilaterally: L2 Hip flexion 5/5 L3 Knee extension 5/5 L4 Ankle dorsiflexion 5/5 S1 Ankle plantar flexion 5/5    ED Course  Patient seen and examined. History obtained directly from patient. Work-up including labs, imaging, EKG ordered in triage, if performed, were reviewed.    Labs/EKG: Independently reviewed and interpreted.  This included: CBC normal platelets, normal hemoglobin; CMP, glucose 156, bicarb 18 but normal anion gap.  Imaging: Given history and unexplained pain, left upper extremity DVT study ordered.  Medications/Fluids: None ordered  Most recent vital signs reviewed and are as follows: BP 120/78 (BP Location: Right Arm)   Pulse 91   Temp 98.2 F (36.8 C)   Resp 16   LMP  (LMP Unknown)   SpO2 100%   Initial impression: Ecchymosis, right upper arm, left arm paresthesia  5:45 PM Reassessment performed. Patient appears stable, exam of the left upper extremity is unchanged.  Normal pulses.  Decreased sensation unchanged.  No weakness.  Imaging results reviewed including: Right upper extremity DVT study was negative.  Reviewed pertinent lab work and imaging with patient at bedside.  Questions answered.   Most current vital signs reviewed and are as follows: BP 120/78 (BP Location: Right Arm)   Pulse 91   Temp 98.2 F (36.8 C)   Resp 16   LMP  (LMP Unknown)   SpO2 100%   Plan: Discharge to home.   Prescriptions written for: None  Other home care instructions discussed: Rest left upper extremity, monitor symptoms.  ED return instructions discussed: Return with development of weakness in the left upper extremity, severe headache or neck pain.  We  also discussed return with worsening rash or bruising that spreads away from the current area in the right axillary area.  Follow-up instructions discussed: Patient encouraged to follow-up with their PCP in 7 days if any of her symptoms are persistent.   (all labs ordered are listed, but only abnormal results are displayed) Labs Reviewed  COMPREHENSIVE METABOLIC PANEL WITH GFR - Abnormal; Notable for the following components:      Result Value   CO2 18 (*)    Glucose, Bld 156 (*)    All other components within normal limits  CBC WITH DIFFERENTIAL/PLATELET    EKG: None  Radiology: No results found.   Procedures   Medications Ordered in the ED - No data to display                                  Medical Decision Making Amount and/or Complexity of Data Reviewed Labs: ordered.   With left arm pain and right upper extremity skin changes first noted today.  Upper extremities are neurovascularly intact with exception of subjective decrease sensation without loss of sensation in the left upper extremity.  2+ radial pulses bilaterally.  Do not suspect arterial compromise.  Overall clinically less concerning for DVT of the left upper extremity, but given patient's history of clotting, will rule out DVT.  The skin changes of the right upper extremity appear to be more petechial or bruising in nature.  This area is nontender.  I do not suspect something infectious.  Area is nonblanching.  Overall has a benign  appearance.  Would advise monitoring until resolution.     Final diagnoses:  Paresthesia of arm  Petechiae    ED Discharge Orders     None          Lyna Sandhoff, PA-C 04/06/24 1747    Sallyanne Creamer, DO 04/07/24 [phone]

## 2024-04-08 LAB — GENECONNECT MOLECULAR SCREEN: Genetic Analysis Overall Interpretation: NEGATIVE

## 2024-06-20 ENCOUNTER — Emergency Department (HOSPITAL_COMMUNITY)

## 2024-06-20 ENCOUNTER — Other Ambulatory Visit: Payer: Self-pay

## 2024-06-20 ENCOUNTER — Encounter (HOSPITAL_BASED_OUTPATIENT_CLINIC_OR_DEPARTMENT_OTHER): Payer: Self-pay | Admitting: Emergency Medicine

## 2024-06-20 ENCOUNTER — Emergency Department (HOSPITAL_BASED_OUTPATIENT_CLINIC_OR_DEPARTMENT_OTHER)

## 2024-06-20 ENCOUNTER — Emergency Department (HOSPITAL_BASED_OUTPATIENT_CLINIC_OR_DEPARTMENT_OTHER)
Admission: EM | Admit: 2024-06-20 | Discharge: 2024-06-20 | Disposition: A | Discharge status: Home / Self Care | Attending: Emergency Medicine | Admitting: Emergency Medicine

## 2024-06-20 DIAGNOSIS — R102 Pelvic and perineal pain: Secondary | ICD-10-CM | POA: Insufficient documentation

## 2024-06-20 DIAGNOSIS — Z7901 Long term (current) use of anticoagulants: Secondary | ICD-10-CM | POA: Diagnosis not present

## 2024-06-20 DIAGNOSIS — R935 Abnormal findings on diagnostic imaging of other abdominal regions, including retroperitoneum: Secondary | ICD-10-CM | POA: Diagnosis not present

## 2024-06-20 DIAGNOSIS — R1030 Lower abdominal pain, unspecified: Secondary | ICD-10-CM | POA: Diagnosis present

## 2024-06-20 LAB — BASIC METABOLIC PANEL WITH GFR
Anion gap: 11 (ref 5–15)
BUN: 11 mg/dL (ref 6–20)
CO2: 21 mmol/L — ABNORMAL LOW (ref 22–32)
Calcium: 9.1 mg/dL (ref 8.9–10.3)
Chloride: 105 mmol/L (ref 98–111)
Creatinine, Ser: 0.7 mg/dL (ref 0.44–1.00)
GFR, Estimated: >60 mL/min (ref >60)
Glucose, Bld: 89 mg/dL (ref 70–99)
Potassium: 4 mmol/L (ref 3.5–5.1)
Sodium: 137 mmol/L (ref 135–145)

## 2024-06-20 LAB — CBC WITH DIFFERENTIAL/PLATELET
Abs Immature Granulocytes: 0.04 K/uL (ref 0.00–0.07)
Basophils Absolute: 0.1 K/uL (ref 0.0–0.1)
Basophils Relative: 1 %
Eosinophils Absolute: 0.1 K/uL (ref 0.0–0.5)
Eosinophils Relative: 1 %
HCT: 33.3 % — ABNORMAL LOW (ref 36.0–46.0)
Hemoglobin: 11.1 g/dL — ABNORMAL LOW (ref 12.0–15.0)
Immature Granulocytes: 1 %
Lymphocytes Relative: 31 %
Lymphs Abs: 2.1 K/uL (ref 0.7–4.0)
MCH: 31.6 pg (ref 26.0–34.0)
MCHC: 33.3 g/dL (ref 30.0–36.0)
MCV: 94.9 fL (ref 80.0–100.0)
Monocytes Absolute: 0.5 K/uL (ref 0.1–1.0)
Monocytes Relative: 7 %
Neutro Abs: 4.1 K/uL (ref 1.7–7.7)
Neutrophils Relative %: 59 %
Platelets: 271 K/uL (ref 150–400)
RBC: 3.51 MIL/uL — ABNORMAL LOW (ref 3.87–5.11)
RDW: 12.5 % (ref 11.5–15.5)
WBC: 6.8 K/uL (ref 4.0–10.5)
nRBC: 0 % (ref 0.0–0.2)

## 2024-06-20 LAB — WET PREP, GENITAL
Sperm: NONE SEEN
Trich, Wet Prep: NONE SEEN
WBC, Wet Prep HPF POC: <10 (ref <10)
Yeast Wet Prep HPF POC: NONE SEEN

## 2024-06-20 LAB — URINALYSIS, ROUTINE W REFLEX MICROSCOPIC
Bilirubin Urine: NEGATIVE
Glucose, UA: NEGATIVE mg/dL
Hgb urine dipstick: NEGATIVE
Ketones, ur: NEGATIVE mg/dL
Leukocytes,Ua: NEGATIVE
Nitrite: NEGATIVE
Protein, ur: NEGATIVE mg/dL
Specific Gravity, Urine: 1.01 (ref 1.005–1.030)
pH: 6.5 (ref 5.0–8.0)

## 2024-06-20 LAB — PREGNANCY, URINE: Preg Test, Ur: NEGATIVE

## 2024-06-20 MED ORDER — KETOROLAC TROMETHAMINE 15 MG/ML IJ SOLN
15.0000 mg | Freq: Once | INTRAMUSCULAR | Status: AC
  Administered 2024-06-20: 15 mg via INTRAVENOUS
  Filled 2024-06-20: qty 1

## 2024-06-20 MED ORDER — MORPHINE SULFATE (PF) 4 MG/ML IV SOLN
4.0000 mg | Freq: Once | INTRAVENOUS | Status: AC
  Administered 2024-06-20: 4 mg via INTRAVENOUS
  Filled 2024-06-20: qty 1

## 2024-06-20 MED ORDER — OXYCODONE-ACETAMINOPHEN 5-325 MG PO TABS
1.0000 | ORAL_TABLET | Freq: Once | ORAL | Status: AC
  Administered 2024-06-20: 1 via ORAL
  Filled 2024-06-20: qty 1

## 2024-06-20 MED ORDER — FENTANYL CITRATE PF 50 MCG/ML IJ SOSY
50.0000 ug | PREFILLED_SYRINGE | Freq: Once | INTRAMUSCULAR | Status: AC
  Administered 2024-06-20: 50 ug via INTRAVENOUS
  Filled 2024-06-20: qty 1

## 2024-06-20 MED ORDER — IOHEXOL 300 MG/ML  SOLN
100.0000 mL | Freq: Once | INTRAMUSCULAR | Status: AC | PRN
  Administered 2024-06-20: 100 mL via INTRAVENOUS

## 2024-06-20 MED ORDER — METRONIDAZOLE 500 MG PO TABS: 500.0000 mg | ORAL_TABLET | Freq: Two times a day (BID) | ORAL | 0 refills | Status: AC

## 2024-06-20 NOTE — ED Provider Notes (Signed)
 [place] EMERGENCY DEPARTMENT AT [facility] Provider Note   CSN: [id number] Arrival date & time: 06/20/24  1058     Patient presents with: Abdominal Pain   Rachel Sanders is a 33 y.o. female.   Patient to ED with complaint of pelvic pain that started yesterday and became significant today. She describes pain across the lower abdomen/pelvis that today includes pain at the rectum. No vaginal discharge, bleeding. No urinary symptoms. No difficulty moving her bowels. No blood in her stools. No fever, nausea, vomiting.   The history is provided by the patient. No language interpreter was used.  Abdominal Pain      Prior to Admission medications   Medication Sig Start Date End Date Taking? Authorizing Provider  metroNIDAZOLE  (FLAGYL ) 500 MG tablet Take 1 tablet (500 mg total) by mouth 2 (two) times daily for 7 days. 06/20/24 06/27/24 Yes MessickMaude BROCKS, MD  apixaban  (ELIQUIS ) 5 MG TABS tablet Take 1 tablet (5 mg total) by mouth 2 (two) times daily. 01/04/23 03/28/24  Bernie Guillermina BROCKS, MD  cyclobenzaprine  (FLEXERIL ) 5 MG tablet Take 1 tablet (5 mg total) by mouth 3 (three) times daily as needed for muscle spasms. 12/20/23   Geiple, Joshua, PA-C  levonorgestrel (MIRENA, 52 MG,) 20 MCG/DAY IUD 1 applicator by Intrauterine route daily.    [provider]  lidocaine  (LIDODERM ) 5 % Place 1 patch onto the skin daily. Remove & Discard patch within 12 hours or as directed by MD Patient taking differently: Place 1 patch onto the skin daily as needed (Pain). Remove & Discard patch within 12 hours or as directed by MD 11/08/22   Jamison Rosaline LABOR, DO    Allergies: Patient has no known allergies.    Review of Systems  Gastrointestinal:  Positive for abdominal pain.    Updated Vital Signs BP 109/63 (BP Location: Left Arm)   Pulse 92   Temp 98.5 F (36.9 C) (Oral)   Resp 18   SpO2 94%   Physical Exam Vitals and nursing note reviewed.  Constitutional:      Appearance: She  is well-developed.  HENT:     Head: Normocephalic.  Cardiovascular:     Rate and Rhythm: Normal rate.  Pulmonary:     Effort: Pulmonary effort is normal.  Abdominal:     General: Bowel sounds are normal. There is no distension.     Palpations: Abdomen is soft. There is no mass.     Tenderness: There is abdominal tenderness (Tender in suprapubic and bilateral lower abdominal areas.). There is no guarding or rebound.  Genitourinary:    Comments: Bimanual exam: Cervix palpated to be nodular, mildly tender. There is right and left adnexal tenderness without fullness.  Musculoskeletal:        General: Normal range of motion.     Cervical back: Normal range of motion and neck supple.  Skin:    General: Skin is warm and dry.  Neurological:     General: No focal deficit present.     Mental Status: She is alert and oriented to person, place, and time.     (all labs ordered are listed, but only abnormal results are displayed) Labs Reviewed  WET PREP, GENITAL - Abnormal; Notable for the following components:      Result Value   Clue Cells Wet Prep HPF POC PRESENT (*)    All other components within normal limits  URINALYSIS, ROUTINE W REFLEX MICROSCOPIC - Abnormal; Notable for the following components:   Color,  Urine COLORLESS (*)    All other components within normal limits  CBC WITH DIFFERENTIAL/PLATELET - Abnormal; Notable for the following components:   RBC 3.51 (*)    Hemoglobin 11.1 (*)    HCT 33.3 (*)    All other components within normal limits  BASIC METABOLIC PANEL WITH GFR - Abnormal; Notable for the following components:   CO2 21 (*)    All other components within normal limits  PREGNANCY, URINE  GC/CHLAMYDIA PROBE AMP ([place]) NOT AT [facility]    EKG: None  Radiology: US  PELVIC COMPLETE W TRANSVAGINAL AND TORSION R/O Result Date: 06/20/2024 EXAM: US  Pelvis, Complete Transvaginal and Transabdominal without Doppler TECHNIQUE: Transabdominal and transvaginal pelvic  duplex ultrasound using B-mode/gray scaled imaging without Doppler spectral analysis and color flow was obtained. COMPARISON: 06/20/2024 CT abdomen/pelvis CLINICAL HISTORY: Pelvic pain FINDINGS: UTERUS: Retroverted, retroflexed uterus is normal size. Uterus measures 8.0 x 5.0 x 4.6 cm, uterine volume 96 cc. No uterine fibroids. ENDOMETRIAL STRIPE: Endometrial thickness 5 mm. No endometrial cavity fluid or focal endometrial mass. Low-lying intrauterine device is malpositioned with evidence of 8 mm posterior myometrial penetration. IUD tip is 2.0 cm from the fundal end of the uterine cavity. RIGHT OVARY: Right ovary measures 2.8 x 1.6 x 1.7 cm, right ovarian volume 3.9 cc. Normal right ovary. Normal arterial and venous flow within the right ovary on color and spectral doppler. LEFT OVARY: Left ovary measures 5.4 x 3.6 x 5.1 cm and contains a complex 5.2 x 3.4 x 4.4 cm cyst with fluid-hemorrhage level and no internal vascularity on color Doppler, compatible with left ovarian hemorrhagic cyst. Normal arterial and venous flow within the left ovary on color and spectral doppler. FREE FLUID: Small volume free fluid in the pelvic cul-de-sac and left adnexa. IMPRESSION: 1. Malpositioned low-lying intrauterine device with 8 mm posterior myometrial penetration. Alternative means of contraception advised. 2. Left ovarian 5.2 cm hemorrhagic cyst. Follow-up transvaginal pelvic ultrasound recommended in 6-12 weeks to document resolution. 3. Small volume free fluid in the pelvic cul-de-sac and left adnexa. 4. No evidence of adnexal torsion. Electronically signed by: Selinda Blue MD 06/20/2024 05:47 PM EDT RP Workstation: [id number]   CT ABDOMEN PELVIS W CONTRAST Result Date: 06/20/2024 CLINICAL DATA:  Abdominal pain, acute, nonlocalized. Suprapubic pain and low back pain. EXAM: CT ABDOMEN AND PELVIS WITH CONTRAST TECHNIQUE: Multidetector CT imaging of the abdomen and pelvis was performed using the standard protocol following bolus  administration of intravenous contrast. RADIATION DOSE REDUCTION: This exam was performed according to the departmental dose-optimization program which includes automated exposure control, adjustment of the mA and/or kV according to patient size and/or use of iterative reconstruction technique. CONTRAST:  OMNIPAQUE  IOHEXOL  300 MG/ML  SOLN COMPARISON:  12/27/2019. FINDINGS: Lower chest: No acute abnormality. Hepatobiliary: No focal liver abnormality is seen. No gallstones, gallbladder wall thickening, or biliary dilatation. Pancreas: Unremarkable. No pancreatic ductal dilatation or surrounding inflammatory changes. Spleen: Normal in size without focal abnormality. Adrenals/Urinary Tract: The adrenal glands are within normal limits. The kidneys enhance symmetrically. No renal calculus or hydronephrosis bilaterally. Mild bladder wall thickening is noted. Stomach/Bowel: The stomach is within normal limits. No bowel obstruction, free air, or pneumatosis is seen. Appendix appears normal. Vascular/Lymphatic: No significant vascular findings are present. No enlarged abdominal or pelvic lymph nodes. Reproductive: An IUD is present in the uterus and the arms may be myometrial in location. There is a complex cystic structure in the left adnexa measuring 5.8 cm. No adnexal mass is seen on  the right. Other: A small amount of free fluid is noted in the cul-de-sac. Musculoskeletal: No acute osseous abnormality. IMPRESSION: 1. Complex cystic structure in the left adnexa. Ultrasound is recommended for further evaluation. 2. IUD in the uterus and the arms may be myometrial in location, which may be better evaluated on ultrasound. 3. Mild bladder wall thickening, which may be infectious or inflammatory. Electronically Signed   By: Leita Birmingham M.D.   On: 06/20/2024 14:12     Procedures   Medications Ordered in the ED  fentaNYL  (SUBLIMAZE ) injection 50 mcg (50 mcg Intravenous Given 06/20/24 1206)  iohexol  (OMNIPAQUE ) 300  MG/ML solution 100 mL (100 mLs Intravenous Contrast Given 06/20/24 1305)  oxyCODONE -acetaminophen  (PERCOCET/ROXICET) 5-325 MG per tablet 1 tablet (1 tablet Oral Given 06/20/24 1339)  morphine  (PF) 4 MG/ML injection 4 mg (4 mg Intravenous Given 06/20/24 1608)  ketorolac  (TORADOL ) 15 MG/ML injection 15 mg (15 mg Intravenous Given 06/20/24 1810)    Clinical Course as of 06/20/24 1914  Sat Jun 20, 2024  1432 Patient presented for pelvic pain since yesterday. No discharge, bleeding, urinary symptoms. She reports rectal pain as well. No fever. Labs reassuring. Pregnancy negative. No leukocytosis. No UTI. CT abd/pel ordered as there is no US  in ED at [facility] available. CT results per radiology:  IMPRESSION: 1. Complex cystic structure in the left adnexa. Ultrasound is recommended for further evaluation. 2. IUD in the uterus and the arms may be myometrial in location, which may be better evaluated on ultrasound. 3. Mild bladder wall thickening, which may be infectious or inflammatory.   Discussed CT results with the patient and need for US . She will go to [facility] ED for study. Patient's pain is improved. She is agreeable to transfer for further care.  [SU]    Clinical Course User Index [SU] Odell Balls, PA-C                                 Medical Decision Making Amount and/or Complexity of Data Reviewed Labs: ordered. Radiology: ordered.  Risk Prescription drug management.        Final diagnoses:  Pelvic pain  Abnormal CT scan, pelvis    ED Discharge Orders          Ordered    metroNIDAZOLE  (FLAGYL ) 500 MG tablet  2 times daily        06/20/24 1834               Odell Balls, PA-C 06/20/24 1914    Elnor Jayson LABOR, DO 06/21/24 [phone]

## 2024-06-20 NOTE — ED Notes (Addendum)
 Ambulatory from triage-w/r, steady gait, NAD, calm. US  ordered, pending US . Denies NVD, fever, bleeding, urinary or vaginal sx. Endorses suprapubic and back pain. Rates 6/10.

## 2024-06-20 NOTE — ED Notes (Addendum)
 [facility] in [facility] see[facility]. Blood and urine unremarkable, upreg (-).

## 2024-06-20 NOTE — ED Notes (Signed)
 Pt going to [facility] POV for US . Dr Zackowski  accepting physician.

## 2024-06-20 NOTE — ED Provider Notes (Signed)
 [place] EMERGENCY DEPARTMENT AT [facility] Provider Note   CSN: [id number] Arrival date & time: 06/20/24  1058     Patient presents with: Abdominal Pain   Rachel Sanders is a 33 y.o. female.   33 year old female with prior medical history as detailed below presents for evaluation.  Patient reports suprapubic pain.  Pain began yesterday.  Patient's pain radiates to the left low back.  She denies vaginal discharge or bleeding.  She denies fever.  She appears comfortable on evaluation.  She presented initially to [facility] ED.  Initial labs and CT imaging performed there.  Patient was referred to the [facility] ED for ultrasound.  [facility] did not have ultrasound capability today.  On initial evaluation here in the [facility] ED, patient appears comfortable.  She complains of continued pain.  She denies associated nausea, vomiting.  Pelvic ultrasound pending.  Patient reports that her primary gynecologist is a  Dr. Orie with [facility].  The history is provided by the patient.       Prior to Admission medications   Medication Sig Start Date End Date Taking? Authorizing Provider  apixaban  (ELIQUIS ) 5 MG TABS tablet Take 1 tablet (5 mg total) by mouth 2 (two) times daily. 01/04/23 03/28/24  Bernie Guillermina BROCKS, MD  cyclobenzaprine  (FLEXERIL ) 5 MG tablet Take 1 tablet (5 mg total) by mouth 3 (three) times daily as needed for muscle spasms. 12/20/23   Geiple, Joshua, PA-C  levonorgestrel (MIRENA, 52 MG,) 20 MCG/DAY IUD 1 applicator by Intrauterine route daily.    [provider]  lidocaine  (LIDODERM ) 5 % Place 1 patch onto the skin daily. Remove & Discard patch within 12 hours or as directed by MD Patient taking differently: Place 1 patch onto the skin daily as needed (Pain). Remove & Discard patch within 12 hours or as directed by MD 11/08/22   Jamison Rosaline LABOR, DO    Allergies: Patient has no known allergies.    Review of Systems  All other systems  reviewed and are negative.   Updated Vital Signs BP 114/76 (BP Location: Right Arm)   Pulse 65   Temp 98.9 F (37.2 C) (Oral)   Resp 14   SpO2 97%   Physical Exam Vitals and nursing note reviewed.  Constitutional:      General: She is not in acute distress.    Appearance: Normal appearance. She is well-developed.  HENT:     Head: Normocephalic and atraumatic.  Eyes:     Conjunctiva/sclera: Conjunctivae normal.     Pupils: Pupils are equal, round, and reactive to light.  Cardiovascular:     Rate and Rhythm: Normal rate and regular rhythm.     Heart sounds: Normal heart sounds.  Pulmonary:     Effort: Pulmonary effort is normal. No respiratory distress.     Breath sounds: Normal breath sounds.  Abdominal:     General: There is no distension.     Palpations: Abdomen is soft.     Tenderness: There is abdominal tenderness in the suprapubic area.  Musculoskeletal:        General: No deformity. Normal range of motion.     Cervical back: Normal range of motion and neck supple.  Skin:    General: Skin is warm and dry.  Neurological:     General: No focal deficit present.     Mental Status: She is alert and oriented to person, place, and time.     (all labs ordered are listed, but  only abnormal results are displayed) Labs Reviewed  URINALYSIS, ROUTINE W REFLEX MICROSCOPIC - Abnormal; Notable for the following components:      Result Value   Color, Urine COLORLESS (*)    All other components within normal limits  CBC WITH DIFFERENTIAL/PLATELET - Abnormal; Notable for the following components:   RBC 3.51 (*)    Hemoglobin 11.1 (*)    HCT 33.3 (*)    All other components within normal limits  BASIC METABOLIC PANEL WITH GFR - Abnormal; Notable for the following components:   CO2 21 (*)    All other components within normal limits  PREGNANCY, URINE    EKG: None  Radiology: CT ABDOMEN PELVIS W CONTRAST Result Date: 06/20/2024 CLINICAL DATA:  Abdominal pain, acute,  nonlocalized. Suprapubic pain and low back pain. EXAM: CT ABDOMEN AND PELVIS WITH CONTRAST TECHNIQUE: Multidetector CT imaging of the abdomen and pelvis was performed using the standard protocol following bolus administration of intravenous contrast. RADIATION DOSE REDUCTION: This exam was performed according to the departmental dose-optimization program which includes automated exposure control, adjustment of the mA and/or kV according to patient size and/or use of iterative reconstruction technique. CONTRAST:  OMNIPAQUE  IOHEXOL  300 MG/ML  SOLN COMPARISON:  12/27/2019. FINDINGS: Lower chest: No acute abnormality. Hepatobiliary: No focal liver abnormality is seen. No gallstones, gallbladder wall thickening, or biliary dilatation. Pancreas: Unremarkable. No pancreatic ductal dilatation or surrounding inflammatory changes. Spleen: Normal in size without focal abnormality. Adrenals/Urinary Tract: The adrenal glands are within normal limits. The kidneys enhance symmetrically. No renal calculus or hydronephrosis bilaterally. Mild bladder wall thickening is noted. Stomach/Bowel: The stomach is within normal limits. No bowel obstruction, free air, or pneumatosis is seen. Appendix appears normal. Vascular/Lymphatic: No significant vascular findings are present. No enlarged abdominal or pelvic lymph nodes. Reproductive: An IUD is present in the uterus and the arms may be myometrial in location. There is a complex cystic structure in the left adnexa measuring 5.8 cm. No adnexal mass is seen on the right. Other: A small amount of free fluid is noted in the cul-de-sac. Musculoskeletal: No acute osseous abnormality. IMPRESSION: 1. Complex cystic structure in the left adnexa. Ultrasound is recommended for further evaluation. 2. IUD in the uterus and the arms may be myometrial in location, which may be better evaluated on ultrasound. 3. Mild bladder wall thickening, which may be infectious or inflammatory. Electronically  Signed   By: Leita Birmingham M.D.   On: 06/20/2024 14:12     Procedures   Medications Ordered in the ED  morphine  (PF) 4 MG/ML injection 4 mg (has no administration in time range)  fentaNYL  (SUBLIMAZE ) injection 50 mcg (50 mcg Intravenous Given 06/20/24 1206)  iohexol  (OMNIPAQUE ) 300 MG/ML solution 100 mL (100 mLs Intravenous Contrast Given 06/20/24 1305)  oxyCODONE -acetaminophen  (PERCOCET/ROXICET) 5-325 MG per tablet 1 tablet (1 tablet Oral Given 06/20/24 1339)                                    Medical Decision Making Patient with approximate 24 hours of left lower quadrant abdominal pain.  Workup demonstrates left ovarian cyst with likely hemorrhagic component.  After treatment patient feels much improved.  Patient with clue cells present on wet prep.  Will treat with Flagyl .  Case discussed with Dr. Rosalva, GYN covering for [facility].  Agrees with plan for close outpatient follow-up.  Patient comfortable at time of discharge.  She  understands need for close outpatient follow-up.  Strict return precautions given and understood.  Amount and/or Complexity of Data Reviewed Labs: ordered. Radiology: ordered.  Risk Prescription drug management.        Final diagnoses:  Pelvic pain  Abnormal CT scan, pelvis    ED Discharge Orders          Ordered    metroNIDAZOLE  (FLAGYL ) 500 MG tablet  2 times daily        06/20/24 1834               Laurice Maude BROCKS, MD 06/20/24 1836

## 2024-06-20 NOTE — ED Notes (Signed)
 ED Provider at bedside.

## 2024-06-20 NOTE — ED Notes (Signed)
 Taken to [place] via Doctor, general practice

## 2024-06-20 NOTE — Discharge Instructions (Addendum)
 Return for any problem.    Follow-up with [facility] as instructed.  Take all of the Flagyl  as prescribed to treat possible early BV.  Use Tylenol  and ibuprofen  for pain.  If you develop increased pain, fever, nausea, vomiting return to the ED for evaluation.

## 2024-06-20 NOTE — ED Notes (Signed)
 Spoke with lab ato add on urine preg

## 2024-06-20 NOTE — ED Notes (Signed)
 Pt returned from CT

## 2024-06-20 NOTE — ED Triage Notes (Signed)
 Suprapubic pain Started yesterday Sharp pains in my butt Denies n/v/d Lower back pain Concerned that something could be wrong with IUD

## 2024-06-23 LAB — GC/CHLAMYDIA PROBE AMP (~~LOC~~) NOT AT ARMC
Chlamydia: NEGATIVE
Comment: NEGATIVE
Comment: NORMAL
Neisseria Gonorrhea: NEGATIVE

## 2024-07-14 NOTE — Progress Notes (Signed)
 HEMATOLOGY PROGRESS NOTE  PATIENT NAME: Rachel Sanders MRN#: [id number] DOB: 05/06/1991  Age: 33 y.o.   DATE: 07/14/2024  HEMATOLOGICAL DIAGNOSIS: 11/09/2022 right upper lobe pulmonary embolism     HEMATOLOGICAL TREATMENT HISTORY: 11/09/2022 OSH CTA: Right upper lobe pulmonary embolism 12/17/2022 OSH bilateral lower extremity duplexes = no DVTs 09/09/2023 OSH CTA chest: No PE 10/2022 Systemic anticoagulation: Xarelto  (intolerant with arthralgias) Switched to Eliquis  Self discontinued Eliquis  between January 2025 and March 2025 Resumed the Eliquis  to 01/07/2024   SUBJECTIVE: Rachel Sanders is a 33 y.o. female who presents for follow up.   She reports doing well in general.  She reports that she had an upper respiratory infection.  She thinks that it was the flu.  During this time she was experiencing chest pain.  She denies any associated shortness of breath.  She denies any nausea, vomiting, diarrhea or constipation.  She has had vaginal bleeding.  She was seen in the emergency room and she was told her IUD had migrated and that she had an ovarian cyst that was contributing to the bleeding.  She is planning to follow-up with gynecology about this concern.  She denies any other type of bleeding.  She is taking Eliquis  every day as recommended.  ALLERGIES:  Allergies as of 07/14/2024  . (No Known Allergies)    MEDICATIONS:   Current Medications[1]  REVIEW OF SYSTEMS: As per the HPI above   PAST MEDICAL HISTORY:  Past Medical History:  Diagnosis Date  . Migraines   . Pulmonary embolism  (*)     SURGICAL HISTORY: Past Surgical History:  Procedure Laterality Date  . Umbilical hernia repair    . Wisdom tooth extraction       FAMILY HISTORY:   Family History  Problem Relation Age of Onset  . No Known Problems Mother   . No Known Problems Father   . Diabetes Sister   . No Known Problems Sister   . No Known Problems Brother   . No Known Problems Son   . No Known  Problems Son   . No Known Problems Son   . Clotting disorder Neg Hx   . Cancer Neg Hx      SOCIAL HISTORY:  Social History   Tobacco Use  . Smoking status: Never  . Smokeless tobacco: Never  Substance Use Topics  . Alcohol use: Never    PHYSICAL EXAM:    BP 117/80 (BP Location: Left Upper Arm, Patient Position: Sitting)   Pulse 94   Temp 97.8 F (36.6 C) (Temporal)   Resp 16   Ht 5' 5 (1.651 m)   Wt 175 lb (79.4 kg)   SpO2 98%   BMI 29.12 kg/m    GENERAL: well nourished, well hydrated in no acute distress.  HEENT: Moist mucous membrane NECK: supple, no masses, trachea midline, no thyromegaly.  RESPIRATORY:  no intercostal retractions or use of accessory muscles. Lungs clear to auscultation bilaterally, no rales, rhonchi, or wheezes.  CARDIOVASCULAR: Regular rate and rhythm,normal S1 and S2, no murmur GASTROINTESTINAL: Abdomen soft, non-tender, non distended, active bowel sounds LYMPHATIC: no cervical or supraclavicular adenopathy.  MUSCULOSKELETAL: No LE edema, no deformities NEURO: no focal deficit, moves all four extremities, alert and oriented PSYCH: appropriate mood and affect DERM: skin warm and dry, no rash on exposed skin    LABS:  Recent Results (from the past 72 hours)  CBC And Differential   Collection Time: 07/14/24 11:13 AM  Result Value Ref Range   WBC  7.5 3.7 - 11.0 thou/mcL   RBC 3.71 (L) 4.01 - 4.90 million/mcL   HGB 11.8 (L) 12.2 - 14.9 gm/dL   HCT 64.5 (L) 64.1 - 52.0 %   MCV 95.4 82.0 - 98.0 fL   MCH 31.8 27.0 - 33.0 pg   MCHC 33.3 31.0 - 37.0 gm/dL   Plt Ct 731 849 - 599 thou/mcL   RDW SD 41.5 36.0 - 47.0 fL   RDW CV 11.7 (L) 11.8 - 14.9 %   NEUTROPHIL % 58.0 %   LYMPHOCYTE % 34.9 %   MONOCYTE % 5.6 %   Eosinophil % 0.5 %   BASOPHIL % 0.7 %   ABSOLUTE NEUTROPHIL COUNT 4.33 1.50 - 7.50 thou/mcL   ABSOLUTE LYMPHOCYTE COUNT 2.61 1.00 - 4.50 thou/mcL   Absolute Monocyte Count 0.42 0.10 - 0.80 thou/mcL   Absolute Eosinophil Count 0.04  0.00 - 0.50 thou/mcL   Absolute Basophil Count 0.05 0.00 - 0.20 thou/mcL     IMPRESSION / PLAN:  Rachel Sanders is a 33 y.o. female who presents for follow up.   Unprovoked right upper lobe pulmonary emboli:  I had a long discussion with  Dena Buddle.  I reviewed how she presented, was diagnosed, and treated for her PE.  I went over the difference between  provoked and unprovoked clots.  We discussed the fact that in her case these were unprovoked clots.    We discussed the fact that we typically treat unprovoked clots with long term anticoagulation. We discussed the fact that the risk of VTE recurrence is higher with unprovoked clots ~ 20-25% over 5 yrs.    She initially had partial hypercoagulable at [facility] that included a negative PNH, beta-2  glycoprotein, anticardiolipin antibody, factor V Leiden, prothrombin gene mutation.  I completed her evaluation and was negative protein S, protein C and Antithrombin III deficiency.   We have discussed the risk of bleeding associated with systemic anticoagulation. I reviewed the fact that any trauma including car accidents and head injury, melena, hematochezia, or hematuria would be reasons to call me or go to the emergency room.   We discussed the fact that as long as the risk of bleeding is low, she will benefit from lifelong systemic anticoagulation.  She is agreeable to resume Eliquis .   - She will continue long-term anticoagulation with Eliquis . - We will monitor   Long-term current use of anticoagulant: She is on long-term Eliquis  I reviewed today's CBC with the patient.  It shows mild anemia with a hemoglobin of 11.8.   A CMP has been ordered and is pending We discussed the fact that if she requires surgery she will need to hold Eliquis  for 48 hours before surgery and resume it 24 hours after surgery We will monitor  Overweight: Today's BMI noted to be 29.12 We will monitor  FOLLOW UP: Labs + visit in 6 mos  Disclaimer:  This note was  dictated with voice recognition software. Similar sounding words can inadvertently be transcribed and may not be corrected upon review   Orders Placed This Encounter  Procedures  . CBC And Differential  . Comprehensive Metabolic Panel    Romana RAYMOND Quay, MD 07/14/2024 / 4:57 PM        [1] Current Outpatient Medications  Medication Sig Dispense Refill  . apixaban  (ELIQUIS ) 2.5 mg tablet Take one tablet (2.5 mg dose) by mouth 2 (two) times daily. 60 tablet 5   No current facility-administered medications for this visit.

## 2024-08-12 ENCOUNTER — Other Ambulatory Visit: Payer: Self-pay

## 2024-08-12 ENCOUNTER — Encounter (HOSPITAL_BASED_OUTPATIENT_CLINIC_OR_DEPARTMENT_OTHER): Payer: Self-pay

## 2024-08-12 ENCOUNTER — Emergency Department (HOSPITAL_BASED_OUTPATIENT_CLINIC_OR_DEPARTMENT_OTHER)

## 2024-08-12 ENCOUNTER — Emergency Department (HOSPITAL_BASED_OUTPATIENT_CLINIC_OR_DEPARTMENT_OTHER)
Admission: EM | Admit: 2024-08-12 | Discharge: 2024-08-12 | Disposition: A | Discharge status: Home / Self Care | Attending: Emergency Medicine | Admitting: Emergency Medicine

## 2024-08-12 DIAGNOSIS — T8332XA Displacement of intrauterine contraceptive device, initial encounter: Secondary | ICD-10-CM | POA: Insufficient documentation

## 2024-08-12 DIAGNOSIS — Y762 Prosthetic and other implants, materials and accessory obstetric and gynecological devices associated with adverse incidents: Secondary | ICD-10-CM | POA: Diagnosis not present

## 2024-08-12 DIAGNOSIS — D72829 Elevated white blood cell count, unspecified: Secondary | ICD-10-CM | POA: Insufficient documentation

## 2024-08-12 DIAGNOSIS — Z7901 Long term (current) use of anticoagulants: Secondary | ICD-10-CM | POA: Diagnosis not present

## 2024-08-12 DIAGNOSIS — T839XXA Unspecified complication of genitourinary prosthetic device, implant and graft, initial encounter: Secondary | ICD-10-CM

## 2024-08-12 LAB — URINALYSIS, ROUTINE W REFLEX MICROSCOPIC
Bilirubin Urine: NEGATIVE
Glucose, UA: NEGATIVE mg/dL
Hgb urine dipstick: NEGATIVE
Leukocytes,Ua: NEGATIVE
Nitrite: NEGATIVE
Specific Gravity, Urine: 1.03 (ref 1.005–1.030)
pH: 6.5 (ref 5.0–8.0)

## 2024-08-12 LAB — CBC
HCT: 36.4 % (ref 36.0–46.0)
Hemoglobin: 12.2 g/dL (ref 12.0–15.0)
MCH: 31.4 pg (ref 26.0–34.0)
MCHC: 33.5 g/dL (ref 30.0–36.0)
MCV: 93.6 fL (ref 80.0–100.0)
Platelets: 320 K/uL (ref 150–400)
RBC: 3.89 MIL/uL (ref 3.87–5.11)
RDW: 12.5 % (ref 11.5–15.5)
WBC: 11.2 K/uL — ABNORMAL HIGH (ref 4.0–10.5)
nRBC: 0 % (ref 0.0–0.2)

## 2024-08-12 LAB — COMPREHENSIVE METABOLIC PANEL WITH GFR
ALT: 10 U/L (ref 0–44)
AST: 16 U/L (ref 15–41)
Albumin: 4.3 g/dL (ref 3.5–5.0)
Alkaline Phosphatase: 72 U/L (ref 38–126)
Anion gap: 12 (ref 5–15)
BUN: 12 mg/dL (ref 6–20)
CO2: 21 mmol/L — ABNORMAL LOW (ref 22–32)
Calcium: 9.3 mg/dL (ref 8.9–10.3)
Chloride: 104 mmol/L (ref 98–111)
Creatinine, Ser: 0.74 mg/dL (ref 0.44–1.00)
GFR, Estimated: >60 mL/min (ref >60)
Glucose, Bld: 102 mg/dL — ABNORMAL HIGH (ref 70–99)
Potassium: 3.8 mmol/L (ref 3.5–5.1)
Sodium: 137 mmol/L (ref 135–145)
Total Bilirubin: 0.4 mg/dL (ref 0.0–1.2)
Total Protein: 7.3 g/dL (ref 6.5–8.1)

## 2024-08-12 LAB — PREGNANCY, URINE: Preg Test, Ur: NEGATIVE

## 2024-08-12 LAB — LIPASE, BLOOD: Lipase: 28 U/L (ref 11–51)

## 2024-08-12 MED ORDER — IBUPROFEN 600 MG PO TABS
600.0000 mg | ORAL_TABLET | Freq: Four times a day (QID) | ORAL | 0 refills | Status: AC | PRN
Start: 2024-08-12 — End: ?

## 2024-08-12 MED ORDER — OXYCODONE-ACETAMINOPHEN 5-325 MG PO TABS: 2.0000 | ORAL_TABLET | Freq: Four times a day (QID) | ORAL | 0 refills | Status: AC | PRN

## 2024-08-12 MED ORDER — FENTANYL CITRATE (PF) 50 MCG/ML IJ SOSY
50.0000 ug | PREFILLED_SYRINGE | Freq: Once | INTRAMUSCULAR | Status: AC
  Administered 2024-08-12: 50 ug via INTRAVENOUS
  Filled 2024-08-12: qty 1

## 2024-08-12 MED ORDER — IOHEXOL 300 MG/ML  SOLN
100.0000 mL | Freq: Once | INTRAMUSCULAR | Status: AC | PRN
  Administered 2024-08-12: 100 mL via INTRAVENOUS

## 2024-08-12 MED ORDER — KETOROLAC TROMETHAMINE 15 MG/ML IJ SOLN
15.0000 mg | Freq: Once | INTRAMUSCULAR | Status: AC
Start: 2024-08-12 — End: 2024-08-12
  Administered 2024-08-12: 15 mg via INTRAMUSCULAR

## 2024-08-12 MED ORDER — KETOROLAC TROMETHAMINE 15 MG/ML IJ SOLN
15.0000 mg | Freq: Once | INTRAMUSCULAR | Status: DC
  Filled 2024-08-12: qty 1

## 2024-08-12 NOTE — Discharge Instructions (Signed)
 As noted, you will be contacted by OB/GYN here at [facility].  If pain significantly worsens, or you have any new onset of bright red vaginal bleeding, please proceed to the emergency room however as we discussed please go to [facility] emergency room as the [facility] is there and they would have on call OB/GYN to provide immediate assistance there.

## 2024-08-12 NOTE — ED Triage Notes (Signed)
 Pt reports being dx with ovarian cysts but has not been able to get into OB. Pt reports abd pain and headache.

## 2024-08-12 NOTE — ED Provider Notes (Signed)
 [place] EMERGENCY DEPARTMENT AT [facility] Provider Note   CSN: [id number] Arrival date & time: 08/12/24  1714     Patient presents with: Abdominal Pain and Headache   Rachel Sanders is a 33 y.o. female whose primary complaint is previous known ovarian cysts as well as as displaced IUD.  States that she has not followed up with gynecology secondary to insurance concerns and lack of appointment availability.  States the pain is primarily in the lower abdomen, exquisite tenderness appreciated to the left lower abdomen.  She also states that she had some dark red to brown vaginal discharge that preceded the abdominal pain, that this has been getting progressively worse over the last several weeks with first noted incidence at the beginning of the month.  Secondary to Mirena IUD placement she has not had menstruation since placement.  She also has an associated complaint of headache, states that is primarily across the breadth of her forehead, denies having any photophobia, denies any dizziness or nausea/vomiting.    Abdominal Pain Associated symptoms: vaginal bleeding   Headache Associated symptoms: abdominal pain        Prior to Admission medications   Medication Sig Start Date End Date Taking? Authorizing Provider  ibuprofen  (ADVIL ) 600 MG tablet Take 1 tablet (600 mg total) by mouth every 6 (six) hours as needed. 08/12/24  Yes Myriam Dorn BROCKS, PA  oxyCODONE -acetaminophen  (PERCOCET/ROXICET) 5-325 MG tablet Take 2 tablets by mouth every 6 (six) hours as needed for up to 5 days for severe pain (pain score 7-10). 08/12/24 08/17/24 Yes Myriam Dorn BROCKS, PA  apixaban  (ELIQUIS ) 5 MG TABS tablet Take 1 tablet (5 mg total) by mouth 2 (two) times daily. 01/04/23 03/28/24  Bernie Guillermina BROCKS, MD  cyclobenzaprine  (FLEXERIL ) 5 MG tablet Take 1 tablet (5 mg total) by mouth 3 (three) times daily as needed for muscle spasms. 12/20/23   Geiple, Joshua, PA-C  levonorgestrel (MIRENA, 52  MG,) 20 MCG/DAY IUD 1 applicator by Intrauterine route daily.    [provider]  lidocaine  (LIDODERM ) 5 % Place 1 patch onto the skin daily. Remove & Discard patch within 12 hours or as directed by MD Patient taking differently: Place 1 patch onto the skin daily as needed (Pain). Remove & Discard patch within 12 hours or as directed by MD 11/08/22   Jamison Rosaline LABOR, DO    Allergies: Patient has no known allergies.    Review of Systems  Gastrointestinal:  Positive for abdominal pain.  Genitourinary:  Positive for pelvic pain and vaginal bleeding.  Neurological:  Positive for headaches.  All other systems reviewed and are negative.   Updated Vital Signs BP (!) 151/87   Pulse 89   Temp 99.2 F (37.3 C)   Resp 18   Ht 5' 5 (1.651 m)   Wt 78.5 kg   SpO2 98%   BMI 28.79 kg/m   Physical Exam Vitals and nursing note reviewed.  Constitutional:      General: She is awake. She is not in acute distress.    Appearance: Normal appearance. She is well-developed and normal weight.  HENT:     Head: Normocephalic and atraumatic.     Right Ear: Hearing, tympanic membrane, ear canal and external ear normal.     Left Ear: Hearing, tympanic membrane, ear canal and external ear normal.     Nose: Nose normal.     Mouth/Throat:     Lips: Pink.     Mouth: Mucous membranes are moist.  Pharynx: Oropharynx is clear. Uvula midline.  Eyes:     Extraocular Movements: Extraocular movements intact.     Conjunctiva/sclera: Conjunctivae normal.     Pupils: Pupils are equal, round, and reactive to light.  Cardiovascular:     Rate and Rhythm: Normal rate and regular rhythm.     Pulses: Normal pulses.     Heart sounds: Normal heart sounds, S1 normal and S2 normal. No murmur heard.    No friction rub. No gallop.  Pulmonary:     Effort: Pulmonary effort is normal.     Breath sounds: Normal breath sounds.  Abdominal:     General: Abdomen is flat. Bowel sounds are normal.     Palpations:  Abdomen is soft.     Tenderness: There is abdominal tenderness in the right lower quadrant, suprapubic area and left lower quadrant.  Musculoskeletal:        General: Normal range of motion.     Cervical back: Full passive range of motion without pain, normal range of motion and neck supple.     Right lower leg: No edema.     Left lower leg: No edema.  Lymphadenopathy:     Cervical: No cervical adenopathy.  Skin:    General: Skin is warm and dry.     Capillary Refill: Capillary refill takes less than 2 seconds.  Neurological:     General: No focal deficit present.     Mental Status: She is alert. Mental status is at baseline.  Psychiatric:        Mood and Affect: Mood normal.        Behavior: Behavior is cooperative.     (all labs ordered are listed, but only abnormal results are displayed) Labs Reviewed  COMPREHENSIVE METABOLIC PANEL WITH GFR - Abnormal; Notable for the following components:      Result Value   CO2 21 (*)    Glucose, Bld 102 (*)    All other components within normal limits  CBC - Abnormal; Notable for the following components:   WBC 11.2 (*)    All other components within normal limits  URINALYSIS, ROUTINE W REFLEX MICROSCOPIC - Abnormal; Notable for the following components:   Ketones, ur TRACE (*)    Protein, ur TRACE (*)    All other components within normal limits  LIPASE, BLOOD  PREGNANCY, URINE    EKG: None  Radiology: CT ABDOMEN PELVIS W CONTRAST Result Date: 08/12/2024 CLINICAL DATA:  Pelvic pain EXAM: CT ABDOMEN AND PELVIS WITH CONTRAST TECHNIQUE: Multidetector CT imaging of the abdomen and pelvis was performed using the standard protocol following bolus administration of intravenous contrast. RADIATION DOSE REDUCTION: This exam was performed according to the departmental dose-optimization program which includes automated exposure control, adjustment of the mA and/or kV according to patient size and/or use of iterative reconstruction technique.  CONTRAST:  OMNIPAQUE  IOHEXOL  300 MG/ML  SOLN COMPARISON:  Ultrasound from earlier in the same day. FINDINGS: Lower chest: No acute abnormality. Hepatobiliary: No focal liver abnormality is seen. No gallstones, gallbladder wall thickening, or biliary dilatation. Pancreas: Unremarkable. No pancreatic ductal dilatation or surrounding inflammatory changes. Spleen: Normal in size without focal abnormality. Adrenals/Urinary Tract: Adrenal glands are within normal limits. Kidneys demonstrate a normal enhancement pattern bilaterally. No renal calculi or obstructive changes are seen. The bladder is decompressed. Stomach/Bowel: No obstructive or inflammatory changes of the colon are seen. The appendix is within normal limits. Small bowel and stomach are unremarkable. Vascular/Lymphatic: No significant vascular findings are present.  No enlarged abdominal or pelvic lymph nodes. Reproductive: Uterus is retroverted. IUD is again noted in place somewhat lower than that expected with a 90 degree rotation with respect to the endometrial canal. The left adnexal cyst is less well visualized on this exam. Other: No abdominal wall hernia or abnormality. No abdominopelvic ascites. Musculoskeletal: No acute or significant osseous findings. IMPRESSION: IUD is rotated within the uterus as described. No acute abnormality is noted. Electronically Signed   By: Oneil Devonshire M.D.   On: 08/12/2024 20:25   US  PELVIC COMPLETE W TRANSVAGINAL AND TORSION R/O Result Date: 08/12/2024 CLINICAL DATA:  Pelvic pain. EXAM: TRANSABDOMINAL AND TRANSVAGINAL ULTRASOUND OF PELVIS DOPPLER ULTRASOUND OF OVARIES TECHNIQUE: Both transabdominal and transvaginal ultrasound examinations of the pelvis were performed. Transabdominal technique was performed for global imaging of the pelvis including uterus, ovaries, adnexal regions, and pelvic cul-de-sac. It was necessary to proceed with endovaginal exam following the transabdominal exam to visualize the  endometrium and ovaries. Color and duplex Doppler ultrasound was utilized to evaluate blood flow to the ovaries. COMPARISON:  Ultrasound dated 06/20/2024. FINDINGS: Uterus Measurements: 8.4 x 4.6279 cm = volume: 119 mL. The uterus is retroverted. Endometrium Thickness: 2 mm. An intrauterine device is noted which is inferiorly displaced and located in the lower endometrium extending to the internal os. There is penetration of the arms of the IUD into the myometrium. Right ovary Measurements: 2.9 x 2.3 x 2.0 cm = volume: 6.8 mL. Normal appearance/no adnexal mass. Left ovary Measurements: 3.3 x 2.4 x 2.7 cm = volume: 11.5 mL. Significant interval decrease in the size of the left ovarian hemorrhagic cyst, now measuring 1.9 x 1.5 x 1.6 cm. Pulsed Doppler evaluation of both ovaries demonstrates normal low-resistance arterial and venous waveforms. Other findings No abnormal free fluid. IMPRESSION: 1. Inferiorly displaced IUD with myometrial penetration. 2. Interval decrease in the size of the left ovarian hemorrhagic cyst. Continued follow-up after 2 menstrual cycles recommended. Electronically Signed   By: Vanetta Chou M.D.   On: 08/12/2024 19:52     Procedures   Medications Ordered in the ED  ketorolac  (TORADOL ) 15 MG/ML injection 15 mg (15 mg Intramuscular Given 08/12/24 1858)  iohexol  (OMNIPAQUE ) 300 MG/ML solution 100 mL (100 mLs Intravenous Contrast Given 08/12/24 2012)  fentaNYL  (SUBLIMAZE ) injection 50 mcg (50 mcg Intravenous Given 08/12/24 2101)                                    Medical Decision Making Amount and/or Complexity of Data Reviewed Labs: ordered. Radiology: ordered.  Risk Prescription drug management.   Medical Decision Making:   Kye Silverstein is a 33 y.o. female who presented to the ED today with pelvic pain detailed above.    Additional history discussed with patient's family/caregivers.  External chart has been reviewed including previous imaging. Patient's  presentation is complicated by their history of IUD placement.  Complete initial physical exam performed, notably the patient  was alert and oriented in no apparent distress.  Lower abdomen/pelvic area is tender with greatest tenderness of the left lower quadrant..    Reviewed and confirmed nursing documentation for past medical history, family history, social history.    Initial Assessment:   With the patient's presentation of pelvic pain, with history of IUD misplacement, consider further sequelae of this including uterine rupture, also consider endometriosis, leiomyoma, ovarian cyst, ovarian torsion.   Initial Plan:  Obtain ultrasound of the pelvis  to obtain imaging of the pelvic organs and assess for potential ovarian torsion or other gynecologic etiology. Obtain CT of the abdomen to evaluate for possible intra-abdominal pathology. Screening labs including CBC and Metabolic panel to evaluate for infectious or metabolic etiology of disease.  Serum lipase added by nursing triage secondary to abdominal pain complaints. Manage pain with dose of ketorolac  Urinalysis with reflex culture ordered to evaluate for UTI or relevant urologic/nephrologic pathology.  Objective evaluation as below reviewed   Initial Study Results:   Laboratory  All laboratory results reviewed without evidence of clinically relevant pathology.   Exceptions include: Mild leukocytosis of 11.2.     Radiology:  All images reviewed independently. Agree with radiology report at this time.   CT ABDOMEN PELVIS W CONTRAST Result Date: 08/12/2024 CLINICAL DATA:  Pelvic pain EXAM: CT ABDOMEN AND PELVIS WITH CONTRAST TECHNIQUE: Multidetector CT imaging of the abdomen and pelvis was performed using the standard protocol following bolus administration of intravenous contrast. RADIATION DOSE REDUCTION: This exam was performed according to the departmental dose-optimization program which includes automated exposure control, adjustment  of the mA and/or kV according to patient size and/or use of iterative reconstruction technique. CONTRAST:  100mL OMNIPAQUE  IOHEXOL  300 MG/ML  SOLN COMPARISON:  Ultrasound from earlier in the same day. FINDINGS: Lower chest: No acute abnormality. Hepatobiliary: No focal liver abnormality is seen. No gallstones, gallbladder wall thickening, or biliary dilatation. Pancreas: Unremarkable. No pancreatic ductal dilatation or surrounding inflammatory changes. Spleen: Normal in size without focal abnormality. Adrenals/Urinary Tract: Adrenal glands are within normal limits. Kidneys demonstrate a normal enhancement pattern bilaterally. No renal calculi or obstructive changes are seen. The bladder is decompressed. Stomach/Bowel: No obstructive or inflammatory changes of the colon are seen. The appendix is within normal limits. Small bowel and stomach are unremarkable. Vascular/Lymphatic: No significant vascular findings are present. No enlarged abdominal or pelvic lymph nodes. Reproductive: Uterus is retroverted. IUD is again noted in place somewhat lower than that expected with a 90 degree rotation with respect to the endometrial canal. The left adnexal cyst is less well visualized on this exam. Other: No abdominal wall hernia or abnormality. No abdominopelvic ascites. Musculoskeletal: No acute or significant osseous findings. IMPRESSION: IUD is rotated within the uterus as described. No acute abnormality is noted. Electronically Signed   By: Oneil Devonshire M.D.   On: 08/12/2024 20:25   US  PELVIC COMPLETE W TRANSVAGINAL AND TORSION R/O Result Date: 08/12/2024 CLINICAL DATA:  Pelvic pain. EXAM: TRANSABDOMINAL AND TRANSVAGINAL ULTRASOUND OF PELVIS DOPPLER ULTRASOUND OF OVARIES TECHNIQUE: Both transabdominal and transvaginal ultrasound examinations of the pelvis were performed. Transabdominal technique was performed for global imaging of the pelvis including uterus, ovaries, adnexal regions, and pelvic cul-de-sac. It was  necessary to proceed with endovaginal exam following the transabdominal exam to visualize the endometrium and ovaries. Color and duplex Doppler ultrasound was utilized to evaluate blood flow to the ovaries. COMPARISON:  Ultrasound dated 06/20/2024. FINDINGS: Uterus Measurements: 8.4 x 4.6279 cm = volume: 119 mL. The uterus is retroverted. Endometrium Thickness: 2 mm. An intrauterine device is noted which is inferiorly displaced and located in the lower endometrium extending to the internal os. There is penetration of the arms of the IUD into the myometrium. Right ovary Measurements: 2.9 x 2.3 x 2.0 cm = volume: 6.8 mL. Normal appearance/no adnexal mass. Left ovary Measurements: 3.3 x 2.4 x 2.7 cm = volume: 11.5 mL. Significant interval decrease in the size of the left ovarian hemorrhagic cyst, now measuring 1.9 x 1.5  x 1.6 cm. Pulsed Doppler evaluation of both ovaries demonstrates normal low-resistance arterial and venous waveforms. Other findings No abnormal free fluid. IMPRESSION: 1. Inferiorly displaced IUD with myometrial penetration. 2. Interval decrease in the size of the left ovarian hemorrhagic cyst. Continued follow-up after 2 menstrual cycles recommended. Electronically Signed   By: Vanetta Chou M.D.   On: 08/12/2024 19:52      Consults: Case discussed with on-call OB/GYN..   Reassessment and Plan:   Discussed case with on-call OB/GYN, they recommend that she be brought in over the next week for an office removal of the IUD.  They will reach out to the patient directly, bring her in for device retrieval.  Given her pain management, initially her pain management was poor with ketorolac , so she was given a dose of IV fentanyl .  This did help alleviate her pain.  Given our discussion with OB/GYN, patient will be discharged with pain medication including oxycodone  and ibuprofen  to be alternated.  Further, this patient has been advised that if she has any further complications or pain to report  directly to [facility] emergency department for further evaluation as on-call states that she would be able to be evaluated by the on-call team there for potential direct intervention.  This was thoroughly explained to the patient, they understand and agree have no further concerns at this time.  Care plan at this time is for patient to be discharged with pain management and follow-up with OB/GYN as an outpatient visit unless she otherwise has any further complications including refractory pain and/or abnormal vaginal bleeding.       Final diagnoses:  Complication of intrauterine device (IUD), initial encounter    ED Discharge Orders          Ordered    oxyCODONE -acetaminophen  (PERCOCET/ROXICET) 5-325 MG tablet  Every 6 hours PRN        08/12/24 2133    ibuprofen  (ADVIL ) 600 MG tablet  Every 6 hours PRN        08/12/24 2133               Myriam Dorn BROCKS, [place] 08/12/24 2155    Emil Share, DO 08/12/24 2250

## 2024-08-12 NOTE — ED Notes (Signed)
 Urine obtained by Triage

## 2024-08-16 ENCOUNTER — Emergency Department (HOSPITAL_COMMUNITY)
Admission: EM | Admit: 2024-08-16 | Discharge: 2024-08-17 | Disposition: A | Discharge status: Home / Self Care | Attending: Emergency Medicine | Admitting: Emergency Medicine

## 2024-08-16 ENCOUNTER — Encounter (HOSPITAL_COMMUNITY): Payer: Self-pay | Admitting: Emergency Medicine

## 2024-08-16 ENCOUNTER — Other Ambulatory Visit: Payer: Self-pay

## 2024-08-16 DIAGNOSIS — N83202 Unspecified ovarian cyst, left side: Secondary | ICD-10-CM | POA: Diagnosis present

## 2024-08-16 DIAGNOSIS — Z7901 Long term (current) use of anticoagulants: Secondary | ICD-10-CM | POA: Insufficient documentation

## 2024-08-16 DIAGNOSIS — T8332XD Displacement of intrauterine contraceptive device, subsequent encounter: Secondary | ICD-10-CM | POA: Insufficient documentation

## 2024-08-16 DIAGNOSIS — T8332XS Displacement of intrauterine contraceptive device, sequela: Secondary | ICD-10-CM | POA: Insufficient documentation

## 2024-08-16 DIAGNOSIS — T8332XA Displacement of intrauterine contraceptive device, initial encounter: Secondary | ICD-10-CM | POA: Diagnosis not present

## 2024-08-16 DIAGNOSIS — Y828 Other medical devices associated with adverse incidents: Secondary | ICD-10-CM | POA: Diagnosis not present

## 2024-08-16 DIAGNOSIS — Z30432 Encounter for removal of intrauterine contraceptive device: Secondary | ICD-10-CM | POA: Diagnosis not present

## 2024-08-16 NOTE — ED Triage Notes (Signed)
 Pt reports she was seen at [facility] x 4 days ago for misplaced IUD, pt reports she has not been able to get in with OBGYN to have IUD removed, pt has had vaginal bleeding x 2 days and reports increased abd pain of 10/10, pt reports she is having to use panty liners, not saturating menstrual pads

## 2024-08-17 DIAGNOSIS — Z30432 Encounter for removal of intrauterine contraceptive device: Secondary | ICD-10-CM | POA: Diagnosis not present

## 2024-08-17 DIAGNOSIS — T8332XA Displacement of intrauterine contraceptive device, initial encounter: Secondary | ICD-10-CM | POA: Diagnosis not present

## 2024-08-17 DIAGNOSIS — N83202 Unspecified ovarian cyst, left side: Secondary | ICD-10-CM | POA: Diagnosis present

## 2024-08-17 MED ORDER — IBUPROFEN 400 MG PO TABS
600.0000 mg | ORAL_TABLET | Freq: Once | ORAL | Status: AC
  Administered 2024-08-17: 600 mg via ORAL
  Filled 2024-08-17: qty 1

## 2024-08-17 NOTE — Discharge Instructions (Signed)
 May have some mild bleeding after IUD removal. Need to use secondary birth control since IUD has been removed. Follow-up with Dr. Rutherford as scheduled. Return to the ED for new or worsening symptoms.

## 2024-08-17 NOTE — ED Notes (Signed)
 Pt is still complaining of excruciating abdominal pain states 10/10 worst pain she ever felt

## 2024-08-17 NOTE — ED Notes (Signed)
 Pt name called for updated vitals, parents stated pt walked out to car. When recheck vitals once she is back.

## 2024-08-17 NOTE — Consult Note (Signed)
 Impression: Principal Problem:   Malpositioned IUD, initial encounter Active Problems:   Hemorrhagic cyst of left ovary  Recommendations: - IUD removed - follow up with her OBGYN for management of the ovarian cyst and for contraception   Reason for consult: Rachel Sanders is a 33 y.o. [id number] female who is being evaluated in the [facility] ED for displaced IUD, abdominal pain, and vaginal bleeding.   Chart review finds L hemorrhagic cyst and malpositioned IUD since 05/2024. Patient presented to [facility] ED earlier this week for abdominal pain. After imaging, they contacted OBGYN who recommended outpatient follow up.   Today, patient states she was told by [facility] ED to present to [facility] for management of IUD and hysteroscopy. She was previously a patient of [facility] but states she could not be seen by them until next year. She plans to establish care with Dr. Rutherford for management of her cyst.   I discussed with patient that removing the IUD may not resolve the pain given that she still has ovarian cyst. She still desires the IUD to be removed. I offered alternate contraception, and patient states she wants another IUD. She declines waiting until outpatient OBGYN to have IUD removed and replaced the same day. States she is not sexually active currently, so contraception will not be an issue in the interim.   Exam BP 119/75 (BP Location: Right Arm)   Pulse 94   Temp 98.3 F (36.8 C) (Oral)   Resp 16   Ht 5' 5 (1.651 m)   Wt 78.5 kg   LMP 08/15/2024 (Exact Date)   SpO2 99%   BMI 28.79 kg/m   Physical Examination: General appearance - alert, well appearing, and in no distress Abdomen - soft, nontender, nondistended, no masses or organomegaly Pelvic - VULVA: normal appearing vulva with no masses, tenderness or lesions, VAGINA: normal appearing vagina with scant blood, normal color and discharge, no lesions, CERVIX: lesions absent, cervical discharge present - normal-appearing mucus and  scant blood, cervical motion tenderness absent, multiparous os.   Single tooth tenaculum was applied to the anterior lip of the cervix. IUD strings not present through the os, small swab used to sweep the cervical canal, end of strings were visualized and grasped with ring forceps. IUD was removed easily; white device with dark color strings appears to be Mirena. Exam chaperoned by Hunter FALCON, RN.   Labs: No results found for this or any previous visit (from the past 24 hours).  Radiological Studies CT ABDOMEN PELVIS W CONTRAST-- 08/12/2024 Reproductive: Uterus is retroverted. IUD is again noted in place somewhat lower than that expected with a 90 degree rotation with respect to the endometrial canal. The left adnexal cyst is less well visualized on this exam.  IMPRESSION: IUD is rotated within the uterus as described. No acute abnormality is noted.   US  PELVIC COMPLETE W TRANSVAGINAL AND TORSION R/O-- 08/12/2024 IMPRESSION: 1. Inferiorly displaced IUD with myometrial penetration. 2. Interval decrease in the size of the left ovarian hemorrhagic cyst. Continued follow-up after 2 menstrual cycles recommended.  US  PELVIC COMPLETE W TRANSVAGINAL AND TORSION R/O-- 06/20/2024 IMPRESSION: 1. Malpositioned low-lying intrauterine device with 8 mm posterior myometrial penetration. Alternative means of contraception advised. 2. Left ovarian 5.2 cm hemorrhagic cyst. Follow-up transvaginal pelvic ultrasound recommended in 6-12 weeks to document resolution. 3. Small volume free fluid in the pelvic cul-de-sac and left adnexa. 4. No evidence of adnexal torsion.  CT ABDOMEN PELVIS W CONTRAST-- 06/20/2024 IMPRESSION: 1. Complex cystic structure in the  left adnexa. Ultrasound is recommended for further evaluation. 2. IUD in the uterus and the arms may be myometrial in location, which may be better evaluated on ultrasound. 3. Mild bladder wall thickening, which may be infectious  or inflammatory.    Thank you so much for allowing us  to participate in the care of this patient. Please call the attending OB/GYN physician with questions or concerns at [phone] M-F 8a-5p, after hours and on weekends, we can be reached at [phone].  Barabara Maier, DO FMOB Fellow, Faculty Practice [facility], Center for [employer]

## 2024-08-17 NOTE — ED Provider Notes (Signed)
 [place] EMERGENCY DEPARTMENT AT [facility] Provider Note   CSN: [id number] Arrival date & time: 08/16/24  2110     Patient presents with: IUD misplaced   Yanelly Cantrelle is a 33 y.o. female.   The history is provided by the patient and medical records.   33 year old female with history of obesity, anemia, history of PE on Eliquis , presenting to the ED for complications with her IUD.  She was seen in the ED 5 days ago for same and had CT and ultrasound which showed a displaced IUD with some myometrial penetration.  States OB was called at that visit and she was directed to follow-up outpatient, however was told that they did not have any appointments available for her.  States she had some increased abdominal pain and new vaginal bleeding that began today.  Denies any passage of clots.  States she is wearing a pad and is not soaking through this.  No nausea or vomiting.  No fever.  IUD was placed November 2024 by [facility].  She is supposed to be following up with new OB/GYN, Dr. Rutherford, reagrdingbut has not yet seen her.  Prior to Admission medications   Medication Sig Start Date End Date Taking? Authorizing Provider  apixaban  (ELIQUIS ) 5 MG TABS tablet Take 1 tablet (5 mg total) by mouth 2 (two) times daily. 01/04/23 03/28/24  Bernie Guillermina BROCKS, MD  cyclobenzaprine  (FLEXERIL ) 5 MG tablet Take 1 tablet (5 mg total) by mouth 3 (three) times daily as needed for muscle spasms. 12/20/23   Geiple, Joshua, PA-C  ibuprofen  (ADVIL ) 600 MG tablet Take 1 tablet (600 mg total) by mouth every 6 (six) hours as needed. 08/12/24   Myriam Dorn BROCKS, PA  levonorgestrel (MIRENA, 52 MG,) 20 MCG/DAY IUD 1 applicator by Intrauterine route daily.    [provider]  lidocaine  (LIDODERM ) 5 % Place 1 patch onto the skin daily. Remove & Discard patch within 12 hours or as directed by MD Patient taking differently: Place 1 patch onto the skin daily as needed (Pain). Remove & Discard  patch within 12 hours or as directed by MD 11/08/22   Jamison Rosaline LABOR, DO  oxyCODONE -acetaminophen  (PERCOCET/ROXICET) 5-325 MG tablet Take 2 tablets by mouth every 6 (six) hours as needed for up to 5 days for severe pain (pain score 7-10). 08/12/24 08/17/24  Myriam Dorn BROCKS, PA    Allergies: Patient has no known allergies.    Review of Systems  Genitourinary:  Positive for pelvic pain.       IUD problem  All other systems reviewed and are negative.   Updated Vital Signs BP 119/75 (BP Location: Right Arm)   Pulse 94   Temp 98.3 F (36.8 C) (Oral)   Resp 16   Ht 5' 5 (1.651 m)   Wt 78.5 kg   LMP 08/15/2024 (Exact Date)   SpO2 99%   BMI 28.79 kg/m   Physical Exam Vitals and nursing note reviewed.  Constitutional:      Appearance: She is well-developed.  HENT:     Head: Normocephalic and atraumatic.  Eyes:     Conjunctiva/sclera: Conjunctivae normal.     Pupils: Pupils are equal, round, and reactive to light.  Cardiovascular:     Rate and Rhythm: Normal rate and regular rhythm.     Heart sounds: Normal heart sounds.  Pulmonary:     Effort: Pulmonary effort is normal.     Breath sounds: Normal breath sounds.  Abdominal:  General: Bowel sounds are normal.     Palpations: Abdomen is soft.     Tenderness: There is no abdominal tenderness.     Comments: Soft, no elicited tenderness or peritoneal signs  Musculoskeletal:        General: Normal range of motion.     Cervical back: Normal range of motion.  Skin:    General: Skin is warm and dry.  Neurological:     Mental Status: She is alert and oriented to person, place, and time.     (all labs ordered are listed, but only abnormal results are displayed) Labs Reviewed - No data to display  EKG: None  Radiology: No results found.   Procedures   Medications Ordered in the ED - No data to display                                  Medical Decision Making  33 year old female presenting to the ED with  vaginal bleeding and worsening pelvic pain.  Has a malpositioned IUD, evaluated in the ED a few days ago for same.  States has been unable to follow-up with outpatient GYN to have this removed.  She is afebrile and nontoxic in appearance here.  She does not have any abdominal tenderness on exam, she is not peritoneal.  I have reviewed her CT and ultrasound from a few days ago, does have malpositioned IUD, somewhat embedded in the myometrium.  Discussed case with OB/GYN, they were able to come to ED and remove this here.  Patient tolerated well.  See their separate procedure note.  Patient is stable for discharge.  Advised to use backup birth control since IUD has been removed.  Can follow-up with Dr. Rutherford as scheduled.  Can return here for new concerns  Final diagnoses:  IUD migration, sequela    ED Discharge Orders     None          Jarold Olam HERO, PA-C 08/17/24 0552    Emil Share, DO 08/17/24 [phone]

## 2024-08-17 NOTE — ED Notes (Signed)
OB/GYN at the bedside.
